# Patient Record
Sex: Female | Born: 1950 | Race: White | Hispanic: Refuse to answer | Smoking: Former smoker
Health system: Southern US, Community
[De-identification: ages and names within clinical notes are randomized; demographics above are authoritative.]

## PROBLEM LIST (undated history)

## (undated) DIAGNOSIS — I10 Essential (primary) hypertension: Secondary | ICD-10-CM

## (undated) DIAGNOSIS — R519 Headache, unspecified: Secondary | ICD-10-CM

## (undated) DIAGNOSIS — F329 Major depressive disorder, single episode, unspecified: Secondary | ICD-10-CM

## (undated) DIAGNOSIS — Z8719 Personal history of other diseases of the digestive system: Secondary | ICD-10-CM

## (undated) DIAGNOSIS — K219 Gastro-esophageal reflux disease without esophagitis: Secondary | ICD-10-CM

## (undated) DIAGNOSIS — G8929 Other chronic pain: Secondary | ICD-10-CM

## (undated) DIAGNOSIS — M8580 Other specified disorders of bone density and structure, unspecified site: Secondary | ICD-10-CM

## (undated) DIAGNOSIS — E669 Obesity, unspecified: Secondary | ICD-10-CM

## (undated) DIAGNOSIS — F32A Depression, unspecified: Secondary | ICD-10-CM

## (undated) DIAGNOSIS — G43909 Migraine, unspecified, not intractable, without status migrainosus: Secondary | ICD-10-CM

## (undated) DIAGNOSIS — E785 Hyperlipidemia, unspecified: Secondary | ICD-10-CM

## (undated) DIAGNOSIS — R51 Headache: Secondary | ICD-10-CM

## (undated) DIAGNOSIS — I480 Paroxysmal atrial fibrillation: Secondary | ICD-10-CM

## (undated) DIAGNOSIS — K5790 Diverticulosis of intestine, part unspecified, without perforation or abscess without bleeding: Secondary | ICD-10-CM

## (undated) DIAGNOSIS — Z9289 Personal history of other medical treatment: Secondary | ICD-10-CM

## (undated) DIAGNOSIS — I5189 Other ill-defined heart diseases: Secondary | ICD-10-CM

## (undated) HISTORY — PX: UPPER GASTROINTESTINAL ENDOSCOPY: SHX188

## (undated) HISTORY — DX: Essential (primary) hypertension: I10

## (undated) HISTORY — PX: COLONOSCOPY: SHX174

## (undated) HISTORY — DX: Migraine, unspecified, not intractable, without status migrainosus: G43.909

## (undated) HISTORY — PX: EYE SURGERY: SHX253

## (undated) HISTORY — DX: Other ill-defined heart diseases: I51.89

## (undated) HISTORY — DX: Depression, unspecified: F32.A

## (undated) HISTORY — DX: Hyperlipidemia, unspecified: E78.5

## (undated) HISTORY — PX: LAPAROSCOPIC NISSEN FUNDOPLICATION: SHX1932

## (undated) HISTORY — DX: Diverticulosis of intestine, part unspecified, without perforation or abscess without bleeding: K57.90

## (undated) HISTORY — PX: CATARACT EXTRACTION, BILATERAL: SHX1313

## (undated) HISTORY — DX: Personal history of other medical treatment: Z92.89

## (undated) HISTORY — PX: TUBAL LIGATION: SHX77

## (undated) HISTORY — DX: Other chronic pain: G89.29

## (undated) HISTORY — DX: Other specified disorders of bone density and structure, unspecified site: M85.80

## (undated) HISTORY — DX: Headache, unspecified: R51.9

## (undated) HISTORY — DX: Major depressive disorder, single episode, unspecified: F32.9

## (undated) HISTORY — DX: Headache: R51

## (undated) HISTORY — DX: Gastro-esophageal reflux disease without esophagitis: K21.9

## (undated) HISTORY — PX: ROTATOR CUFF REPAIR: SHX139

## (undated) HISTORY — DX: Paroxysmal atrial fibrillation: I48.0

## (undated) HISTORY — DX: Personal history of other diseases of the digestive system: Z87.19

## (undated) HISTORY — PX: TONSILLECTOMY: SUR1361

## (undated) HISTORY — PX: ELBOW SURGERY: SHX618

## (undated) HISTORY — DX: Obesity, unspecified: E66.9

---

## 1998-10-07 ENCOUNTER — Other Ambulatory Visit: Admission: RE | Admit: 1998-10-07 | Discharge: 1998-10-07 | Payer: Self-pay | Admitting: Obstetrics and Gynecology

## 1998-10-18 ENCOUNTER — Other Ambulatory Visit: Admission: RE | Admit: 1998-10-18 | Discharge: 1998-10-18 | Payer: Self-pay | Admitting: Obstetrics and Gynecology

## 2000-11-09 ENCOUNTER — Other Ambulatory Visit: Admission: RE | Admit: 2000-11-09 | Discharge: 2000-11-09 | Payer: Self-pay | Admitting: Obstetrics and Gynecology

## 2002-09-12 ENCOUNTER — Other Ambulatory Visit: Admission: RE | Admit: 2002-09-12 | Discharge: 2002-09-12 | Payer: Self-pay | Admitting: Radiology

## 2002-11-06 ENCOUNTER — Other Ambulatory Visit: Admission: RE | Admit: 2002-11-06 | Discharge: 2002-11-06 | Payer: Self-pay | Admitting: Obstetrics and Gynecology

## 2003-09-29 HISTORY — PX: KNEE ARTHROSCOPY: SHX127

## 2004-07-16 ENCOUNTER — Other Ambulatory Visit: Admission: RE | Admit: 2004-07-16 | Discharge: 2004-07-16 | Payer: Self-pay | Admitting: Obstetrics and Gynecology

## 2005-07-30 ENCOUNTER — Other Ambulatory Visit: Admission: RE | Admit: 2005-07-30 | Discharge: 2005-07-30 | Payer: Self-pay | Admitting: Obstetrics and Gynecology

## 2006-09-23 ENCOUNTER — Ambulatory Visit: Payer: Self-pay | Admitting: Gastroenterology

## 2006-09-28 HISTORY — PX: HERNIA REPAIR: SHX51

## 2006-10-14 ENCOUNTER — Encounter (INDEPENDENT_AMBULATORY_CARE_PROVIDER_SITE_OTHER): Payer: Self-pay | Admitting: Specialist

## 2006-10-14 ENCOUNTER — Ambulatory Visit: Payer: Self-pay | Admitting: Gastroenterology

## 2006-10-14 ENCOUNTER — Encounter (INDEPENDENT_AMBULATORY_CARE_PROVIDER_SITE_OTHER): Payer: Self-pay | Admitting: *Deleted

## 2007-01-21 ENCOUNTER — Encounter: Admission: RE | Admit: 2007-01-21 | Discharge: 2007-01-21 | Payer: Self-pay | Admitting: Internal Medicine

## 2007-09-13 ENCOUNTER — Ambulatory Visit: Payer: Self-pay | Admitting: Internal Medicine

## 2007-10-19 ENCOUNTER — Ambulatory Visit: Payer: Self-pay | Admitting: Internal Medicine

## 2007-10-21 ENCOUNTER — Ambulatory Visit (HOSPITAL_COMMUNITY): Admission: RE | Admit: 2007-10-21 | Discharge: 2007-10-21 | Payer: Self-pay | Admitting: Internal Medicine

## 2008-01-05 ENCOUNTER — Inpatient Hospital Stay (HOSPITAL_COMMUNITY): Admission: RE | Admit: 2008-01-05 | Discharge: 2008-01-07 | Payer: Self-pay | Admitting: Surgery

## 2008-07-20 ENCOUNTER — Ambulatory Visit: Payer: Self-pay | Admitting: Internal Medicine

## 2008-10-25 ENCOUNTER — Ambulatory Visit: Payer: Self-pay | Admitting: Internal Medicine

## 2008-12-11 ENCOUNTER — Ambulatory Visit: Payer: Self-pay | Admitting: Internal Medicine

## 2009-03-23 ENCOUNTER — Emergency Department (HOSPITAL_COMMUNITY): Admission: EM | Admit: 2009-03-23 | Discharge: 2009-03-23 | Payer: Self-pay | Admitting: Emergency Medicine

## 2009-06-06 ENCOUNTER — Encounter: Payer: Self-pay | Admitting: Obstetrics and Gynecology

## 2009-06-06 ENCOUNTER — Ambulatory Visit: Payer: Self-pay | Admitting: Obstetrics and Gynecology

## 2009-06-06 ENCOUNTER — Other Ambulatory Visit: Admission: RE | Admit: 2009-06-06 | Discharge: 2009-06-06 | Payer: Self-pay | Admitting: Obstetrics and Gynecology

## 2009-07-12 ENCOUNTER — Ambulatory Visit: Payer: Self-pay | Admitting: Internal Medicine

## 2009-08-26 ENCOUNTER — Encounter: Admission: RE | Admit: 2009-08-26 | Discharge: 2009-09-25 | Payer: Self-pay | Admitting: Cardiology

## 2009-09-03 ENCOUNTER — Telehealth: Payer: Self-pay | Admitting: Internal Medicine

## 2009-09-05 ENCOUNTER — Ambulatory Visit: Payer: Self-pay | Admitting: Internal Medicine

## 2009-09-05 LAB — CONVERTED CEMR LAB
ALT: 29 units/L (ref 0–35)
AST: 29 units/L (ref 0–37)
Alkaline Phosphatase: 28 units/L — ABNORMAL LOW (ref 39–117)
BUN: 19 mg/dL (ref 6–23)
Basophils Absolute: 0 10*3/uL (ref 0.0–0.1)
Basophils Relative: 0.7 % (ref 0.0–3.0)
Calcium: 9.5 mg/dL (ref 8.4–10.5)
Chloride: 105 meq/L (ref 96–112)
Creatinine, Ser: 0.7 mg/dL (ref 0.4–1.2)
Eosinophils Absolute: 0.3 10*3/uL (ref 0.0–0.7)
Hemoglobin: 12.9 g/dL (ref 12.0–15.0)
Lymphocytes Relative: 34.4 % (ref 12.0–46.0)
MCHC: 34.5 g/dL (ref 30.0–36.0)
MCV: 94.6 fL (ref 78.0–100.0)
Monocytes Absolute: 0.5 10*3/uL (ref 0.1–1.0)
Neutro Abs: 3.8 10*3/uL (ref 1.4–7.7)
RDW: 12.2 % (ref 11.5–14.6)
Total Bilirubin: 1.4 mg/dL — ABNORMAL HIGH (ref 0.3–1.2)

## 2009-09-09 ENCOUNTER — Ambulatory Visit (HOSPITAL_COMMUNITY): Admission: RE | Admit: 2009-09-09 | Discharge: 2009-09-09 | Payer: Self-pay | Admitting: Internal Medicine

## 2010-04-16 ENCOUNTER — Ambulatory Visit: Payer: Self-pay | Admitting: Internal Medicine

## 2010-06-03 ENCOUNTER — Ambulatory Visit: Payer: Self-pay | Admitting: Internal Medicine

## 2010-06-12 ENCOUNTER — Ambulatory Visit: Payer: Self-pay | Admitting: Cardiology

## 2010-06-27 ENCOUNTER — Ambulatory Visit: Payer: Self-pay | Admitting: Obstetrics and Gynecology

## 2010-06-27 ENCOUNTER — Other Ambulatory Visit: Admission: RE | Admit: 2010-06-27 | Discharge: 2010-06-27 | Payer: Self-pay | Admitting: Obstetrics and Gynecology

## 2010-08-26 ENCOUNTER — Ambulatory Visit: Payer: Self-pay | Admitting: Internal Medicine

## 2010-12-11 ENCOUNTER — Ambulatory Visit: Payer: Self-pay | Admitting: Nurse Practitioner

## 2011-01-05 ENCOUNTER — Telehealth: Payer: Self-pay | Admitting: Cardiology

## 2011-01-05 NOTE — Telephone Encounter (Signed)
PT NEEDS A STMT SHOWING HOW MUCH SHE HAS PAID. NEEDS IT FOR TAX PURPOSES. CALL PT AT CELL # LISTED AND LET HER KNOW WHEN READY. SHE WANTS TO PICK IT UP.

## 2011-01-05 NOTE — Telephone Encounter (Signed)
Spoke to patient and advised tax info is $50.00 for NMG 2011.  She will need to contact [facility] for 04/28/2010 to present.  Patient ok, will pick up on appt 01/13/2011.

## 2011-01-07 ENCOUNTER — Encounter: Payer: Self-pay | Admitting: Nurse Practitioner

## 2011-01-12 ENCOUNTER — Encounter: Payer: Self-pay | Admitting: *Deleted

## 2011-01-12 ENCOUNTER — Other Ambulatory Visit: Payer: Self-pay | Admitting: *Deleted

## 2011-01-12 DIAGNOSIS — E7800 Pure hypercholesterolemia, unspecified: Secondary | ICD-10-CM

## 2011-01-13 ENCOUNTER — Encounter: Payer: Self-pay | Admitting: Nurse Practitioner

## 2011-01-13 ENCOUNTER — Ambulatory Visit (INDEPENDENT_AMBULATORY_CARE_PROVIDER_SITE_OTHER): Payer: BC Managed Care – PPO | Admitting: Nurse Practitioner

## 2011-01-13 ENCOUNTER — Other Ambulatory Visit (INDEPENDENT_AMBULATORY_CARE_PROVIDER_SITE_OTHER): Payer: BC Managed Care – PPO | Admitting: *Deleted

## 2011-01-13 DIAGNOSIS — I480 Paroxysmal atrial fibrillation: Secondary | ICD-10-CM

## 2011-01-13 DIAGNOSIS — E785 Hyperlipidemia, unspecified: Secondary | ICD-10-CM

## 2011-01-13 DIAGNOSIS — I4891 Unspecified atrial fibrillation: Secondary | ICD-10-CM

## 2011-01-13 DIAGNOSIS — E7800 Pure hypercholesterolemia, unspecified: Secondary | ICD-10-CM

## 2011-01-13 DIAGNOSIS — E669 Obesity, unspecified: Secondary | ICD-10-CM

## 2011-01-13 DIAGNOSIS — I10 Essential (primary) hypertension: Secondary | ICD-10-CM

## 2011-01-13 LAB — LIPID PANEL
Cholesterol: 181 mg/dL (ref 0–200)
HDL: 60.3 mg/dL (ref >39.00)
LDL Cholesterol: 94 mg/dL (ref 0–99)
Total CHOL/HDL Ratio: 3
Triglycerides: 133 mg/dL (ref 0.0–149.0)
VLDL: 26.6 mg/dL (ref 0.0–40.0)

## 2011-01-13 LAB — BASIC METABOLIC PANEL
BUN: 16 mg/dL (ref 6–23)
CO2: 29 mEq/L (ref 19–32)
Calcium: 9.3 mg/dL (ref 8.4–10.5)
Chloride: 107 mEq/L (ref 96–112)
Creatinine, Ser: 0.6 mg/dL (ref 0.4–1.2)
GFR: 100.61 mL/min (ref >60.00)
Glucose, Bld: 91 mg/dL (ref 70–99)
Potassium: 4.7 mEq/L (ref 3.5–5.1)
Sodium: 143 mEq/L (ref 135–145)

## 2011-01-13 LAB — HEPATIC FUNCTION PANEL
ALT: 22 U/L (ref 0–35)
AST: 23 U/L (ref 0–37)
Albumin: 3.7 g/dL (ref 3.5–5.2)
Alkaline Phosphatase: 35 U/L — ABNORMAL LOW (ref 39–117)
Bilirubin, Direct: 0.1 mg/dL (ref 0.0–0.3)
Total Bilirubin: 1 mg/dL (ref 0.3–1.2)
Total Protein: 6.5 g/dL (ref 6.0–8.3)

## 2011-01-13 NOTE — Assessment & Plan Note (Signed)
Blood pressure looks good and is good at home. She will continue with her current medicines.

## 2011-01-13 NOTE — Patient Instructions (Signed)
Stay on your same medicines. I encourage water exercise. Monitor blood pressure at home. We will see you back in 6 months. I will have you see Dr. Jens Som.

## 2011-01-13 NOTE — Assessment & Plan Note (Signed)
She is gaining weight. I encouraged water exercise.

## 2011-01-13 NOTE — Assessment & Plan Note (Signed)
No recurrence. Will continue to manage medically.

## 2011-01-13 NOTE — Progress Notes (Signed)
[name] Date of Birth: [birth year]   History of Present Illness: Olivia Reynolds is seen today for her 6 month visit. She is doing well. Unfortunately, she developed plantar fasciitis and has not been able to exercise for the past 4 months. Her weight is going back up. Her blood pressure is good at home. She has not had recurrent atrial fib. She has weaned off her Welbutrin and is now on low dose Zoloft.  Current Outpatient Prescriptions on File Prior to Visit  Medication Sig Dispense Refill  . ALPRAZolam (XANAX) 0.25 MG tablet Take 0.25 mg by mouth at bedtime as needed.        Marland Kitchen aspirin 81 MG tablet Take 81 mg by mouth daily.        Marland Kitchen losartan (COZAAR) 50 MG tablet Take 50 mg by mouth daily.        . metoprolol (TOPROL-XL) 50 MG 24 hr tablet Take 50 mg by mouth 2 (two) times daily.        . rosuvastatin (CRESTOR) 10 MG tablet Take 10 mg by mouth daily.        . sertraline (ZOLOFT) 50 MG tablet Take 50 mg by mouth daily.        . Wheat Dextrin (BENEFIBER DRINK MIX PO) Take by mouth daily.        Marland Kitchen buPROPion (WELLBUTRIN XL) 150 MG 24 hr tablet Take 150 mg by mouth daily.        . Calcium Carbonate (CALTRATE 600 PO) Take by mouth daily.        Marland Kitchen DISCONTD: valsartan (DIOVAN) 160 MG tablet Take 160 mg by mouth daily.          Allergies  Allergen Reactions  . Codeine   . Erythromycin   . Statins     Past Medical History  Diagnosis Date  . PAF (paroxysmal atrial fibrillation)   . Hyperlipidemia   . HTN (hypertension)   . Aortic valve calcification     Mild per echo in February of 2010  . Obesity   . History of hiatal hernia     with repair  . Ventricular hypertrophy     (L) Mild  . Diverticulosis   . Chronic headaches   . Depression   . Atrial fibrillation   . GERD (gastroesophageal reflux disease)     Past Surgical History  Procedure Date  . Hernia repair   . Eye surgery     L-yey myopia  . Knee arthroscopy 2005    R-knee  . Elbow surgery     Tendon Surgery  . Tubal  ligation   . Tonsillectomy     History  Smoking status  . Never Smoker   Smokeless tobacco  . Never Used    History  Alcohol Use  . Yes    once a month    Family History  Problem Relation Age of Onset  . COPD Mother   . Asthma Mother   . Arthritis Father   . Hypertension Father   . Stroke Father   . Cancer Father   . Diabetes      sibling    Review of Systems: The review of systems is positive for foot pain. She has had 2 injections with poor relief. Weight is going up. No chest pain or shortness of breath is noted. She does have a tendency towards depression.  All other systems were reviewed and are negative.  Physical Exam: BP 120/82  Pulse 70  Ht  5\' 5"  (1.651 m)  Wt 175 lb (79.379 kg)  BMI 29.12 kg/m2 Patient is very pleasant and in no acute distress. Skin is warm and dry. Color is normal.  HEENT is unremarkable. Normocephalic/atraumatic. PERRL. Sclera are nonicteric. Neck is supple. No masses. No JVD. Lungs are clear. Cardiac exam shows a regular rate and rhythm. Abdomen is soft. Extremities are without edema. Gait and ROM are intact. No gross neurologic deficits noted.  LABORATORY DATA:   Assessment / Plan:

## 2011-01-15 ENCOUNTER — Telehealth: Payer: Self-pay | Admitting: *Deleted

## 2011-01-15 NOTE — Telephone Encounter (Signed)
Message copied by Barnetta Hammersmith on Thu Jan 15, 2011  8:24 AM ------      Message from: Norma Fredrickson      Created: Tue Jan 13, 2011  3:57 PM       Ok to report. Labs are satisfactory. Recheck in 6 months.

## 2011-01-15 NOTE — Telephone Encounter (Signed)
Pt notified of lab results and to continue same medications.  Pt will f/u with Dr. Crenshaw in six months with labs.   

## 2011-01-28 ENCOUNTER — Other Ambulatory Visit: Payer: Self-pay | Admitting: *Deleted

## 2011-01-28 DIAGNOSIS — E785 Hyperlipidemia, unspecified: Secondary | ICD-10-CM

## 2011-01-28 MED ORDER — ROSUVASTATIN CALCIUM 10 MG PO TABS: 10.0000 mg | ORAL_TABLET | Freq: Every day | ORAL | Status: DC

## 2011-02-10 NOTE — Op Note (Signed)
NAMEEUSEBIA, GRULKE               ACCOUNT NO.:  [id number]   MEDICAL RECORD NO.:  [id number]          PATIENT TYPE:  INP   LOCATION:  1525                         FACILITY:  [facility]   PHYSICIAN:  Thornton Park. Daphine Deutscher, MD  DATE OF BIRTH:  10/10/1950   DATE OF PROCEDURE:  01/05/2008  DATE OF DISCHARGE:                               OPERATIVE REPORT   PREOPERATIVE DIAGNOSIS:  Large type 3 mixed hiatal hernia containing  approximately half of the stomach.   POSTOPERATIVE DIAGNOSIS:  Gastroesophageal reflux disease with large  type 3 mixed hiatal hernia containing about half of the stomach.   PROCEDURE:  Laparoscopic takedown of gastric incarceration and hiatal  hernia, repair of diaphragm with five pledgeted sutures posteriorly and  onlay of Surgisis mesh sutured in place around the defect, Nissen  fundoplication over a #56 lighted bougie dilator.   SURGEON:  Thornton Park. Daphine Deutscher, M.D.   ASSISTANT:  Sharlet Salina T. Hoxworth, M.D.   ANESTHESIA:  General endotracheal anesthesia.   ESTIMATED BLOOD LOSS:  Minimal.   DESCRIPTION OF PROCEDURE:  Ms. Keirstyn Olivia Reynolds is a 60 year old white  female who has had reflux and pressure symptoms in her chest and was  found to have a large hiatal hernia.  She was taken to room 1 and given  general anesthesia.  The abdomen was prepped with TechniCare and draped  sterilely.  Access to the abdomen was gained through the left upper  quadrant with the OptiVu technique.  This entered into the abdomen and  was insufflated.  A total of six trocar sites were used.  I had to take  down some adhesions to the left anterior abdominal wall lateral to this  trocar placement to allow me to insert a 5 mm trocar.  The hernia was  visualized and contained, as demonstrated on the upper GI, about half  the stomach.  We were able to grasp this and bring it down into the  abdomen and in so doing, I went ahead and started along the greater  curvature and with the Harmonic scalpel,  divided the peritoneal  attachments to the hernia sac.  I also freed the stomach from the  spleen.  There was one large splenic vessel that I double clipped and  divided with the harmonic.  I worked my way up into the chest to the  esophagus.   Next, I began on the patient's right side and defined the right crural  margin and, again, took the hernia sac and then resected it with the  specimen.  I identified the posterior vagal nerve and I did not get down  that deep anteriorly to sacrifice the anterior vagal nerve.  I feel like  the vagal nerves were preserved.  A Penrose drain was then placed about  the esophagus which appeared to have a good length.  Dr. Johna Sheriff  introduced the endoscope from above and this enabled [place] to validate the  anatomy and her anatomic impressions which were correct in terms of the  EG junction   At that point, we began repair posteriorly with a total of five  pledgeted  sutures using a combination Endostitch and free stitches,  tying them some with extracorporeal ties and also some with tie knots.  This remarkably reduced the hiatal hernia defect to the point where it  was snug around the esophagus with a 56 in place.  Once we had this nice  closure there, I then went ahead and got a piece of the Surgisis mesh  that is cut for the diaphragm and is a hiatal patch.  It is cut  asymmetrically with the larger side on the patient's right side.  This  was then placed around the distal esophagus and I actually cut it a  little bit more so that it would overlap anteriorly.  I sutured it  together and then sutured it to the diaphragm superiorly and tacked it  on both the right and left sides and tacked it up anteriorly laterally  as well, so it was secured in five places to the diaphragm being secured  to itself.   I then reached around and grabbed the cardia and Dr. Rica Mast passed a 56  lighted bougie and around that we invaginated the distal esophagus with  the  wrapped stomach, suturing it to the esophagus, again with an  Endostitch and then two free sutures, again all three with tie knots.  The wrap looked very good.  I put Tisseel beneath it and removed the 56  lighted bougie without difficulty.  We had seen a little bleeding from  the left lateral segment which we held some pressure on, but that was  very minimal.  The area was surveyed and everything looked to be in  order.  We deflated the abdomen and removed the trocars and  removed the  Nathanson retractor from the liver and injected all the ports with  Marcaine and closed them with 4-0 Vicryls.  The skin was closed with  Dermabond.  The patient tolerated the procedure well and was taken to  the recovery room in satisfactory condition.      Thornton Park Daphine Deutscher, MD  Electronically Signed     MBM/MEDQ  D:  01/05/2008  T:  01/05/2008  Job:  [id number]   cc:   Iva Boop, MD,FACG  [facility]  [address] [place], [place] [address]   Luanna Cole. Lenord Fellers, M.D.  Fax: [phone]

## 2011-02-10 NOTE — Assessment & Plan Note (Signed)
[facility]                         GASTROENTEROLOGY OFFICE NOTE   NAME:Olivia Reynolds, Olivia Reynolds                      MRN:          [id number]  DATE:09/13/2007                            DOB:          [birth year]    REFERRING PHYSICIAN:  Colleen Can. Deborah Chalk, M.D.   PRIMARY CARE Myiesha Edgar:  Luanna Cole. Lenord Fellers, M.D.   REASON FOR CONSULTATION:  Hiatal hernia.   ASSESSMENT:  It sounds like she has a large hiatal hernia which may be  causing some chest pressure.  She saw Dr. Deborah Chalk for chest pressure and  had some cardiac testing in the monitor, and apparently she had some  transient atrial fibrillation.  He noted on x-ray or perhaps an  echocardiogram that she had a large hiatus hernia.  She has never had  dysphagia or significant indigestion.  She describes a postprandial-like  pressure sensation at times, but it is not always associated with  eating.   I think it is very possible that a large hiatal hernia could be causing  problems.  I do not have any direct imaging to look at with respect to  this.   PLAN:  Proceed to upper GI endoscopy, and then also an upper GI series  may be necessary.  If indeed she has a very large hiatus hernia, that  may need surgical correction.  Interestingly, her mother had this  problem and became more symptomatic as she grew older and then became  too old to have surgery.   I have explained the risks, benefits, and indications of the procedure  to the patient.  She understands and agrees to proceed.   HISTORY:  This is a 60 year old white woman with problems as outlined  above.  She had some chest tightness and pressure and had palpitations  and dizziness.  Workup as described above, though I do not have those  exact records.  The symptoms last for about a half hour or 45 minutes.  GI review of systems otherwise negative.   PAST MEDICAL HISTORY:  1. Hypertension.  2. She is on Protonix for gastroesophageal reflux disease, so  she does      not have heartburn now.  3. Colonoscopy, October 14, 2006.  A 5 mm hyperplastic polyp removed      by Dr. Corinda Gubler.  Also showing diverticulosis.  Previous      colonoscopy, April 2004, showing a 7 mm sessile polyp and a 2 mm      sessile polyp.  The 7 mm polyp was removed.  She also had the      diverticulosis.  I do not have the pathology from that report.  4. Overweight.  5. Chronic headaches.  6. Depression.  7. Atrial fibrillation, apparently paroxysmal.  8. Dyslipidemia.  9. Prior tubal ligation.   FAMILY HISTORY:  Prostate cancer in her father.  Mother had heart  disease.  Her father and siblings have had colon polyps.  She has a  daughter with Crohn's disease.   MEDICATIONS:  1. Zoloft 50 mg daily.  2. Diovan 160 mg daily.  3. Lasix 20 mg daily.  4. Protonix 40 mg daily.  5. Crestor 20 mg daily.  6. Toprol-XL 25 mg daily.  7. Aspirin 81 mg daily.   DRUG ALLERGIES:  1. ERYTHROMYCIN.  2. CODEINE.   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  She is married, with three children.  Rare  alcohol.  No tobacco or drugs.   REVIEW OF SYSTEMS:  See medical history form for full details.  She has  some cough, allergy problems.  She has had some dyspnea.  She has some  joint pains.  All other systems are negative.   PHYSICAL EXAMINATION:  GENERAL:  Reveals a well-developed, overweight  woman.  VITAL SIGNS:  Height 5 feet 5 inches, weight 178 pounds, blood pressure  110/70, pulse 70.  Her BMI is actually 29.6, which puts her in the  overweight category.  EYES:  Anicteric.  ENT:  Normal mouth, posterior pharynx.  NECK:  Supple.  No thyromegaly or mass.  CHEST:  Clear.  HEART:  S1, S2.  No murmurs, rubs, or gallops.  ABDOMEN:  Soft, nontender.  No organomegaly or mass.  EXTREMITIES:  Free of edema.  SKIN:  No rash.  PSYCHIATRIC:  She is alert and oriented x3.  NECK:  No neck or supraclavicular nodes.   NEUROLOGIC:  She is grossly nonfocal.   I appreciate the opportunity to care for this patient.     Iva Boop, MD,FACG  Electronically Signed    CEG/MedQ  DD: 09/13/2007  DT: 09/14/2007  Job #: [id number]   cc:   Colleen Can. Deborah Chalk, M.D.  Luanna Cole. Lenord Fellers, M.D.

## 2011-04-09 ENCOUNTER — Telehealth: Payer: Self-pay | Admitting: Internal Medicine

## 2011-04-09 MED ORDER — SERTRALINE HCL 50 MG PO TABS: 50.0000 mg | ORAL_TABLET | Freq: Every day | ORAL | Status: DC

## 2011-04-09 NOTE — Telephone Encounter (Signed)
Please refill Zoloft 50 mg one tab daily prn one year to [facility]. Pt has taken this for years.

## 2011-06-04 ENCOUNTER — Other Ambulatory Visit: Payer: Self-pay | Admitting: *Deleted

## 2011-06-04 MED ORDER — LOSARTAN POTASSIUM 50 MG PO TABS: 50.0000 mg | ORAL_TABLET | Freq: Every day | ORAL | Status: DC

## 2011-06-04 NOTE — Telephone Encounter (Signed)
Fax received from pharmacy. Refill completed. Jodette Aldon Hengst RN  

## 2011-06-20 ENCOUNTER — Other Ambulatory Visit: Payer: Self-pay | Admitting: Nurse Practitioner

## 2011-06-20 ENCOUNTER — Other Ambulatory Visit: Payer: Self-pay | Admitting: Internal Medicine

## 2011-06-22 ENCOUNTER — Other Ambulatory Visit: Payer: Self-pay | Admitting: Internal Medicine

## 2011-06-22 NOTE — Telephone Encounter (Signed)
escribe medication per fax request  

## 2011-06-23 LAB — BASIC METABOLIC PANEL
BUN: 16
Chloride: 108
GFR calc non Af Amer: >60
Glucose, Bld: 121 — ABNORMAL HIGH
Potassium: 4.4
Sodium: 145

## 2011-06-23 LAB — CBC
HCT: 32 — ABNORMAL LOW
Hemoglobin: 11.1 — ABNORMAL LOW
MCV: 89.1
RBC: 3.59 — ABNORMAL LOW
WBC: 12.2 — ABNORMAL HIGH

## 2011-06-23 LAB — HEMOGLOBIN AND HEMATOCRIT, BLOOD: HCT: 36

## 2011-07-13 ENCOUNTER — Ambulatory Visit (INDEPENDENT_AMBULATORY_CARE_PROVIDER_SITE_OTHER): Payer: BC Managed Care – PPO | Admitting: Cardiology

## 2011-07-13 ENCOUNTER — Encounter: Payer: Self-pay | Admitting: Cardiology

## 2011-07-13 VITALS — BP 131/79 | HR 73 | Ht 65.0 in | Wt 183.0 lb

## 2011-07-13 DIAGNOSIS — E785 Hyperlipidemia, unspecified: Secondary | ICD-10-CM

## 2011-07-13 DIAGNOSIS — I10 Essential (primary) hypertension: Secondary | ICD-10-CM

## 2011-07-13 DIAGNOSIS — I4891 Unspecified atrial fibrillation: Secondary | ICD-10-CM

## 2011-07-13 DIAGNOSIS — I480 Paroxysmal atrial fibrillation: Secondary | ICD-10-CM

## 2011-07-13 NOTE — Patient Instructions (Signed)
Your physician wants you to follow-up in: 6 MONTHS You will receive a reminder letter in the mail two months in advance. If you don't receive a letter, please call our office to schedule the follow-up appointment. 

## 2011-07-13 NOTE — Assessment & Plan Note (Signed)
Blood pressure is controlled on present medications. Will continue.

## 2011-07-13 NOTE — Progress Notes (Signed)
HPI:60 yo female previously followed by Dr. Deborah Chalk for fu of PAF. Echocardiogram in February of 2010 showed normal LV function, mild left ventricular hypertrophy with impaired relaxation and mild aortic valve calcification. Nuclear study in February of 2010 showed an ejection fraction of 75% and normal perfusion. Patient last seen in April 2012. Since then, she has occasional episodes of atrial fibrillation. These are sudden in onset and described as palpitations associated with some shortness of breath and chest pain. There is also some fatigue. They typically last 10-15 minutes and resolved spontaneously. She otherwise has mild dyspnea on exertion but no orthopnea, PND, pedal edema, exertional chest pain or syncope. No bleeding.  Current Outpatient Prescriptions  Medication Sig Dispense Refill  . ALPRAZolam (XANAX) 0.25 MG tablet Take 0.25 mg by mouth at bedtime as needed.        Marland Kitchen aspirin 81 MG tablet Take 81 mg by mouth daily.        Marland Kitchen LORazepam (ATIVAN) 2 MG tablet TAKE 1 TABLET BY MOUTH EVERY NIGHT AT BEDTIME  90 tablet  0  . losartan (COZAAR) 50 MG tablet Take 1 tablet (50 mg total) by mouth daily.  30 tablet  2  . metoprolol (TOPROL-XL) 50 MG 24 hr tablet TAKE 1 TABLET BY MOUTH TWICE DAILY  60 tablet  3  . rosuvastatin (CRESTOR) 10 MG tablet Take 1 tablet (10 mg total) by mouth daily.  30 tablet  6  . sertraline (ZOLOFT) 50 MG tablet Take 1 tablet (50 mg total) by mouth daily.  30 tablet  PRN     Past Medical History  Diagnosis Date  . PAF (paroxysmal atrial fibrillation)   . Hyperlipidemia   . HTN (hypertension)   . Aortic valve calcification     Mild per echo in February of 2010  . Obesity   . History of hiatal hernia     with repair  . Diverticulosis   . Chronic headaches   . Depression   . GERD (gastroesophageal reflux disease)     Past Surgical History  Procedure Date  . Hernia repair   . Eye surgery     L-yey myopia  . Knee arthroscopy 2005    R-knee  . Elbow  surgery     Tendon Surgery  . Tubal ligation   . Tonsillectomy     History   Social History  . Marital Status: Legally Separated    Spouse Name: N/A    Number of Children: N/A  . Years of Education: N/A   Occupational History  . Not on file.   Social History Main Topics  . Smoking status: Never Smoker   . Smokeless tobacco: Never Used  . Alcohol Use: Yes     once a month  . Drug Use: No  . Sexually Active:    Other Topics Concern  . Not on file   Social History Narrative  . No narrative on file    ROS: no fevers or chills, productive cough, hemoptysis, dysphasia, odynophagia, melena, hematochezia, dysuria, hematuria, rash, seizure activity, orthopnea, PND, pedal edema, claudication. Remaining systems are negative.  Physical Exam: Well-developed well-nourished in no acute distress.  Skin is warm and dry.  HEENT is normal.  Neck is supple. No thyromegaly.  Chest is clear to auscultation with normal expansion.  Cardiovascular exam is regular rate and rhythm.  Abdominal exam nontender or distended. No masses palpated. Extremities show no edema. neuro grossly intact  ECG NSR with no ST changes; RAD

## 2011-07-13 NOTE — Assessment & Plan Note (Signed)
The patient has a long history of paroxysmal atrial fibrillation. Her LV function is normal. Her bouts occur every 2-3 months. Continue beta blocker. Will consider antiarrhythmic in the future if her episodes become more frequent. Long discussion today concerning risk of CVA. Her Chads vas score is 2 for female sex and hypertension. I have recommended pradaxa or xeralto and discontinuation of ASA. She would like to consider these options before implementing. She will contact us with her choice if she is agreeable in the future. Note I spent 30 minutes in the office today discussing the above. I also spent 15 minutes reviewing her previous chart and record.

## 2011-07-13 NOTE — Assessment & Plan Note (Signed)
Continue statin. 

## 2011-08-22 ENCOUNTER — Other Ambulatory Visit: Payer: Self-pay | Admitting: Cardiology

## 2011-08-24 ENCOUNTER — Other Ambulatory Visit: Payer: Self-pay | Admitting: *Deleted

## 2011-08-24 MED ORDER — ROSUVASTATIN CALCIUM 10 MG PO TABS: 10.0000 mg | ORAL_TABLET | Freq: Every day | ORAL | Status: DC

## 2011-08-26 ENCOUNTER — Other Ambulatory Visit: Payer: Self-pay | Admitting: Cardiology

## 2011-08-27 ENCOUNTER — Other Ambulatory Visit: Payer: Self-pay

## 2011-09-07 ENCOUNTER — Other Ambulatory Visit: Payer: Self-pay | Admitting: *Deleted

## 2011-09-07 ENCOUNTER — Other Ambulatory Visit: Payer: Self-pay | Admitting: Cardiology

## 2011-09-07 MED ORDER — METOPROLOL SUCCINATE ER 50 MG PO TB24: 50.0000 mg | ORAL_TABLET | Freq: Two times a day (BID) | ORAL | Status: DC

## 2011-09-07 MED ORDER — LOSARTAN POTASSIUM 50 MG PO TABS: 50.0000 mg | ORAL_TABLET | Freq: Every day | ORAL | Status: DC

## 2011-09-07 NOTE — Telephone Encounter (Signed)
Pt would like a 90 day supply and she has one left of each.

## 2011-09-07 NOTE — Telephone Encounter (Signed)
Used the fax machine to refill med

## 2011-09-11 ENCOUNTER — Encounter: Payer: Self-pay | Admitting: Internal Medicine

## 2011-10-13 ENCOUNTER — Telehealth: Payer: Self-pay | Admitting: Internal Medicine

## 2011-10-13 MED ORDER — LORAZEPAM 2 MG PO TABS: 2.0000 mg | ORAL_TABLET | Freq: Every evening | ORAL | Status: AC | PRN

## 2011-10-13 NOTE — Telephone Encounter (Signed)
Call in Ativan 2mg  qhs (#30) generic with no refill please.

## 2011-10-15 ENCOUNTER — Encounter: Payer: Self-pay | Admitting: Internal Medicine

## 2011-10-16 ENCOUNTER — Ambulatory Visit: Payer: BC Managed Care – PPO | Admitting: Internal Medicine

## 2011-10-16 ENCOUNTER — Encounter: Payer: Self-pay | Admitting: Internal Medicine

## 2011-10-16 ENCOUNTER — Ambulatory Visit (INDEPENDENT_AMBULATORY_CARE_PROVIDER_SITE_OTHER): Payer: BC Managed Care – PPO | Admitting: Internal Medicine

## 2011-10-16 DIAGNOSIS — F329 Major depressive disorder, single episode, unspecified: Secondary | ICD-10-CM

## 2011-10-16 DIAGNOSIS — F3289 Other specified depressive episodes: Secondary | ICD-10-CM

## 2011-10-16 DIAGNOSIS — G43909 Migraine, unspecified, not intractable, without status migrainosus: Secondary | ICD-10-CM

## 2011-10-16 DIAGNOSIS — F32A Depression, unspecified: Secondary | ICD-10-CM

## 2011-10-16 DIAGNOSIS — G4700 Insomnia, unspecified: Secondary | ICD-10-CM

## 2011-10-25 ENCOUNTER — Encounter: Payer: Self-pay | Admitting: Internal Medicine

## 2011-10-25 DIAGNOSIS — G4700 Insomnia, unspecified: Secondary | ICD-10-CM | POA: Insufficient documentation

## 2011-10-25 DIAGNOSIS — F329 Major depressive disorder, single episode, unspecified: Secondary | ICD-10-CM | POA: Insufficient documentation

## 2011-10-25 DIAGNOSIS — F32A Depression, unspecified: Secondary | ICD-10-CM | POA: Insufficient documentation

## 2011-10-25 NOTE — Progress Notes (Signed)
  Subjective:    Patient ID: Olivia Reynolds, female    DOB: [birth year], 61 y.o.   MRN: [id number]  HPI She and her husband have divorced. He is keeping their home in [facility] [place] [facility]. She is moving to a new condominium on [address]. She is excited about her new home but has been stressed out because she's had to pack. Packing stimulated a lot of old memories. She's anxious about being on her own. Has not been sleeping well for a number of nights. We called in Ativan 2 mg at bedtime for her but she is yet to pick it up. Has had occipital headache. She does have a prior history of migraine headache. Cannot give any really from headaches by taking multiple over-the-counter preparations. History of hypertension and hyperlipidemia. History of depression.    Review of Systems     Objective:   Physical Exam HEENT exam: PERRLA; funduscopic is benign, disc are sharp and flat bilaterally; TMs are clear; pharynx is clear; chest clear; brief neurological exam shows no focal deficits. No carotid bruits.        Assessment & Plan:  Anxiety  Insomnia  History of depression  Occipital headache  Plan: Sterapred DS 10 mg 6 day dosepak, Vicodin 5/500 one by mouth every 4-6 hours when necessary headache; continue SSRI antidepressant; stopped Xanax, try Ativan 2 mg at bedtime.

## 2011-10-25 NOTE — Patient Instructions (Signed)
Take Sterapred DS 10 mg 6 day dosepak as directed. Take Vicodin 5/500 every 6 hours needed for headache. Take Ativan 2 mg at bedtime instead of Xanax for sleep.

## 2011-11-25 ENCOUNTER — Other Ambulatory Visit: Payer: Self-pay | Admitting: Internal Medicine

## 2011-11-26 ENCOUNTER — Other Ambulatory Visit: Payer: Self-pay

## 2011-11-26 MED ORDER — LORAZEPAM 2 MG PO TABS: 2.0000 mg | ORAL_TABLET | Freq: Every evening | ORAL | Status: AC | PRN

## 2012-01-14 ENCOUNTER — Ambulatory Visit (INDEPENDENT_AMBULATORY_CARE_PROVIDER_SITE_OTHER): Payer: BC Managed Care – PPO | Admitting: Internal Medicine

## 2012-01-14 ENCOUNTER — Encounter: Payer: Self-pay | Admitting: Internal Medicine

## 2012-01-14 ENCOUNTER — Telehealth: Payer: Self-pay | Admitting: Internal Medicine

## 2012-01-14 VITALS — BP 138/72 | HR 72 | Temp 98.2°F | Resp 16 | Ht 65.0 in | Wt 182.0 lb

## 2012-01-14 DIAGNOSIS — L259 Unspecified contact dermatitis, unspecified cause: Secondary | ICD-10-CM

## 2012-01-14 MED ORDER — METHYLPREDNISOLONE ACETATE 80 MG/ML IJ SUSP: 80.0000 mg | Freq: Once | INTRAMUSCULAR | Status: DC

## 2012-01-14 NOTE — Progress Notes (Signed)
  Subjective:    Patient ID: Olivia Reynolds, female    DOB: [birth year], 61 y.o.   MRN: [id number]  HPI 61 year-old white female who has rash on lower abdomen between her breasts, in the olecranon fossa bilaterally. She moved into a new house recently. She has new carpet and was climbing up in the attic being exposed to insulation. Not really doing much yard work. His been itching a lot. Tried witch hazel and Sarna lotion without relief. No recent illness. No recent illness such as sore throat or fever.    Review of Systems     Objective:   Physical Exam she has some generalized erythema with a few excoriations lower abdomen below the umbilicus. That skin area is quite dry. Macular papular rash between breasts. No intertrigo. Bilateral olecranon fossa have small papules that are erythematous. No other rashes noted.        Assessment & Plan:  Probable contact dermatitis  Plan: Triamcinolone cream 0.1% 60 g in 4 ounces of use or endocrine to apply 3 times a day to rash. Atarax 25 mg (#60) 1 or 2 by mouth 4 times a day when necessary itching with 1 refill. Depo-Medrol 80 mg IM given today.

## 2012-01-14 NOTE — Patient Instructions (Addendum)
Use triamcinolone cream and Eucerin cream on rash 3 times a day. Take Atarax one or 2 tablets 3-4 times a day as needed for itching. You have been given an injection of Depo-Medrol IM today. Call if not better next week.

## 2012-01-14 NOTE — Telephone Encounter (Signed)
See at 2 pm 

## 2012-01-25 MED ORDER — METHYLPREDNISOLONE ACETATE 80 MG/ML IJ SUSP
80.0000 mg | Freq: Once | INTRAMUSCULAR | Status: AC
  Administered 2012-01-25: 80 mg via INTRAMUSCULAR

## 2012-01-25 NOTE — Progress Notes (Signed)
Addended by: Judy Pimple on: 01/25/2012 02:46 PM   Modules accepted: Orders

## 2012-02-29 ENCOUNTER — Other Ambulatory Visit: Payer: Self-pay | Admitting: Internal Medicine

## 2012-02-29 ENCOUNTER — Other Ambulatory Visit: Payer: Self-pay

## 2012-03-18 ENCOUNTER — Ambulatory Visit (INDEPENDENT_AMBULATORY_CARE_PROVIDER_SITE_OTHER): Payer: BC Managed Care – PPO | Admitting: Cardiology

## 2012-03-18 ENCOUNTER — Encounter: Payer: Self-pay | Admitting: Cardiology

## 2012-03-18 VITALS — BP 134/76 | HR 71 | Ht 65.0 in | Wt 186.4 lb

## 2012-03-18 DIAGNOSIS — I10 Essential (primary) hypertension: Secondary | ICD-10-CM

## 2012-03-18 DIAGNOSIS — E785 Hyperlipidemia, unspecified: Secondary | ICD-10-CM

## 2012-03-18 DIAGNOSIS — I4891 Unspecified atrial fibrillation: Secondary | ICD-10-CM

## 2012-03-18 DIAGNOSIS — I480 Paroxysmal atrial fibrillation: Secondary | ICD-10-CM

## 2012-03-18 NOTE — Patient Instructions (Addendum)
Your physician wants you to follow-up in: one year with dr crenshaw You will receive a reminder letter in the mail two months in advance. If you don't receive a letter, please call our office to schedule the follow-up appointment.  

## 2012-03-18 NOTE — Assessment & Plan Note (Signed)
Patient remains in sinus rhythm. She has had brief episodes of palpitations lasting several minutes that are similar to her atrial fibrillation previously. These occur approximately twice monthly with no associated symptoms and are not particularly bothersome. We will therefore continue with rate control. We will consider antiarrhythmic in the future if needed. Continue aspirin. Long discussion concerning anticoagulation. She has embolic risk factors of hypertension and female sex. I recommended Pradaxa but she declined. She understands the higher risk of CVA.

## 2012-03-18 NOTE — Progress Notes (Signed)
HPI: Pleasant female for fu of PAF. Echocardiogram in February of 2010 showed normal LV function, mild left ventricular hypertrophy with impaired relaxation and mild aortic valve calcification. Nuclear study in February of 2010 showed an ejection fraction of 75% and normal perfusion. Patient last seen in Oct 2012. At that time we discussed discontinuing aspirin and beginning either pradaxa or xeralto but she continued with aspirin. Since last saw her, the patient has dyspnea with more extreme activities but not with routine activities. It is relieved with rest. It is not associated with chest pain. There is no orthopnea, PND or pedal edema. There is no syncope or palpitations. There is no exertional chest pain.    Current Outpatient Prescriptions  Medication Sig Dispense Refill  . aspirin 81 MG tablet Take 81 mg by mouth daily.        . CRESTOR 10 MG tablet TAKE ONE TABLET BY MOUTH DAILY  30 tablet  8  . LORazepam (ATIVAN) 2 MG tablet TAKE 1 TABLET BY MOUTH EVERY NIGHT AT BEDTIME  30 tablet  0  . losartan (COZAAR) 50 MG tablet Take 1 tablet (50 mg total) by mouth daily.  90 tablet  2  . meloxicam (MOBIC) 15 MG tablet Take 15 mg by mouth daily.      . metoprolol (TOPROL-XL) 50 MG 24 hr tablet Take 1 tablet (50 mg total) by mouth 2 (two) times daily.  180 tablet  3  . rosuvastatin (CRESTOR) 10 MG tablet Take 1 tablet (10 mg total) by mouth daily.  30 tablet  12  . sertraline (ZOLOFT) 50 MG tablet Take 1 tablet (50 mg total) by mouth daily.  30 tablet  PRN     Past Medical History  Diagnosis Date  . PAF (paroxysmal atrial fibrillation)   . Hyperlipidemia   . HTN (hypertension)   . Aortic valve calcification     Mild per echo in February of 2010  . Obesity   . History of hiatal hernia     with repair  . Diverticulosis   . Chronic headaches   . Depression   . GERD (gastroesophageal reflux disease)     Past Surgical History  Procedure Date  . Hernia repair   . Eye surgery     L-yey  myopia  . Knee arthroscopy 2005    R-knee  . Elbow surgery     Tendon Surgery  . Tubal ligation   . Tonsillectomy     History   Social History  . Marital Status: Legally Separated    Spouse Name: N/A    Number of Children: N/A  . Years of Education: N/A   Occupational History  . Not on file.   Social History Main Topics  . Smoking status: Former Smoker    Types: Cigarettes  . Smokeless tobacco: Never Used  . Alcohol Use: Yes     once a month  . Drug Use: No  . Sexually Active: Not on file   Other Topics Concern  . Not on file   Social History Narrative  . No narrative on file    ROS: knee arthralgias but no fevers or chills, productive cough, hemoptysis, dysphasia, odynophagia, melena, hematochezia, dysuria, hematuria, rash, seizure activity, orthopnea, PND, pedal edema, claudication. Remaining systems are negative.  Physical Exam: Well-developed well-nourished in no acute distress.  Skin is warm and dry.  HEENT is normal.  Neck is supple.  Chest is clear to auscultation with normal expansion.  Cardiovascular exam is regular  rate and rhythm.  Abdominal exam nontender or distended. No masses palpated. Extremities show no edema. neuro grossly intact  ECG sinus rhythm at a rate of 71. No ST changes.

## 2012-03-18 NOTE — Assessment & Plan Note (Signed)
Blood pressure controlled. Continue present medications. 

## 2012-03-18 NOTE — Assessment & Plan Note (Signed)
Management per primary care. 

## 2012-04-24 ENCOUNTER — Other Ambulatory Visit: Payer: Self-pay | Admitting: Internal Medicine

## 2012-04-25 ENCOUNTER — Other Ambulatory Visit: Payer: Self-pay

## 2012-04-25 NOTE — Telephone Encounter (Signed)
Refill for 3 months. 

## 2012-05-23 ENCOUNTER — Telehealth: Payer: Self-pay | Admitting: Internal Medicine

## 2012-05-23 NOTE — Telephone Encounter (Signed)
Dr. Sandria Manly or Dr. Lesia Sago.

## 2012-05-23 NOTE — Telephone Encounter (Signed)
Adv pt of info per [facility] of neuro names.  Pt is VERY appreciative.

## 2012-06-11 ENCOUNTER — Other Ambulatory Visit: Payer: Self-pay | Admitting: Cardiology

## 2012-07-04 ENCOUNTER — Encounter: Payer: Self-pay | Admitting: Internal Medicine

## 2012-07-15 ENCOUNTER — Other Ambulatory Visit: Payer: Self-pay | Admitting: Internal Medicine

## 2012-07-15 ENCOUNTER — Other Ambulatory Visit: Payer: BC Managed Care – PPO | Admitting: Internal Medicine

## 2012-07-15 DIAGNOSIS — Z0000 Encounter for general adult medical examination without abnormal findings: Secondary | ICD-10-CM

## 2012-07-15 DIAGNOSIS — E785 Hyperlipidemia, unspecified: Secondary | ICD-10-CM

## 2012-07-15 DIAGNOSIS — I10 Essential (primary) hypertension: Secondary | ICD-10-CM

## 2012-07-15 LAB — CBC WITH DIFFERENTIAL/PLATELET
Basophils Relative: 1 % (ref 0–1)
Eosinophils Absolute: 0.5 10*3/uL (ref 0.0–0.7)
HCT: 35.3 % — ABNORMAL LOW (ref 36.0–46.0)
Hemoglobin: 12 g/dL (ref 12.0–15.0)
MCH: 30.4 pg (ref 26.0–34.0)
MCHC: 34 g/dL (ref 30.0–36.0)
MCV: 89.4 fL (ref 78.0–100.0)
Monocytes Absolute: 0.6 10*3/uL (ref 0.1–1.0)
Monocytes Relative: 7 % (ref 3–12)

## 2012-07-15 LAB — COMPREHENSIVE METABOLIC PANEL
Albumin: 4.1 g/dL (ref 3.5–5.2)
Alkaline Phosphatase: 38 U/L — ABNORMAL LOW (ref 39–117)
BUN: 15 mg/dL (ref 6–23)
CO2: 26 mEq/L (ref 19–32)
Glucose, Bld: 107 mg/dL — ABNORMAL HIGH (ref 70–99)
Potassium: 4.1 mEq/L (ref 3.5–5.3)
Total Bilirubin: 0.9 mg/dL (ref 0.3–1.2)

## 2012-07-15 LAB — LIPID PANEL
Cholesterol: 218 mg/dL — ABNORMAL HIGH (ref 0–200)
HDL: 53 mg/dL (ref >39)
LDL Cholesterol: 127 mg/dL — ABNORMAL HIGH (ref 0–99)
Triglycerides: 188 mg/dL — ABNORMAL HIGH (ref <150)

## 2012-07-18 ENCOUNTER — Encounter: Payer: Self-pay | Admitting: Internal Medicine

## 2012-07-18 ENCOUNTER — Ambulatory Visit (INDEPENDENT_AMBULATORY_CARE_PROVIDER_SITE_OTHER): Payer: BC Managed Care – PPO | Admitting: Internal Medicine

## 2012-07-18 ENCOUNTER — Other Ambulatory Visit: Payer: Self-pay | Admitting: Internal Medicine

## 2012-07-18 VITALS — BP 116/64 | HR 72 | Temp 98.5°F | Ht 64.5 in | Wt 196.0 lb

## 2012-07-18 DIAGNOSIS — Z23 Encounter for immunization: Secondary | ICD-10-CM

## 2012-07-18 DIAGNOSIS — F411 Generalized anxiety disorder: Secondary | ICD-10-CM

## 2012-07-18 DIAGNOSIS — Z9889 Other specified postprocedural states: Secondary | ICD-10-CM

## 2012-07-18 DIAGNOSIS — E785 Hyperlipidemia, unspecified: Secondary | ICD-10-CM

## 2012-07-18 DIAGNOSIS — IMO0001 Reserved for inherently not codable concepts without codable children: Secondary | ICD-10-CM

## 2012-07-18 DIAGNOSIS — F419 Anxiety disorder, unspecified: Secondary | ICD-10-CM

## 2012-07-18 DIAGNOSIS — E669 Obesity, unspecified: Secondary | ICD-10-CM

## 2012-07-18 DIAGNOSIS — M7918 Myalgia, other site: Secondary | ICD-10-CM

## 2012-07-18 DIAGNOSIS — Z8669 Personal history of other diseases of the nervous system and sense organs: Secondary | ICD-10-CM

## 2012-07-18 DIAGNOSIS — I10 Essential (primary) hypertension: Secondary | ICD-10-CM

## 2012-07-18 DIAGNOSIS — Z09 Encounter for follow-up examination after completed treatment for conditions other than malignant neoplasm: Secondary | ICD-10-CM

## 2012-07-18 MED ORDER — TETANUS-DIPHTH-ACELL PERTUSSIS 5-2.5-18.5 LF-MCG/0.5 IM SUSP: 0.5000 mL | Freq: Once | INTRAMUSCULAR | Status: DC

## 2012-07-18 NOTE — Telephone Encounter (Signed)
Refill Ativan for 6 months

## 2012-07-19 ENCOUNTER — Other Ambulatory Visit: Payer: Self-pay

## 2012-08-15 ENCOUNTER — Encounter: Payer: Self-pay | Admitting: Internal Medicine

## 2012-09-04 DIAGNOSIS — M7918 Myalgia, other site: Secondary | ICD-10-CM | POA: Insufficient documentation

## 2012-09-04 DIAGNOSIS — F419 Anxiety disorder, unspecified: Secondary | ICD-10-CM | POA: Insufficient documentation

## 2012-09-04 DIAGNOSIS — Z8669 Personal history of other diseases of the nervous system and sense organs: Secondary | ICD-10-CM | POA: Insufficient documentation

## 2012-09-04 NOTE — Patient Instructions (Addendum)
Take Sterapred DS 10 mg 6 day dosepak for allergy symptoms. Take Vicodin sparingly for musculoskeletal pain and/or headache. Return in 6 months.

## 2012-09-04 NOTE — Progress Notes (Signed)
Subjective:    Patient ID: Olivia Reynolds, female    DOB: 07-03-1951, 61 y.o.   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 Patient now followed by Dr. Jens Som and formerly by  Dr. Deborah Chalk for history of paroxysmal atrial fibrillation. She has a history of hypertension. She had echocardiogram February 2010 showing normal LV function, mild LVH with impaired relaxation and mild aortic valve calcification. Nuclear study in February 2010 showed an ejection fraction of 75% and normal perfusion.  In addition to PAF and HTN, history of  hyperlipidemia and obesity. History of hiatal hernia with repair by Dr. Daphine Deutscher. Mild aortic valve calcification per echo 2010.  History of migraine headaches. Had colonoscopy 10/14/2006 by Dr. Victorino Dike. Transverse colon polyp removed was hyperplastic. Had endoscopy 10/19/2007 showing 9 cm vital hernia which was thought to be causing chest pain at the time. Subsequently had repair by Dr. 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 She had laparoscopic takedown of gastric incarceration 5 hernia. Repair of diaphragm with 5 sutures posteriorly with mesh sutured around defect and Nissen fundoplication.   History of knee surgery 2004, surgery for epicondylitis of the elbow in 2002, tubal ligation 1986. 3 pregnancies no miscarriages.  Does not smoke. Social alcohol consumption consisting of wine. 3 adult children a daughter and 2 sons.  Family history: Mother died of complications of COPD. Father with history of dementia. Father with history of hypertension and stroke. Sister with history of diabetes and hypertension. Another sister in good health. One brother with history of kidney stones, gout and allergies.    Review of Systems  Constitutional: Positive for fatigue.  HENT: Positive for  congestion and rhinorrhea.   Eyes: Negative.   Respiratory: Negative.   Cardiovascular: Positive for palpitations.  Gastrointestinal: Negative.   Genitourinary: Negative.   Musculoskeletal: Positive for arthralgias.  Neurological: Positive for headaches.  Hematological: Negative.   Psychiatric/Behavioral:       Anxiety       Objective:   Physical Exam  Vitals reviewed. Constitutional: She is oriented to person, place, and time. She appears well-developed and well-nourished. No distress.  HENT:  Head: Normocephalic and atraumatic.  Right Ear: External ear normal.  Left Ear: External ear normal.  Mouth/Throat: Oropharynx is clear and moist.  Eyes: Conjunctivae normal and EOM are normal. Pupils are equal, round, and reactive to light. Right eye exhibits no discharge. Left eye exhibits no discharge. No scleral icterus.  Neck: Neck supple. No JVD present. No thyromegaly present.  Cardiovascular: Normal rate, regular rhythm, normal heart sounds and intact distal pulses.   No murmur heard. Pulmonary/Chest: Effort normal and breath sounds normal. She has no wheezes. She has no rales.       Breasts normal female  Abdominal: Soft. Bowel sounds are normal. She exhibits no distension and no mass. There is no tenderness. There is no rebound and no guarding.  Musculoskeletal: Normal range of motion. She exhibits no edema.  Lymphadenopathy:    She has no cervical adenopathy.  Neurological: She is alert and oriented to person, place, and time. She has normal reflexes. No cranial nerve deficit. Coordination normal.  Skin: Skin is warm and dry. No rash noted. She is not diaphoretic.  Psychiatric: She has a normal mood and affect. Her behavior is normal. Judgment and thought content normal.  Assessment & Plan:  Hypertension  History of paroxysmal atrial fibrillation  Hyperlipidemia currently on Crestor  Musculoskeletal pain consisting of knee pain, minor arthralgias and  occasional back pain. Celebrex bothered her stomach. Is trying Mobic.  Obesity  Migraine headaches  Anxiety  History of large vital hernia status post Nissen fundoplication done laparoscopically 2009  New complaint of runny nose nasal pressure and sneezing consistent with allergic rhinitis  Fatigue  Plan: B12 will be added to lab work. Sterapred DS 10 mg 6 day dosepak which will help headaches and allergic rhinitis symptoms. Prescription for Vicodin 5/500 #60 one by mouth twice daily as needed for musculoskeletal pain and/or headaches. Continue Zoloft. Continue Crestor and losartan. Patient may take Ativan 2 mg at bedtime for sleep. Continue metoprolol. Encouraged diet and exercise. Return in 6 months.

## 2012-09-06 ENCOUNTER — Ambulatory Visit (AMBULATORY_SURGERY_CENTER): Payer: BC Managed Care – PPO

## 2012-09-06 ENCOUNTER — Encounter: Payer: Self-pay | Admitting: Internal Medicine

## 2012-09-06 VITALS — Ht 65.0 in | Wt 196.0 lb

## 2012-09-06 DIAGNOSIS — Z86010 Personal history of colonic polyps: Secondary | ICD-10-CM

## 2012-09-06 DIAGNOSIS — Z1211 Encounter for screening for malignant neoplasm of colon: Secondary | ICD-10-CM

## 2012-09-06 MED ORDER — NA SULFATE-K SULFATE-MG SULF 17.5-3.13-1.6 GM/177ML PO SOLN: 1.0000 | Freq: Once | ORAL | Status: DC

## 2012-09-16 ENCOUNTER — Encounter: Payer: Self-pay | Admitting: Internal Medicine

## 2012-09-16 ENCOUNTER — Other Ambulatory Visit: Payer: Self-pay | Admitting: Cardiology

## 2012-09-16 ENCOUNTER — Ambulatory Visit (AMBULATORY_SURGERY_CENTER): Payer: BC Managed Care – PPO | Admitting: Internal Medicine

## 2012-09-16 VITALS — BP 137/84 | HR 62 | Temp 98.8°F | Resp 18 | Ht 65.0 in | Wt 196.0 lb

## 2012-09-16 DIAGNOSIS — D126 Benign neoplasm of colon, unspecified: Secondary | ICD-10-CM

## 2012-09-16 DIAGNOSIS — Z860100 Personal history of colon polyps, unspecified: Secondary | ICD-10-CM

## 2012-09-16 DIAGNOSIS — Z1211 Encounter for screening for malignant neoplasm of colon: Secondary | ICD-10-CM

## 2012-09-16 DIAGNOSIS — K5730 Diverticulosis of large intestine without perforation or abscess without bleeding: Secondary | ICD-10-CM

## 2012-09-16 DIAGNOSIS — K635 Polyp of colon: Secondary | ICD-10-CM

## 2012-09-16 MED ORDER — SODIUM CHLORIDE 0.9 % IV SOLN: 500.0000 mL | INTRAVENOUS | Status: DC

## 2012-09-16 NOTE — Op Note (Signed)
[facility] [address] [place], [address]   COLONOSCOPY PROCEDURE REPORT  PATIENT: Olivia, Reynolds  MR#: [id number] BIRTHDATE: 06-26-1951 , 61  yrs. old GENDER: Female ENDOSCOPIST: Iva Boop, MD, [facility] PROCEDURE DATE:  09/16/2012 PROCEDURE:   Colonoscopy with snare polypectomy ASA CLASS:   Class II INDICATIONS:Screening and surveillance,personal history of colonic polyps and Patient's personal history of colon polyps. MEDICATIONS: propofol (Diprivan) 300mg  IV, MAC sedation, administered by CRNA, and These medications were titrated to patient response per physician's verbal order  DESCRIPTION OF PROCEDURE:   After the risks benefits and alternatives of the procedure were thoroughly explained, informed consent was obtained.  A digital rectal exam revealed no abnormalities of the rectum.   The LB [id number]-[id number]  endoscope was introduced through the anus and advanced to the cecum, which was identified by both the appendix and ileocecal valve. No adverse events experienced.   The quality of the prep was Suprep excellent The instrument was then slowly withdrawn as the colon was fully examined.      COLON FINDINGS: Two diminutive smooth sessile polyps were found in the ascending colon and distal sigmoid colon.  A polypectomy was performed with a cold snare.  The resection was complete and the polyp tissue was completely retrieved.   There was severe diverticulosis noted in the sigmoid colon with associated luminal narrowing and muscular hypertrophy.   The colon mucosa was otherwise normal.  Retroflexed views revealed no abnormalities. The time to cecum=4 minutes 59 seconds.  Withdrawal time=12 minutes 44 seconds.  The scope was withdrawn and the procedure completed. COMPLICATIONS: There were no complications.  ENDOSCOPIC IMPRESSION: 1.   Two diminutive sessile polyps were found in the ascending colon and distal sigmoid colon; polypectomy was  performed with a cold snare 2.   There was severe diverticulosis noted in the sigmoid colon 3.   The colon mucosa was otherwise normal - excellent prep, hx polyps in past  RECOMMENDATIONS: Timing of repeat colonoscopy will be determined by pathology findings.   eSigned:  Iva Boop, MD, [facility] 09/16/2012 4:13 PM cc: The Patient and Sharlet Salina, MD

## 2012-09-16 NOTE — Progress Notes (Signed)
1607 a/ox3 pleased report to [employer]

## 2012-09-16 NOTE — Patient Instructions (Addendum)
Two small and benign-appearing polyps were removed. You have diverticulosis also - not usually a problem. I will send a letter about the results and plans.   Thank you for choosing me and [facility].  Iva Boop, MD, FACG YOU HAD AN ENDOSCOPIC PROCEDURE TODAY AT THE [facility]: Refer to the procedure report that was given to you for any specific questions about what was found during the examination.  If the procedure report does not answer your questions, please call your gastroenterologist to clarify.  If you requested that your care partner not be given the details of your procedure findings, then the procedure report has been included in a sealed envelope for you to review at your convenience later.  YOU SHOULD EXPECT: Some feelings of bloating in the abdomen. Passage of more gas than usual.  Walking can help get rid of the air that was put into your GI tract during the procedure and reduce the bloating. If you had a lower endoscopy (such as a colonoscopy or flexible sigmoidoscopy) you may notice spotting of blood in your stool or on the toilet paper. If you underwent a bowel prep for your procedure, then you may not have a normal bowel movement for a few days.  DIET: Your first meal following the procedure should be a light meal and then it is ok to progress to your normal diet.  A half-sandwich or bowl of soup is an example of a good first meal.  Heavy or fried foods are harder to digest and may make you feel nauseous or bloated.  Likewise meals heavy in dairy and vegetables can cause extra gas to form and this can also increase the bloating.  Drink plenty of fluids but you should avoid alcoholic beverages for 24 hours.  ACTIVITY: Your care partner should take you home directly after the procedure.  You should plan to take it easy, moving slowly for the rest of the day.  You can resume normal activity the day after the procedure however you should NOT DRIVE or use  heavy machinery for 24 hours (because of the sedation medicines used during the test).    SYMPTOMS TO REPORT IMMEDIATELY: A gastroenterologist can be reached at any hour.  During normal business hours, 8:30 AM to 5:00 PM Monday through Friday, call [phone].  After hours and on weekends, please call the GI answering service at [phone] who will take a message and have the physician on call contact you.   Following lower endoscopy (colonoscopy or flexible sigmoidoscopy):  Excessive amounts of blood in the stool  Significant tenderness or worsening of abdominal pains  Swelling of the abdomen that is new, acute  Fever of 100F or higher  Following upper endoscopy (EGD)  Vomiting of blood or coffee ground material  New chest pain or pain under the shoulder blades  Painful or persistently difficult swallowing  New shortness of breath  Fever of 100F or higher  Black, tarry-looking stools  FOLLOW UP: If any biopsies were taken you will be contacted by phone or by letter within the next 1-3 weeks.  Call your gastroenterologist if you have not heard about the biopsies in 3 weeks.  Our staff will call the home number listed on your records the next business day following your procedure to check on you and address any questions or concerns that you may have at that time regarding the information given to you following your procedure. This is a courtesy call and so  if there is no answer at the home number and we have not heard from you through the emergency physician on call, we will assume that you have returned to your regular daily activities without incident.  SIGNATURES/CONFIDENTIALITY: You and/or your care partner have signed paperwork which will be entered into your electronic medical record.  These signatures attest to the fact that that the information above on your After Visit Summary has been reviewed and is understood.  Full responsibility of the confidentiality of this  discharge information lies with you and/or your care-partner.

## 2012-09-16 NOTE — Progress Notes (Signed)
Patient did not experience any of the following events: a burn prior to discharge; a fall within the facility; wrong site/side/patient/procedure/implant event; or a hospital transfer or hospital admission upon discharge from the facility. (G8907) Patient did not have preoperative order for IV antibiotic SSI prophylaxis. (G8918)  

## 2012-09-19 ENCOUNTER — Telehealth: Payer: Self-pay | Admitting: *Deleted

## 2012-09-19 NOTE — Telephone Encounter (Signed)
  Follow up Call-  Call back number 09/16/2012  Post procedure Call Back phone  # [phone]  Permission to leave phone message Yes     Patient questions:  Do you have a fever, pain , or abdominal swelling? no Pain Score  0 *  Have you tolerated food without any problems? yes  Have you been able to return to your normal activities? yes  Do you have any questions about your discharge instructions: Diet   no Medications  no Follow up visit  no  Do you have questions or concerns about your Care? no  Actions: * If pain score is 4 or above: No action needed, pain <4.

## 2012-09-26 ENCOUNTER — Encounter: Payer: Self-pay | Admitting: Internal Medicine

## 2012-09-26 DIAGNOSIS — Z860100 Personal history of colon polyps, unspecified: Secondary | ICD-10-CM | POA: Insufficient documentation

## 2012-09-26 NOTE — Progress Notes (Signed)
Quick Note:  2 hyperplastic - diminutive ascending and sigmoid Consider repeat colonoscopy 5-10 years - chart review in 08/2017 planned ______

## 2012-12-16 ENCOUNTER — Other Ambulatory Visit: Payer: Self-pay | Admitting: Internal Medicine

## 2012-12-21 ENCOUNTER — Encounter: Payer: Self-pay | Admitting: Cardiology

## 2013-01-05 ENCOUNTER — Other Ambulatory Visit: Payer: Self-pay | Admitting: Cardiology

## 2013-02-10 ENCOUNTER — Other Ambulatory Visit: Payer: Self-pay | Admitting: Cardiology

## 2013-02-22 ENCOUNTER — Other Ambulatory Visit: Payer: Self-pay | Admitting: *Deleted

## 2013-02-22 MED ORDER — ROSUVASTATIN CALCIUM 10 MG PO TABS: ORAL_TABLET | ORAL | Status: DC

## 2013-03-17 ENCOUNTER — Telehealth: Payer: Self-pay

## 2013-03-17 MED ORDER — BUTALBITAL-APAP-CAFFEINE 50-325-40 MG PO TABS: 1.0000 | ORAL_TABLET | Freq: Four times a day (QID) | ORAL | Status: AC | PRN

## 2013-03-17 NOTE — Telephone Encounter (Signed)
Patient calls with a migraine headache; some nausea but no vomiting. Requests refill of Fioricet. Does not want anything for the nausea. OK per verbal order of Dr. Lenord Fellers to refill Fioricet. Patient informed.

## 2013-03-27 ENCOUNTER — Encounter: Payer: BC Managed Care – PPO | Admitting: Cardiology

## 2013-03-27 NOTE — Progress Notes (Signed)
HPI: Pleasant female for fu of PAF. Echocardiogram in February of 2010 showed normal LV function, mild left ventricular hypertrophy with impaired relaxation and mild aortic valve calcification. Nuclear study in February of 2010 showed an ejection fraction of 75% and normal perfusion. Patient last seen in June of 2013. At that time we discussed discontinuing aspirin and beginning pradaxa but she continued with aspirin. Since I last saw her,    Current Outpatient Prescriptions  Medication Sig Dispense Refill  . aspirin 81 MG tablet Take 81 mg by mouth daily.        . butalbital-acetaminophen-caffeine (FIORICET) 50-325-40 MG per tablet Take 1-2 tablets by mouth every 6 (six) hours as needed for headache.  30 tablet  1  . celecoxib (CELEBREX) 200 MG capsule Take 200 mg by mouth as needed.      . CRESTOR 10 MG tablet TAKE 1 TABLET BY MOUTH DAILY  30 tablet  0  . LORazepam (ATIVAN) 2 MG tablet TAKE 1 TABLET BY MOUTH EVERY NIGHT AT BEDTIME AS NEEDED  30 tablet  0  . losartan (COZAAR) 50 MG tablet TAKE 1 TABLET BY MOUTH DAILY  90 tablet  3  . meloxicam (MOBIC) 15 MG tablet Take 15 mg by mouth daily.      . metoprolol succinate (TOPROL-XL) 50 MG 24 hr tablet TAKE 1 TABLET BY MOUTH TWICE DAILY  180 tablet  0  . predniSONE (STERAPRED UNI-PAK) 10 MG tablet       . rosuvastatin (CRESTOR) 10 MG tablet TAKE ONE TABLET BY MOUTH DAILY  30 tablet  8  . sertraline (ZOLOFT) 100 MG tablet TAKE 1 TABLET BY MOUTH DAILY  90 tablet  PRN  . sertraline (ZOLOFT) 50 MG tablet Take 1 tablet (50 mg total) by mouth daily.  30 tablet  PRN   No current facility-administered medications for this visit.     Past Medical History  Diagnosis Date  . PAF (paroxysmal atrial fibrillation)   . Hyperlipidemia   . HTN (hypertension)   . Aortic valve calcification     Mild per echo in February of 2010  . Obesity   . History of hiatal hernia     with repair  . Diverticulosis   . Chronic headaches   . Depression   . GERD  (gastroesophageal reflux disease)     Past Surgical History  Procedure Laterality Date  . Hernia repair  2008    nissan fundiplication  . Eye surgery      L-yey myopia  . Knee arthroscopy  2005    R-knee  . Elbow surgery  left    Tendon Surgery  . Tubal ligation    . Tonsillectomy    . Colonoscopy    . Upper gastrointestinal endoscopy      History   Social History  . Marital Status: Legally Separated    Spouse Name: N/A    Number of Children: N/A  . Years of Education: N/A   Occupational History  . Not on file.   Social History Main Topics  . Smoking status: Former Smoker    Types: Cigarettes    Quit date: 09/06/1977  . Smokeless tobacco: Never Used  . Alcohol Use: No     Comment: once a month  . Drug Use: No  . Sexually Active: Not on file   Other Topics Concern  . Not on file   Social History Narrative  . No narrative on file    ROS: no fevers or chills,  productive cough, hemoptysis, dysphasia, odynophagia, melena, hematochezia, dysuria, hematuria, rash, seizure activity, orthopnea, PND, pedal edema, claudication. Remaining systems are negative.  Physical Exam: Well-developed well-nourished in no acute distress.  Skin is warm and dry.  HEENT is normal.  Neck is supple.  Chest is clear to auscultation with normal expansion.  Cardiovascular exam is regular rate and rhythm.  Abdominal exam nontender or distended. No masses palpated. Extremities show no edema. neuro grossly intact  ECG     This encounter was created in error - please disregard.

## 2013-04-17 ENCOUNTER — Other Ambulatory Visit: Payer: Self-pay | Admitting: Cardiology

## 2013-05-31 ENCOUNTER — Encounter: Payer: Self-pay | Admitting: Cardiology

## 2013-05-31 ENCOUNTER — Ambulatory Visit (INDEPENDENT_AMBULATORY_CARE_PROVIDER_SITE_OTHER): Payer: BC Managed Care – PPO | Admitting: Cardiology

## 2013-05-31 VITALS — BP 135/78 | HR 76 | Ht 65.0 in | Wt 189.0 lb

## 2013-05-31 DIAGNOSIS — E785 Hyperlipidemia, unspecified: Secondary | ICD-10-CM

## 2013-05-31 DIAGNOSIS — I10 Essential (primary) hypertension: Secondary | ICD-10-CM

## 2013-05-31 DIAGNOSIS — I4891 Unspecified atrial fibrillation: Secondary | ICD-10-CM

## 2013-05-31 DIAGNOSIS — R0609 Other forms of dyspnea: Secondary | ICD-10-CM

## 2013-05-31 DIAGNOSIS — R0600 Dyspnea, unspecified: Secondary | ICD-10-CM

## 2013-05-31 DIAGNOSIS — I480 Paroxysmal atrial fibrillation: Secondary | ICD-10-CM

## 2013-05-31 NOTE — Assessment & Plan Note (Signed)
Some increase in dyspnea on exertion. Repeat echocardiogram.

## 2013-05-31 NOTE — Patient Instructions (Addendum)

## 2013-05-31 NOTE — Assessment & Plan Note (Signed)
She is complaining of myalgias. She wonders whether this may be related to her Crestor. I have recommended discontinuing for 6 weeks to see if symptoms improve. If not she will resume.

## 2013-05-31 NOTE — Assessment & Plan Note (Signed)
She remains in sinus rhythm. Continue Toprol. Embolic risk factor of hypertension. We previously discussed anticoagulation issues including aspirin versus Coumadin versus new oral anticoagulant agent. She would prefer aspirin. She understands higher risk of CVA.

## 2013-05-31 NOTE — Progress Notes (Signed)
HPI: Pleasant female for fu of PAF. Echocardiogram in February of 2010 showed normal LV function, mild left ventricular hypertrophy with impaired relaxation and mild aortic valve calcification. Nuclear study in February of 2010 showed an ejection fraction of 75% and normal perfusion. Previously declined NOAC. Patient last seen in June of 2013. Since then, she has noticed some dyspnea on exertion which is new. No orthopnea, PND, pedal edema chest pain or syncope. Occasional brief palpitations but no atrial fibrillation by her report.   Current Outpatient Prescriptions  Medication Sig Dispense Refill  . aspirin 81 MG tablet Take 81 mg by mouth daily.        . butalbital-acetaminophen-caffeine (FIORICET) 50-325-40 MG per tablet Take 1-2 tablets by mouth every 6 (six) hours as needed for headache.  30 tablet  1  . LORazepam (ATIVAN) 2 MG tablet TAKE 1 TABLET BY MOUTH EVERY NIGHT AT BEDTIME AS NEEDED  30 tablet  0  . losartan (COZAAR) 50 MG tablet TAKE 1 TABLET BY MOUTH DAILY  90 tablet  3  . meloxicam (MOBIC) 15 MG tablet Take 15 mg by mouth daily.      . metoprolol succinate (TOPROL-XL) 50 MG 24 hr tablet TAKE 1 TABLET BY MOUTH TWICE DAILY  180 tablet  0  . predniSONE (STERAPRED UNI-PAK) 10 MG tablet       . rosuvastatin (CRESTOR) 10 MG tablet TAKE ONE TABLET BY MOUTH DAILY  30 tablet  8  . sertraline (ZOLOFT) 100 MG tablet TAKE 1 TABLET BY MOUTH DAILY  90 tablet  PRN  . sertraline (ZOLOFT) 50 MG tablet Take 1 tablet (50 mg total) by mouth daily.  30 tablet  PRN   No current facility-administered medications for this visit.     Past Medical History  Diagnosis Date  . PAF (paroxysmal atrial fibrillation)   . Hyperlipidemia   . HTN (hypertension)   . Aortic valve calcification     Mild per echo in February of 2010  . Obesity   . History of hiatal hernia     with repair  . Diverticulosis   . Chronic headaches   . Depression   . GERD (gastroesophageal reflux disease)     Past  Surgical History  Procedure Laterality Date  . Hernia repair  2008    nissan fundiplication  . Eye surgery      L-yey myopia  . Knee arthroscopy  2005    R-knee  . Elbow surgery  left    Tendon Surgery  . Tubal ligation    . Tonsillectomy    . Colonoscopy    . Upper gastrointestinal endoscopy      History   Social History  . Marital Status: Legally Separated    Spouse Name: N/A    Number of Children: N/A  . Years of Education: N/A   Occupational History  . Not on file.   Social History Main Topics  . Smoking status: Former Smoker    Types: Cigarettes    Quit date: 09/06/1977  . Smokeless tobacco: Never Used  . Alcohol Use: No     Comment: once a month  . Drug Use: No  . Sexual Activity: Not on file   Other Topics Concern  . Not on file   Social History Narrative  . No narrative on file    ROS: no fevers or chills, productive cough, hemoptysis, dysphasia, odynophagia, melena, hematochezia, dysuria, hematuria, rash, seizure activity, orthopnea, PND, pedal edema, claudication. Remaining systems are negative.  Physical Exam: Well-developed well-nourished in no acute distress.  Skin is warm and dry.  HEENT is normal.  Neck is supple.  Chest is clear to auscultation with normal expansion.  Cardiovascular exam is regular rate and rhythm.  Abdominal exam nontender or distended. No masses palpated. Extremities show no edema. neuro grossly intact  ECG Sinus rhythm with no ST changes

## 2013-05-31 NOTE — Assessment & Plan Note (Signed)
Blood pressure controlled.continue present medications. 

## 2013-06-20 ENCOUNTER — Other Ambulatory Visit: Payer: Self-pay | Admitting: Cardiology

## 2013-06-26 ENCOUNTER — Other Ambulatory Visit (HOSPITAL_COMMUNITY): Payer: BC Managed Care – PPO

## 2013-07-26 ENCOUNTER — Other Ambulatory Visit: Payer: Self-pay | Admitting: Cardiology

## 2013-08-15 ENCOUNTER — Encounter: Payer: Self-pay | Admitting: Gynecology

## 2013-08-15 ENCOUNTER — Encounter: Payer: Self-pay | Admitting: Anesthesiology

## 2013-08-15 ENCOUNTER — Ambulatory Visit (INDEPENDENT_AMBULATORY_CARE_PROVIDER_SITE_OTHER): Payer: BC Managed Care – PPO | Admitting: Gynecology

## 2013-08-15 ENCOUNTER — Other Ambulatory Visit (HOSPITAL_COMMUNITY)
Admission: RE | Admit: 2013-08-15 | Discharge: 2013-08-15 | Disposition: A | Discharge status: Home / Self Care | Payer: BC Managed Care – PPO | Source: Ambulatory Visit | Attending: Gynecology | Admitting: Gynecology

## 2013-08-15 VITALS — BP 142/92 | Ht 65.25 in | Wt 193.0 lb

## 2013-08-15 DIAGNOSIS — Z1151 Encounter for screening for human papillomavirus (HPV): Secondary | ICD-10-CM | POA: Insufficient documentation

## 2013-08-15 DIAGNOSIS — Z01419 Encounter for gynecological examination (general) (routine) without abnormal findings: Secondary | ICD-10-CM

## 2013-08-15 DIAGNOSIS — Z23 Encounter for immunization: Secondary | ICD-10-CM

## 2013-08-15 DIAGNOSIS — M8580 Other specified disorders of bone density and structure, unspecified site: Secondary | ICD-10-CM

## 2013-08-15 DIAGNOSIS — S39012A Strain of muscle, fascia and tendon of lower back, initial encounter: Secondary | ICD-10-CM

## 2013-08-15 DIAGNOSIS — M899 Disorder of bone, unspecified: Secondary | ICD-10-CM

## 2013-08-15 MED ORDER — CYCLOBENZAPRINE HCL 5 MG PO TABS: ORAL_TABLET | ORAL | Status: DC

## 2013-08-15 MED ORDER — PREDNISONE (PAK) 10 MG PO TABS: ORAL_TABLET | Freq: Every day | ORAL | Status: DC

## 2013-08-15 MED ORDER — IBUPROFEN 800 MG PO TABS: ORAL_TABLET | ORAL | Status: DC

## 2013-08-15 NOTE — Patient Instructions (Addendum)
Lumbosacral Strain Lumbosacral strain is one of the most common causes of back pain. There are many causes of back pain. Most are not serious conditions. CAUSES  Your backbone (spinal column) is made up of 24 main vertebral bodies, the sacrum, and the coccyx. These are held together by muscles and tough, fibrous tissue (ligaments). Nerve roots pass through the openings between the vertebrae. A sudden move or injury to the back may cause injury to, or pressure on, these nerves. This may result in localized back pain or pain movement (radiation) into the buttocks, down the leg, and into the foot. Sharp, shooting pain from the buttock down the back of the leg (sciatica) is frequently associated with a ruptured (herniated) disk. Pain may be caused by muscle spasm alone. Your caregiver can often find the cause of your pain by the details of your symptoms and an exam. In some cases, you may need tests (such as X-rays). Your caregiver will work with you to decide if any tests are needed based on your specific exam. HOME CARE INSTRUCTIONS   Avoid an underactive lifestyle. Active exercise, as directed by your caregiver, is your greatest weapon against back pain.  Avoid hard physical activities (tennis, racquetball, waterskiing) if you are not in proper physical condition for it. This may aggravate or create problems.  If you have a back problem, avoid sports requiring sudden body movements. Swimming and walking are generally safer activities.  Maintain good posture.  Avoid becoming overweight (obese).  Use bed rest for only the most extreme, sudden (acute) episode. Your caregiver will help you determine how much bed rest is necessary.  For acute conditions, you may put ice on the injured area.  Put ice in a plastic bag.  Place a towel between your skin and the bag.  Leave the ice on for 15-20 minutes at a time, every 2 hours, or as needed.  After you are improved and more active, it may help to  apply heat for 30 minutes before activities. See your caregiver if you are having pain that lasts longer than expected. Your caregiver can advise appropriate exercises or therapy if needed. With conditioning, most back problems can be avoided. SEEK IMMEDIATE MEDICAL CARE IF:   You have numbness, tingling, weakness, or problems with the use of your arms or legs.  You experience severe back pain not relieved with medicines.  There is a change in bowel or bladder control.  You have increasing pain in any area of the body, including your belly (abdomen).  You notice shortness of breath, dizziness, or feel faint.  You feel sick to your stomach (nauseous), are throwing up (vomiting), or become sweaty.  You notice discoloration of your toes or legs, or your feet get very cold.  Your back pain is getting worse.  You have a fever. MAKE SURE YOU: Influenza Vaccine (Flu Vaccine, Inactivated) 2013 2014 What You Need to Know WHY GET VACCINATED?  Influenza ("flu") is a contagious disease that spreads around the [place] every winter, usually between October and May.  Flu is caused by the influenza virus, and can be spread by coughing, sneezing, and close contact.  Anyone can get flu, but the risk of getting flu is highest among children. Symptoms come on suddenly and may last several days. They can include:  Fever or chills.  Sore throat.  Muscle aches.  Fatigue.  Cough.  Headache.  Runny or stuffy nose. Flu can make some people much sicker than others. These people  include young children, people 42 and older, pregnant women, and people with certain health conditions such as heart, lung or kidney disease, or a weakened immune system. Flu vaccine is especially important for these people, and anyone in close contact with them. Flu can also lead to pneumonia, and make existing medical conditions worse. It can cause diarrhea and seizures in children. Each year thousands of people in  the [place] die from flu, and many more are hospitalized. Flu vaccine is the best protection we have from flu and its complications. Flu vaccine also helps prevent spreading flu from person to person. INACTIVATED FLU VACCINE There are 2 types of influenza vaccine:  You are getting an inactivated flu vaccine, which does not contain any live influenza virus. It is given by injection with a needle, and often called the "flu shot."  A different live, attenuated (weakened) influenza vaccine is sprayed into the nostrils. This vaccine is described in a separate Vaccine Information Statement. Flu vaccine is recommended every year. Children 6 months through 56 years of age should get 2 doses the first year they get vaccinated. Flu viruses are always changing. Each year's flu vaccine is made to protect from viruses that are most likely to cause disease that year. While flu vaccine cannot prevent all cases of flu, it is our best defense against the disease. Inactivated flu vaccine protects against 3 or 4 different influenza viruses. It takes about 2 weeks for protection to develop after the vaccination, and protection lasts several months to a year. Some illnesses that are not caused by influenza virus are often mistaken for flu. Flu vaccine will not prevent these illnesses. It can only prevent influenza. A "high-dose" flu vaccine is available for people 22 years of age and older. The person giving you the vaccine can tell you more about it. Some inactivated flu vaccine contains a very small amount of a mercury-based preservative called thimerosal. Studies have shown that thimerosal in vaccines is not harmful, but flu vaccines that do not contain a preservative are available. SOME PEOPLE SHOULD NOT GET THIS VACCINE Tell the person who gives you the vaccine:  If you have any severe (life-threatening) allergies. If you ever had a life-threatening allergic reaction after a dose of flu vaccine, or have a  severe allergy to any part of this vaccine, you may be advised not to get a dose. Most, but not all, types of flu vaccine contain a small amount of egg.  If you ever had Guillain Barr Syndrome (a severe paralyzing illness, also called GBS). Some people with a history of GBS should not get this vaccine. This should be discussed with your doctor.  If you are not feeling well. They might suggest waiting until you feel better. But you should come back. RISKS OF A VACCINE REACTION With a vaccine, like any medicine, there is a chance of side effects. These are usually mild and go away on their own. Serious side effects are also possible, but are very rare. Inactivated flu vaccine does not contain live flu virus, sogetting flu from this vaccine is not possible. Brief fainting spells and related symptoms (such as jerking movements) can happen after any medical procedure, including vaccination. Sitting or lying down for about 15 minutes after a vaccination can help prevent fainting and injuries caused by falls. Tell your doctor if you feel dizzy or lightheaded, or have vision changes or ringing in the ears. Mild problems following inactivated flu vaccine:  Soreness, redness, or swelling where  the shot was given.  Hoarseness; sore, red or itchy eyes; or cough.  Fever.  Aches.  Headache.  Itching.  Fatigue. If these problems occur, they usually begin soon after the shot and last 1 or 2 days. Moderate problems following inactivated flu vaccine:  Young children who get inactivated flu vaccine and pneumococcal vaccine (PCV13) at the same time may be at increased risk for seizures caused by fever. Ask your doctor for more information. Tell your doctor if a child who is getting flu vaccine has ever had a seizure. Severe problems following inactivated flu vaccine:  A severe allergic reaction could occur after any vaccine (estimated less than 1 in a million doses).  There is a small possibility that  inactivated flu vaccine could be associated with Guillan Barr Syndrome (GBS), no more than 1 or 2 cases per million people vaccinated. This is much lower than the risk of severe complications from flu, which can be prevented by flu vaccine. The safety of vaccines is always being monitored. For more information, visit: http://floyd.org/ WHAT IF THERE IS A SERIOUS REACTION? What should I look for?  Look for anything that concerns you, such as signs of a severe allergic reaction, very high fever, or behavior changes. Signs of a severe allergic reaction can include hives, swelling of the face and throat, difficulty breathing, a fast heartbeat, dizziness, and weakness. These would start a few minutes to a few hours after the vaccination. What should I do?  If you think it is a severe allergic reaction or other emergency that cannot wait, call [phone] 1 1  or get the person to the nearest hospital. Otherwise, call your doctor.  Afterward, the reaction should be reported to the Vaccine Adverse Event Reporting System (VAERS). Your doctor might file this report, or you can do it yourself through the VAERS website at www.vaers.LAgents.no, or by calling 1-(843) 617-1187. VAERS is only for reporting reactions. They do not give medical advice. THE NATIONAL VACCINE INJURY COMPENSATION PROGRAM The National Vaccine Injury Compensation Program (VICP) is a federal program that was created to compensate people who may have been injured by certain vaccines. Persons who believe they may have been injured by a vaccine can learn about the program and about filing a claim by calling 1-(409)539-0873 or visiting the VICP website at SpiritualWord.at HOW CAN I LEARN MORE?  Ask your doctor.  Call your local or state health department.  Contact the Centers for Disease Control and Prevention (CDC):  Call 825-302-6797 (1-800-CDC-INFO) or  Visit CDC's website at BiotechRoom.com.cy CDC Inactivated Influenza  Vaccine Interim VIS (04/22/12) Document Released: 07/09/2006 Document Revised: 06/08/2012 Document Reviewed: 05/17/2012 [facility] Patient Information 2014 [place], [place].   Understand these instructions.  Will watch your condition.  Will get help right away if you are not doing well or get worse. Document Released: 06/24/2005 Document Revised: 12/07/2011 Document Reviewed: 12/14/2008 [facility] Patient Information 2014 [place], [place].

## 2013-08-15 NOTE — Progress Notes (Signed)
Olivia Reynolds 07-11-1951 [id number]   History:    62 y.o.  for annual gyn exam who is also complaining for several days of low back pain. She had been lifting her grandchild. Patient states that at times it felt some numbness shooting down her back. She denies any GU or GI complaints. She denies any fever. Review of her records indicated that she has not been seen in the office since 2011.  Bone density 2010 lowest T score was -1.7 at the [facility] her left femoral neck had a T score of -1.5 but with normal Frax Analysis Mammogram 2010 patient several years ago had left breast biopsy for a benign epidermal inclusion cyst Colonoscopy 2013 2 benign polyps removed  Patient denies any prior history of abnormal Pap smears. Patient has not received the shingles vaccine yet. Patient requesting flu vaccine today.Patient denies any vasomotor symptoms. Patient on no hormone replacement therapy.  Past medical history,surgical history, family history and social history were all reviewed and documented in the EPIC chart.  Gynecologic History No LMP recorded. Patient is postmenopausal. Contraception: post menopausal status Last Pap: 2011. Results were: normal Last mammogram: 2010. Results were: normal  Obstetric History OB History  Gravida Para Term Preterm AB SAB TAB Ectopic Multiple Living  3 3        3     # Outcome Date GA Lbr Len/2nd Weight Sex Delivery Anes PTL Lv  3 PAR           2 PAR           1 PAR                ROS: A ROS was performed and pertinent positives and negatives are included in the history.  GENERAL: No fevers or chills. HEENT: No change in vision, no earache, sore throat or sinus congestion. NECK: No pain or stiffness. CARDIOVASCULAR: No chest pain or pressure. No palpitations. PULMONARY: No shortness of breath, cough or wheeze. GASTROINTESTINAL: No abdominal pain, nausea, vomiting or diarrhea, melena or bright red blood per rectum. GENITOURINARY: No urinary frequency,  urgency, hesitancy or dysuria. MUSCULOSKELETAL:back pain DERMATOLOGIC: No rash, no itching, no lesions. ENDOCRINE: No polyuria, polydipsia, no heat or cold intolerance. No recent change in weight. HEMATOLOGICAL: No anemia or easy bruising or bleeding. NEUROLOGIC: No headache, seizures, numbness, tingling or weakness. PSYCHIATRIC: No depression, no loss of interest in normal activity or change in sleep pattern.     Exam: chaperone present  BP 142/92  Ht 5' 5.25" (1.657 m)  Wt 193 lb (87.544 kg)  BMI 31.88 kg/m2  Body mass index is 31.88 kg/(m^2).  General appearance : Well developed well nourished female. No acute distress HEENT: Neck supple, trachea midline, no carotid bruits, no thyroidmegaly Lungs: Clear to auscultation, no rhonchi or wheezes, or rib retractions  Heart: Regular rate and rhythm, no murmurs or gallops Breast:Examined in sitting and supine position were symmetrical in appearance, no palpable masses or tenderness,  no skin retraction, no nipple inversion, no nipple discharge, no skin discoloration, no axillary or supraclavicular lymphadenopathy Abdomen: no palpable masses or tenderness, no rebound or guarding Extremities: no edema or skin discoloration or tenderness Tender lumbosacral region left paraspinous muscle on palpation as well as on flexion and extension of lower extremity. Pelvic:  Bartholin, Urethra, Skene Glands: Within normal limits             Vagina: No gross lesions or discharge  Cervix: No gross lesions or discharge  Uterus  anteverted, normal size, shape and consistency, non-tender and mobile  Adnexa  Without masses or tenderness  Anus and perineum  normal   Rectovaginal  normal sphincter tone without palpated masses or tenderness             Hemoccult PCP provides     Assessment/Plan:  62 y.o. female for annual exam back muscle spasm/strain will be prescribed prednisone (Sterapred UniPak ) 10 mg tablet to take over 6 days #21. She'll also be  prescribed Flexeril as a muscle relaxant to take 5 mg at bedtime. She will also be prescribed Motrin 800 mg 3 times a day with food for 7 days. Pap smear was done today. Requisition to schedule her mammogram as well as her bone density was provided. She was also given a prescription to have her shingles vaccine. Patient did receive her flu vaccine today. Patient was reminded to do her monthly breast exam. We discussed importance of calcium and vitamin D and regular exercise for osteoporosis prevention. Her PCP has drawn her blood work.  Note: This dictation was prepared with  Dragon/digital dictation along withSmart phrase technology. Any transcriptional errors that result from this process are unintentional.   Ok Edwards MD, 12:13 PM 08/15/2013

## 2013-09-05 ENCOUNTER — Other Ambulatory Visit: Payer: BC Managed Care – PPO | Admitting: Internal Medicine

## 2013-09-05 ENCOUNTER — Other Ambulatory Visit: Payer: Self-pay | Admitting: Internal Medicine

## 2013-09-05 DIAGNOSIS — M8580 Other specified disorders of bone density and structure, unspecified site: Secondary | ICD-10-CM

## 2013-09-05 DIAGNOSIS — E785 Hyperlipidemia, unspecified: Secondary | ICD-10-CM

## 2013-09-05 DIAGNOSIS — E669 Obesity, unspecified: Secondary | ICD-10-CM

## 2013-09-05 DIAGNOSIS — I10 Essential (primary) hypertension: Secondary | ICD-10-CM

## 2013-09-05 DIAGNOSIS — Z1329 Encounter for screening for other suspected endocrine disorder: Secondary | ICD-10-CM

## 2013-09-05 LAB — CBC WITH DIFFERENTIAL/PLATELET
Eosinophils Absolute: 0.5 10*3/uL (ref 0.0–0.7)
Eosinophils Relative: 7 % — ABNORMAL HIGH (ref 0–5)
HCT: 36.3 % (ref 36.0–46.0)
Hemoglobin: 12.7 g/dL (ref 12.0–15.0)
Lymphs Abs: 3.1 10*3/uL (ref 0.7–4.0)
MCH: 30 pg (ref 26.0–34.0)
MCV: 85.6 fL (ref 78.0–100.0)
Monocytes Absolute: 0.5 10*3/uL (ref 0.1–1.0)
Monocytes Relative: 6 % (ref 3–12)
RBC: 4.24 MIL/uL (ref 3.87–5.11)

## 2013-09-05 LAB — COMPREHENSIVE METABOLIC PANEL
AST: 21 U/L (ref 0–37)
Albumin: 4.1 g/dL (ref 3.5–5.2)
BUN: 18 mg/dL (ref 6–23)
CO2: 25 mEq/L (ref 19–32)
Calcium: 9.4 mg/dL (ref 8.4–10.5)
Chloride: 107 mEq/L (ref 96–112)
Glucose, Bld: 116 mg/dL — ABNORMAL HIGH (ref 70–99)
Potassium: 3.9 mEq/L (ref 3.5–5.3)
Total Protein: 6.9 g/dL (ref 6.0–8.3)

## 2013-09-05 LAB — LIPID PANEL
Cholesterol: 218 mg/dL — ABNORMAL HIGH (ref 0–200)
VLDL: 39 mg/dL (ref 0–40)

## 2013-09-07 ENCOUNTER — Encounter: Payer: Self-pay | Admitting: Internal Medicine

## 2013-09-07 ENCOUNTER — Ambulatory Visit (INDEPENDENT_AMBULATORY_CARE_PROVIDER_SITE_OTHER): Payer: BC Managed Care – PPO | Admitting: Internal Medicine

## 2013-09-07 VITALS — BP 148/82 | HR 79 | Temp 98.4°F | Ht 64.5 in | Wt 193.0 lb

## 2013-09-07 DIAGNOSIS — E785 Hyperlipidemia, unspecified: Secondary | ICD-10-CM

## 2013-09-07 DIAGNOSIS — I10 Essential (primary) hypertension: Secondary | ICD-10-CM

## 2013-09-07 DIAGNOSIS — F32A Depression, unspecified: Secondary | ICD-10-CM

## 2013-09-07 DIAGNOSIS — Z8669 Personal history of other diseases of the nervous system and sense organs: Secondary | ICD-10-CM

## 2013-09-07 DIAGNOSIS — Z0000 Encounter for general adult medical examination without abnormal findings: Secondary | ICD-10-CM

## 2013-09-07 DIAGNOSIS — F329 Major depressive disorder, single episode, unspecified: Secondary | ICD-10-CM

## 2013-09-07 DIAGNOSIS — F341 Dysthymic disorder: Secondary | ICD-10-CM

## 2013-09-07 DIAGNOSIS — I480 Paroxysmal atrial fibrillation: Secondary | ICD-10-CM

## 2013-09-07 DIAGNOSIS — I4891 Unspecified atrial fibrillation: Secondary | ICD-10-CM

## 2013-09-07 LAB — HEMOGLOBIN A1C
Hgb A1c MFr Bld: 5.4 % (ref <5.7)
Mean Plasma Glucose: 108 mg/dL (ref <117)

## 2013-09-07 LAB — POCT URINALYSIS DIPSTICK
Bilirubin, UA: NEGATIVE
Blood, UA: NEGATIVE
Glucose, UA: NEGATIVE
Leukocytes, UA: NEGATIVE
Nitrite, UA: NEGATIVE
Protein, UA: NEGATIVE
Urobilinogen, UA: 0.2

## 2014-01-11 ENCOUNTER — Ambulatory Visit (INDEPENDENT_AMBULATORY_CARE_PROVIDER_SITE_OTHER): Payer: BC Managed Care – PPO | Admitting: Cardiology

## 2014-01-11 ENCOUNTER — Encounter: Payer: Self-pay | Admitting: Cardiology

## 2014-01-11 ENCOUNTER — Encounter: Payer: Self-pay | Admitting: *Deleted

## 2014-01-11 VITALS — BP 123/74 | HR 74 | Ht 64.5 in | Wt 188.2 lb

## 2014-01-11 DIAGNOSIS — I4891 Unspecified atrial fibrillation: Secondary | ICD-10-CM

## 2014-01-11 DIAGNOSIS — I480 Paroxysmal atrial fibrillation: Secondary | ICD-10-CM

## 2014-01-11 DIAGNOSIS — I10 Essential (primary) hypertension: Secondary | ICD-10-CM

## 2014-01-11 DIAGNOSIS — E785 Hyperlipidemia, unspecified: Secondary | ICD-10-CM

## 2014-01-11 MED ORDER — APIXABAN 5 MG PO TABS: 5.0000 mg | ORAL_TABLET | Freq: Two times a day (BID) | ORAL | Status: DC

## 2014-01-11 MED ORDER — METOPROLOL SUCCINATE ER 50 MG PO TB24: ORAL_TABLET | ORAL | Status: DC

## 2014-01-11 NOTE — Progress Notes (Signed)
HPI: FU PAF. Echocardiogram in February of 2010 showed normal LV function, mild left ventricular hypertrophy with impaired relaxation and mild aortic valve calcification. Nuclear study in February of 2010 showed an ejection fraction of 75% and normal perfusion. Patient last seen in Oct 2014. Since then, Over the past month she has had increasing frequency of atrial fibrillation. Her episodes last 10 minutes to hours. The longer episodes are associated with dizziness, fatigue, dyspnea and chest tightness. When she is not having atrial fibrillation she has some dyspnea on exertion but no orthopnea, PND, pedal edema, exertional chest pain or syncope.   Current Outpatient Prescriptions  Medication Sig Dispense Refill  . butalbital-acetaminophen-caffeine (FIORICET) 50-325-40 MG per tablet Take 1-2 tablets by mouth every 6 (six) hours as needed for headache.  30 tablet  1  . Cholecalciferol (VITAMIN D PO) Take 5,000 Units by mouth daily.      Marland Kitchen LORazepam (ATIVAN) 2 MG tablet 0.5 mg.       . metoprolol succinate (TOPROL-XL) 50 MG 24 hr tablet TAKE ONE AND ONE HALF TABLETS ONCE DAILY Take with or immediately following a meal.  135 tablet  3  . QUEtiapine (SEROQUEL) 25 MG tablet Take 25 mg by mouth at bedtime.      . rosuvastatin (CRESTOR) 10 MG tablet TAKE ONE TABLET BY MOUTH DAILY  30 tablet  8  . sertraline (ZOLOFT) 100 MG tablet TAKE 1 TABLET BY MOUTH DAILY  90 tablet  PRN  . apixaban (ELIQUIS) 5 MG TABS tablet Take 1 tablet (5 mg total) by mouth 2 (two) times daily.  60 tablet  6   No current facility-administered medications for this visit.     Past Medical History  Diagnosis Date  . PAF (paroxysmal atrial fibrillation)   . Hyperlipidemia   . HTN (hypertension)   . Aortic valve calcification     Mild per echo in February of 2010  . Obesity   . History of hiatal hernia     with repair  . Diverticulosis   . Chronic headaches   . Depression   . GERD (gastroesophageal reflux  disease)     Past Surgical History  Procedure Laterality Date  . Hernia repair  2008    nissan fundiplication  . Eye surgery      L-yey myopia  . Knee arthroscopy  2005    R-knee  . Elbow surgery  left    Tendon Surgery  . Tubal ligation    . Tonsillectomy    . Colonoscopy    . Upper gastrointestinal endoscopy    . Laparoscopic nissen fundoplication      History   Social History  . Marital Status: Legally Separated    Spouse Name: N/A    Number of Children: N/A  . Years of Education: N/A   Occupational History  . Not on file.   Social History Main Topics  . Smoking status: Former Smoker    Types: Cigarettes    Quit date: 09/06/1977  . Smokeless tobacco: Never Used  . Alcohol Use: No     Comment: once a month  . Drug Use: No  . Sexual Activity: Not on file   Other Topics Concern  . Not on file   Social History Narrative  . No narrative on file    ROS: no fevers or chills, productive cough, hemoptysis, dysphasia, odynophagia, melena, hematochezia, dysuria, hematuria, rash, seizure activity, orthopnea, PND, pedal edema, claudication. Remaining systems are negative.  Physical  Exam: Well-developed well-nourished in no acute distress.  Skin is warm and dry.  HEENT is normal.  Neck is supple.  Chest is clear to auscultation with normal expansion.  Cardiovascular exam is regular rate and rhythm.  Abdominal exam nontender or distended. No masses palpated. Extremities show no edema. neuro grossly intact  ECG Sinus rhythm with no ST changes.

## 2014-01-11 NOTE — Patient Instructions (Signed)
Your physician wants you to follow-up in: [address] [place] will receive a reminder letter in the mail two months in advance. If you don't receive a letter, please call our office to schedule the follow-up appointment.   STOP ASPIRIN  START ELIQUIS 5 MG TWICE DAILY  Your physician recommends that you return for lab work in: [place] has requested that you have an echocardiogram. Echocardiography is a painless test that uses sound waves to create images of your heart. It provides your doctor with information about the size and shape of your heart and how well your heart's chambers and valves are working. This procedure takes approximately one hour. There are no restrictions for this procedure.   Your physician has recommended that you wear an event monitor. Event monitors are medical devices that record the heart's electrical activity. Doctors most often [place] these monitors to diagnose arrhythmias. Arrhythmias are problems with the speed or rhythm of the heartbeat. The monitor is a small, portable device. You can wear one while you do your normal daily activities. This is usually used to diagnose what is causing palpitations/syncope (passing out).   STOP LOSARTAN  INCREASE METOPROLOL TO 75 MG ONCE DAILY= ONE AND ONE HALF OF 50 MG TABLET

## 2014-01-11 NOTE — Assessment & Plan Note (Signed)
She is having increasing episodes of atrial fibrillation. These are extremely symptomatic. Plan repeat echocardiogram. Embolic risk factors of hypertension and female sex. She is now agreeable to anticoagulation. Discontinue aspirin. Begin apixaban 5 mg by mouth twice a day. We will check hemoglobin, renal function and TSH in 4 weeks. Increase Toprol to 75 mg by mouth twice a day. Plan monitor to document rhythm disturbance. If she has increasing frequency of episodes despite increase beta blocker we will consider antiarrhythmic such as flecainide. If she fails antiarrhythmic we could refer for ablation.

## 2014-01-11 NOTE — Assessment & Plan Note (Signed)
Continue statin. Monitored by primary care.

## 2014-01-11 NOTE — Assessment & Plan Note (Signed)
Given that we are increasing beta blocker I will discontinue Cozaar. Monitor blood pressure and adjust regimen as needed.

## 2014-01-22 ENCOUNTER — Other Ambulatory Visit: Payer: Self-pay | Admitting: Internal Medicine

## 2014-01-24 ENCOUNTER — Encounter (INDEPENDENT_AMBULATORY_CARE_PROVIDER_SITE_OTHER): Payer: BC Managed Care – PPO

## 2014-01-24 ENCOUNTER — Encounter: Payer: Self-pay | Admitting: *Deleted

## 2014-01-24 ENCOUNTER — Ambulatory Visit (HOSPITAL_COMMUNITY): Payer: BC Managed Care – PPO | Attending: Cardiovascular Disease | Admitting: Cardiology

## 2014-01-24 DIAGNOSIS — I480 Paroxysmal atrial fibrillation: Secondary | ICD-10-CM

## 2014-01-24 DIAGNOSIS — I4891 Unspecified atrial fibrillation: Secondary | ICD-10-CM

## 2014-01-24 NOTE — Progress Notes (Signed)
Echo performed. 

## 2014-01-24 NOTE — Progress Notes (Signed)
Patient ID: Olivia Reynolds, female   DOB: 1951-03-05, 63 y.o.   MRN: [id number] E-Cardio verite 30 day cardiac event monitor applied to patient.

## 2014-02-05 ENCOUNTER — Other Ambulatory Visit: Payer: BC Managed Care – PPO

## 2014-02-07 ENCOUNTER — Other Ambulatory Visit: Payer: BC Managed Care – PPO

## 2014-02-08 ENCOUNTER — Telehealth: Payer: Self-pay | Admitting: Radiology

## 2014-02-08 DIAGNOSIS — I480 Paroxysmal atrial fibrillation: Secondary | ICD-10-CM

## 2014-02-08 MED ORDER — METOPROLOL SUCCINATE ER 50 MG PO TB24: ORAL_TABLET | ORAL | Status: DC

## 2014-02-08 NOTE — Telephone Encounter (Signed)
Pt left voicemail stating she is having blisters from her E Cardio monitor. Pt has been wearing monitor for 15 days and has tried sensitive electrodes. Can she take off? Also wants to confirm the dosage of her toprol. She has been taking 75mg  in morning and at night.

## 2014-02-08 NOTE — Telephone Encounter (Signed)
Spoke with pt, according to dr Stanford Breed last office note, pt to cont on metoprolol succ 75 mg twice daily

## 2014-02-20 ENCOUNTER — Other Ambulatory Visit (INDEPENDENT_AMBULATORY_CARE_PROVIDER_SITE_OTHER): Payer: BC Managed Care – PPO

## 2014-02-20 DIAGNOSIS — I480 Paroxysmal atrial fibrillation: Secondary | ICD-10-CM

## 2014-02-20 DIAGNOSIS — I4891 Unspecified atrial fibrillation: Secondary | ICD-10-CM

## 2014-02-20 LAB — BASIC METABOLIC PANEL
BUN: 18 mg/dL (ref 6–23)
CALCIUM: 9.5 mg/dL (ref 8.4–10.5)
CO2: 28 mEq/L (ref 19–32)
Chloride: 107 mEq/L (ref 96–112)
Creatinine, Ser: 0.7 mg/dL (ref 0.4–1.2)
GFR: 97.82 mL/min (ref >60.00)
GLUCOSE: 103 mg/dL — AB (ref 70–99)
POTASSIUM: 4.1 meq/L (ref 3.5–5.1)
SODIUM: 142 meq/L (ref 135–145)

## 2014-02-20 LAB — CBC WITH DIFFERENTIAL/PLATELET
Basophils Absolute: 0.1 10*3/uL (ref 0.0–0.1)
Basophils Relative: 0.8 % (ref 0.0–3.0)
EOS PCT: 6.4 % — AB (ref 0.0–5.0)
Eosinophils Absolute: 0.4 10*3/uL (ref 0.0–0.7)
HEMATOCRIT: 37.2 % (ref 36.0–46.0)
HEMOGLOBIN: 12.4 g/dL (ref 12.0–15.0)
Lymphocytes Relative: 33.7 % (ref 12.0–46.0)
Lymphs Abs: 2.1 10*3/uL (ref 0.7–4.0)
MCHC: 33.4 g/dL (ref 30.0–36.0)
MCV: 90.9 fl (ref 78.0–100.0)
MONO ABS: 0.4 10*3/uL (ref 0.1–1.0)
Monocytes Relative: 5.9 % (ref 3.0–12.0)
NEUTROS PCT: 53.2 % (ref 43.0–77.0)
Neutro Abs: 3.3 10*3/uL (ref 1.4–7.7)
PLATELETS: 290 10*3/uL (ref 150.0–400.0)
RBC: 4.09 Mil/uL (ref 3.87–5.11)
RDW: 13.4 % (ref 11.5–15.5)
WBC: 6.3 10*3/uL (ref 4.0–10.5)

## 2014-02-20 LAB — TSH: TSH: 1.38 u[IU]/mL (ref 0.35–4.50)

## 2014-02-25 ENCOUNTER — Encounter: Payer: Self-pay | Admitting: Internal Medicine

## 2014-02-25 NOTE — Patient Instructions (Signed)
Watch  blood pressure at home. Return in one year or as needed. Continue same medications. Diet and exercise.

## 2014-02-25 NOTE — Progress Notes (Signed)
Subjective:    Patient ID: Olivia Reynolds, female    DOB: 1951/03/15, 63 y.o.   MRN: [id number]  HPI 63 year old White female in today for health maintenance and evaluation of medical issues. Patient is followed by cardiologist for history of paroxysmal atrial fibrillation. She has a history of hypertension. She had it 2-D echocardiogram February 2010 showing normal left ventricular function, mild LVH with impaired relaxation and mild aortic valve calcification. Nuclear study in February 2010 showed an ejection fraction of 75% and normal perfusion. She also has a history of hyperlipidemia and obesity.   History of migraine headaches. Had colonoscopy 2008 by Dr. Delia Chimes colon polyp removed was hyperplastic. Had endoscopy January 2009 showing a 9 cm hiatal hernia which was thought to be causing chest pain at that time and subsequently Dr. Hassell Done repaired this in April 2009. She had a large type III mixed title hernia containing about half of the stomach. She had laparoscopic fundoplication. History of knee surgery in 2004. Surgery for epicondylitis of the elbow in 2002. Bilateral tubal ligation 1986. 3 pregnancies and no miscarriages.  Social history: She does not smoke. Social alcohol consumption consisting of wine. 3 adult children-a daughter and 2 sons. She is divorced and resides alone. She is Development worker, [employer] of [employer].  Family history: Mother died of complications of COPD. Father deceased with history of dementia, hypertension and stroke. Sister with history of diabetes and hypertension subsequently had bariatric surgery. Another sister in good health. Once brother with history of kidney stones, gout and allergies.    Review of Systems  Constitutional: Positive for fatigue.  HENT: Negative.   Eyes: Negative.   Respiratory: Negative.   Cardiovascular:       History of paroxysmal atrial fibrillation  Endocrine:       Hyperlipidemia    Neurological:       History of migraine headaches  Psychiatric/Behavioral:       Anxiety depression       Objective:   Physical Exam  Vitals reviewed. Constitutional: She is oriented to person, place, and time. She appears well-developed and well-nourished. No distress.  HENT:  Head: Normocephalic and atraumatic.  Right Ear: External ear normal.  Left Ear: External ear normal.  Mouth/Throat: Oropharynx is clear and moist. No oropharyngeal exudate.  Eyes: Conjunctivae and EOM are normal. Pupils are equal, round, and reactive to light. Right eye exhibits no discharge. Left eye exhibits no discharge. No scleral icterus.  Neck: No JVD present. No thyromegaly present.  Cardiovascular: Normal rate, regular rhythm, normal heart sounds and intact distal pulses.   No murmur heard. Pulmonary/Chest: Effort normal and breath sounds normal. No respiratory distress. She has no wheezes. She has no rales. She exhibits no tenderness.  Breasts normal female  Abdominal: Soft. Bowel sounds are normal. She exhibits no distension and no mass. There is no tenderness. There is no rebound and no guarding.  Genitourinary:  Deferred to GYN  Neurological: She is alert and oriented to person, place, and time. She has normal reflexes. She displays normal reflexes. No cranial nerve deficit. Coordination normal.  Skin: Skin is warm and dry. No rash noted. She is not diaphoretic.  Psychiatric: She has a normal mood and affect. Her behavior is normal. Judgment and thought content normal.          Assessment & Plan:  Hypertension-stable on current regimen  History of paroxysmal atrial fibrillation  History of migraine headaches  Hyperlipidemia  Anxiety depression  Status post Nissen fundoplication  Plan: Continue same medications and return in one year. Encouraged diet exercise and weight loss. Patient is currently taking one aspirin daily. Prescription given for Zostavax. She may have this done at  pharmacy. Add hemoglobin A1c. Monitor blood pressure. It is elevated today at 148/82 and let me know if it is not staying better control.

## 2014-02-26 ENCOUNTER — Telehealth: Payer: Self-pay | Admitting: *Deleted

## 2014-02-26 NOTE — Telephone Encounter (Signed)
Monitor reviewed by dr Stanford Breed shows sinus. Left message of results for pt

## 2014-02-27 ENCOUNTER — Encounter: Payer: Self-pay | Admitting: Cardiology

## 2014-02-27 NOTE — Telephone Encounter (Signed)
This encounter was created in error - please disregard.

## 2014-02-27 NOTE — Telephone Encounter (Signed)
New message     Want monitor results 

## 2014-03-17 ENCOUNTER — Other Ambulatory Visit: Payer: Self-pay | Admitting: Cardiology

## 2014-03-26 ENCOUNTER — Ambulatory Visit: Payer: BC Managed Care – PPO | Admitting: Internal Medicine

## 2014-03-28 ENCOUNTER — Encounter: Payer: Self-pay | Admitting: Gynecology

## 2014-03-29 ENCOUNTER — Ambulatory Visit (INDEPENDENT_AMBULATORY_CARE_PROVIDER_SITE_OTHER): Payer: BC Managed Care – PPO | Admitting: Internal Medicine

## 2014-03-29 ENCOUNTER — Other Ambulatory Visit: Payer: Self-pay | Admitting: Internal Medicine

## 2014-03-29 ENCOUNTER — Encounter: Payer: Self-pay | Admitting: Internal Medicine

## 2014-03-29 VITALS — BP 146/90 | HR 92 | Temp 97.6°F | Wt 206.0 lb

## 2014-03-29 DIAGNOSIS — M7918 Myalgia, other site: Secondary | ICD-10-CM

## 2014-03-29 DIAGNOSIS — R5383 Other fatigue: Secondary | ICD-10-CM

## 2014-03-29 DIAGNOSIS — R609 Edema, unspecified: Secondary | ICD-10-CM

## 2014-03-29 DIAGNOSIS — R5381 Other malaise: Secondary | ICD-10-CM

## 2014-03-29 DIAGNOSIS — IMO0001 Reserved for inherently not codable concepts without codable children: Secondary | ICD-10-CM

## 2014-03-29 LAB — CBC WITH DIFFERENTIAL/PLATELET
BASOS ABS: 0.1 10*3/uL (ref 0.0–0.1)
Basophils Relative: 1 % (ref 0–1)
EOS PCT: 5 % (ref 0–5)
Eosinophils Absolute: 0.4 10*3/uL (ref 0.0–0.7)
HCT: 38.5 % (ref 36.0–46.0)
Hemoglobin: 13.4 g/dL (ref 12.0–15.0)
LYMPHS ABS: 2.6 10*3/uL (ref 0.7–4.0)
Lymphocytes Relative: 36 % (ref 12–46)
MCH: 30 pg (ref 26.0–34.0)
MCHC: 34.8 g/dL (ref 30.0–36.0)
MCV: 86.3 fL (ref 78.0–100.0)
Monocytes Absolute: 0.5 10*3/uL (ref 0.1–1.0)
Monocytes Relative: 7 % (ref 3–12)
Neutro Abs: 3.7 10*3/uL (ref 1.7–7.7)
Neutrophils Relative %: 51 % (ref 43–77)
PLATELETS: 282 10*3/uL (ref 150–400)
RBC: 4.46 MIL/uL (ref 3.87–5.11)
RDW: 13.5 % (ref 11.5–15.5)
WBC: 7.2 10*3/uL (ref 4.0–10.5)

## 2014-03-29 MED ORDER — FUROSEMIDE 20 MG PO TABS: 20.0000 mg | ORAL_TABLET | Freq: Every day | ORAL | Status: DC

## 2014-03-29 MED ORDER — FLUOXETINE HCL 20 MG PO TABS: 20.0000 mg | ORAL_TABLET | Freq: Every day | ORAL | Status: DC

## 2014-03-29 NOTE — Patient Instructions (Signed)
Discontinue Seroquel. Take Prozac 20 mg daily. Start Lasix 20 mg daily for edema. Return in 2 weeks. Lab work is pending for rheumatology issues

## 2014-03-30 LAB — COMPREHENSIVE METABOLIC PANEL
ALT: 40 U/L — ABNORMAL HIGH (ref 0–35)
AST: 35 U/L (ref 0–37)
Albumin: 4.3 g/dL (ref 3.5–5.2)
Alkaline Phosphatase: 45 U/L (ref 39–117)
BILIRUBIN TOTAL: 0.8 mg/dL (ref 0.2–1.2)
BUN: 12 mg/dL (ref 6–23)
CO2: 26 meq/L (ref 19–32)
Calcium: 9.5 mg/dL (ref 8.4–10.5)
Chloride: 108 mEq/L (ref 96–112)
Creat: 0.61 mg/dL (ref 0.50–1.10)
Glucose, Bld: 90 mg/dL (ref 70–99)
Potassium: 4.1 mEq/L (ref 3.5–5.3)
Sodium: 141 mEq/L (ref 135–145)
Total Protein: 6.6 g/dL (ref 6.0–8.3)

## 2014-03-30 LAB — CK TOTAL AND CKMB (NOT AT ARMC)
CK TOTAL: 61 U/L (ref 7–177)
CK, MB: 1.4 ng/mL (ref 0.0–5.0)

## 2014-03-30 LAB — TSH: TSH: 1.354 u[IU]/mL (ref 0.350–4.500)

## 2014-03-30 LAB — URIC ACID: Uric Acid, Serum: 3.4 mg/dL (ref 2.4–7.0)

## 2014-03-30 LAB — SEDIMENTATION RATE: Sed Rate: 5 mm/hr (ref 0–22)

## 2014-03-30 LAB — RHEUMATOID FACTOR: Rhuematoid fact SerPl-aCnc: <10 IU/mL (ref <=14)

## 2014-04-01 ENCOUNTER — Encounter (HOSPITAL_COMMUNITY): Payer: Self-pay | Admitting: Emergency Medicine

## 2014-04-01 ENCOUNTER — Emergency Department (HOSPITAL_COMMUNITY)
Admission: EM | Admit: 2014-04-01 | Discharge: 2014-04-01 | Disposition: A | Discharge status: Home / Self Care | Payer: BC Managed Care – PPO | Attending: Emergency Medicine | Admitting: Emergency Medicine

## 2014-04-01 ENCOUNTER — Telehealth: Payer: Self-pay | Admitting: Internal Medicine

## 2014-04-01 DIAGNOSIS — Z79899 Other long term (current) drug therapy: Secondary | ICD-10-CM | POA: Insufficient documentation

## 2014-04-01 DIAGNOSIS — E669 Obesity, unspecified: Secondary | ICD-10-CM | POA: Insufficient documentation

## 2014-04-01 DIAGNOSIS — Z9889 Other specified postprocedural states: Secondary | ICD-10-CM | POA: Insufficient documentation

## 2014-04-01 DIAGNOSIS — E860 Dehydration: Secondary | ICD-10-CM | POA: Insufficient documentation

## 2014-04-01 DIAGNOSIS — F3289 Other specified depressive episodes: Secondary | ICD-10-CM | POA: Insufficient documentation

## 2014-04-01 DIAGNOSIS — G8929 Other chronic pain: Secondary | ICD-10-CM | POA: Insufficient documentation

## 2014-04-01 DIAGNOSIS — I10 Essential (primary) hypertension: Secondary | ICD-10-CM | POA: Insufficient documentation

## 2014-04-01 DIAGNOSIS — F329 Major depressive disorder, single episode, unspecified: Secondary | ICD-10-CM | POA: Insufficient documentation

## 2014-04-01 DIAGNOSIS — F19939 Other psychoactive substance use, unspecified with withdrawal, unspecified: Secondary | ICD-10-CM | POA: Insufficient documentation

## 2014-04-01 DIAGNOSIS — Z8719 Personal history of other diseases of the digestive system: Secondary | ICD-10-CM | POA: Insufficient documentation

## 2014-04-01 DIAGNOSIS — F1920 Other psychoactive substance dependence, uncomplicated: Secondary | ICD-10-CM | POA: Insufficient documentation

## 2014-04-01 DIAGNOSIS — E785 Hyperlipidemia, unspecified: Secondary | ICD-10-CM | POA: Insufficient documentation

## 2014-04-01 DIAGNOSIS — Z87891 Personal history of nicotine dependence: Secondary | ICD-10-CM | POA: Insufficient documentation

## 2014-04-01 LAB — URINALYSIS, ROUTINE W REFLEX MICROSCOPIC
Bilirubin Urine: NEGATIVE
GLUCOSE, UA: NEGATIVE mg/dL
Hgb urine dipstick: NEGATIVE
Ketones, ur: NEGATIVE mg/dL
Nitrite: NEGATIVE
Protein, ur: NEGATIVE mg/dL
SPECIFIC GRAVITY, URINE: 1.015 (ref 1.005–1.030)
Urobilinogen, UA: 0.2 mg/dL (ref 0.0–1.0)
pH: 5.5 (ref 5.0–8.0)

## 2014-04-01 LAB — CBC WITH DIFFERENTIAL/PLATELET
BASOS ABS: 0.1 10*3/uL (ref 0.0–0.1)
Basophils Relative: 1 % (ref 0–1)
Eosinophils Absolute: 0.4 10*3/uL (ref 0.0–0.7)
Eosinophils Relative: 4 % (ref 0–5)
HEMATOCRIT: 40.2 % (ref 36.0–46.0)
Hemoglobin: 13.5 g/dL (ref 12.0–15.0)
LYMPHS PCT: 22 % (ref 12–46)
Lymphs Abs: 2.4 10*3/uL (ref 0.7–4.0)
MCH: 29.9 pg (ref 26.0–34.0)
MCHC: 33.6 g/dL (ref 30.0–36.0)
MCV: 89.1 fL (ref 78.0–100.0)
MONO ABS: 0.6 10*3/uL (ref 0.1–1.0)
Monocytes Relative: 6 % (ref 3–12)
NEUTROS ABS: 7.2 10*3/uL (ref 1.7–7.7)
Neutrophils Relative %: 67 % (ref 43–77)
Platelets: 292 10*3/uL (ref 150–400)
RBC: 4.51 MIL/uL (ref 3.87–5.11)
RDW: 12.7 % (ref 11.5–15.5)
WBC: 10.6 10*3/uL — AB (ref 4.0–10.5)

## 2014-04-01 LAB — COMPREHENSIVE METABOLIC PANEL
ALBUMIN: 4.2 g/dL (ref 3.5–5.2)
ALT: 48 U/L — AB (ref 0–35)
AST: 36 U/L (ref 0–37)
Alkaline Phosphatase: 52 U/L (ref 39–117)
Anion gap: 16 — ABNORMAL HIGH (ref 5–15)
BUN: 13 mg/dL (ref 6–23)
CALCIUM: 9.7 mg/dL (ref 8.4–10.5)
CO2: 24 meq/L (ref 19–32)
CREATININE: 0.68 mg/dL (ref 0.50–1.10)
Chloride: 103 mEq/L (ref 96–112)
GFR calc Af Amer: >90 mL/min (ref >90)
Glucose, Bld: 121 mg/dL — ABNORMAL HIGH (ref 70–99)
Potassium: 3.8 mEq/L (ref 3.7–5.3)
SODIUM: 143 meq/L (ref 137–147)
TOTAL PROTEIN: 7.9 g/dL (ref 6.0–8.3)
Total Bilirubin: 1 mg/dL (ref 0.3–1.2)

## 2014-04-01 LAB — LIPASE, BLOOD: LIPASE: 43 U/L (ref 11–59)

## 2014-04-01 LAB — URINE MICROSCOPIC-ADD ON

## 2014-04-01 MED ORDER — ONDANSETRON HCL 4 MG PO TABS: 4.0000 mg | ORAL_TABLET | Freq: Four times a day (QID) | ORAL | Status: DC

## 2014-04-01 MED ORDER — SODIUM CHLORIDE 0.9 % IV BOLUS (SEPSIS)
1000.0000 mL | Freq: Once | INTRAVENOUS | Status: AC
  Administered 2014-04-01: 1000 mL via INTRAVENOUS

## 2014-04-01 MED ORDER — ONDANSETRON HCL 4 MG/2ML IJ SOLN
4.0000 mg | Freq: Once | INTRAMUSCULAR | Status: AC
  Administered 2014-04-01: 4 mg via INTRAVENOUS
  Filled 2014-04-01: qty 2

## 2014-04-01 NOTE — ED Notes (Signed)
Pt c/o intermittent n/v/d that has been on going for about a month, sts at some points she also feels like she is off balance, as if she is on a boat. Pt reports she has had a lot of medication changes the past 2 months and ever since then she just hasn't felt right. Pt has been able to keep a few sips of water down but sts today she hasn't been able to eat without vomiting. Denies blood in emesis/stool. Denies abd pain. Nad, skin warm and dry, resp e/u.

## 2014-04-01 NOTE — ED Notes (Signed)
Pt. Stated, I  started feeling sick Friday night and yesterday it was worse.  I've been sick on my stomach with N/V and feeling disoriented.  I've also had chills and then hot.

## 2014-04-01 NOTE — Telephone Encounter (Signed)
Pt called saying vomiting and headache returned. Asked pt to meet me at office. BP 160/100, pulse regular 80, T 98.6. Saying her head felt like it was on fire. Given 25 mg Phenergan IM in office and Rx for Phenergan suppositories which she can take 50 mg pr q 6 hour prn nausea. Stay with clear liquids.  Stay out of work tomorrow. Take 200 mg Seroquel tonight and one Prozac 20 mg after using Phenergan suppository and waiting about an hour. May need to taper Seroquel.

## 2014-04-01 NOTE — Telephone Encounter (Signed)
Called to see how patient was doing at 3 PM. She's been discharged from Emergency Department. Received Zofran and IV fluids. Feeling better. Does not want to go back on Seroquel or Prozac. Does not want to see Dr. Caprice Beaver tomorrow for medication management. She is to call if she has recurrent symptoms. Has prescription for oral Zofran from ED

## 2014-04-01 NOTE — ED Provider Notes (Signed)
CSN: [id number]     Arrival date & time 04/01/14  1005 History   First MD Initiated Contact with Patient 04/01/14 1021     Chief Complaint  Patient presents with  . Emesis  . Nausea     (Consider location/radiation/quality/duration/timing/severity/associated sxs/prior Treatment) HPI Comments: About 1-2 months ago pt was taken off zoloft cold [place] and placed on seroquel and has had intermittent sx over the last month.  Pt saw PCP Friday for general malaise, body aches and joint pain.  Worked up for RA, lupus and rhabdo all which was normal and taken off seroquel which she had been on for 2 months and started on prozac.  For the last 48 hours pt has had chills, vomiting, diarrhea lightheadedness with walking.  Denies eating or drinking anything today.  No focal weakness and c/o that her body hurts.  Patient is a 63 y.o. female presenting with vomiting. The history is provided by the patient.  Emesis Severity:  Moderate Duration: intermittent for the last 1 1/2 months. but worse yesterday. Timing:  Constant Number of daily episodes:  Numerous Quality:  Stomach contents Progression:  Partially resolved Chronicity:  Recurrent Recent urination:  Normal Relieved by:  None tried Worsened by:  Liquids and food smell Associated symptoms: arthralgias, chills, diarrhea and myalgias   Associated symptoms: no abdominal pain, no cough, no fever, no sore throat and no URI   Risk factors: no alcohol use, no prior abdominal surgery, no sick contacts, no suspect food intake and no travel to endemic areas     Past Medical History  Diagnosis Date  . PAF (paroxysmal atrial fibrillation)   . Hyperlipidemia   . HTN (hypertension)   . Aortic valve calcification     Mild per echo in February of 2010  . Obesity   . History of hiatal hernia     with repair  . Diverticulosis   . Chronic headaches   . Depression   . GERD (gastroesophageal reflux disease)    Past Surgical History  Procedure  Laterality Date  . Hernia repair  2008    nissan fundiplication  . Eye surgery      L-yey myopia  . Knee arthroscopy  2005    R-knee  . Elbow surgery  left    Tendon Surgery  . Tubal ligation    . Tonsillectomy    . Colonoscopy    . Upper gastrointestinal endoscopy    . Laparoscopic nissen fundoplication     Family History  Problem Relation Age of Onset  . COPD Mother   . Asthma Mother   . Arthritis Father   . Hypertension Father   . Stroke Father   . Cancer Father   . Prostate cancer Father   . Diabetes      sibling  . Hypertension Sister   . Diabetes Sister   . Hypertension Brother   . Cancer Sister     UTERINE  . Cancer Son     TESTICULAR  . Crohn's disease Daughter    History  Substance Use Topics  . Smoking status: Former Smoker    Types: Cigarettes    Quit date: 09/06/1977  . Smokeless tobacco: Never Used  . Alcohol Use: No     Comment: once a month   OB History   Grav Para Term Preterm Abortions TAB SAB Ect Mult Living   3 3        3      Review of Systems  Constitutional: Positive  for chills.  HENT: Negative for sore throat.   Gastrointestinal: Positive for nausea, vomiting and diarrhea. Negative for abdominal pain.  Musculoskeletal: Positive for arthralgias and myalgias. Negative for gait problem.  Skin: Negative for rash.  Neurological: Positive for light-headedness.  All other systems reviewed and are negative.     Allergies  Codeine; Erythromycin; Statins; and Hctz  Home Medications   Prior to Admission medications   Medication Sig Start Date End Date Taking? Authorizing Provider  apixaban (ELIQUIS) 5 MG TABS tablet Take 1 tablet (5 mg total) by mouth 2 (two) times daily. 01/11/14  Yes Lelon Perla, MD  Cholecalciferol (VITAMIN D PO) Take 5,000 Units by mouth daily.   Yes Historical Provider, MD  FLUoxetine (PROZAC) 20 MG tablet Take 1 tablet (20 mg total) by mouth daily. 03/29/14  Yes Elby Showers, MD  metoprolol succinate  (TOPROL-XL) 50 MG 24 hr tablet Take 75 mg by mouth daily.  02/08/14  Yes Lelon Perla, MD  Polyethyl Glycol-Propyl Glycol (SYSTANE OP) Apply 1 drop to eye daily as needed (for sry eyes).   Yes Historical Provider, MD  rosuvastatin (CRESTOR) 10 MG tablet TAKE 1 TABLET BY MOUTH EVERY DAY   Yes Lelon Perla, MD  furosemide (LASIX) 20 MG tablet Take 1 tablet (20 mg total) by mouth daily. 03/29/14   Elby Showers, MD   BP 131/66  Pulse 83  Temp(Src) 98.7 F (37.1 C) (Oral)  Resp 16  SpO2 97% Physical Exam  Nursing note and vitals reviewed. Constitutional: She is oriented to person, place, and time. She appears well-developed and well-nourished. No distress.  HENT:  Head: Normocephalic and atraumatic.  Mouth/Throat: Oropharynx is clear and moist.  Eyes: Conjunctivae and EOM are normal. Pupils are equal, round, and reactive to light.  Neck: Normal range of motion. Neck supple.  Cardiovascular: Normal rate, regular rhythm and intact distal pulses.   No murmur heard. Pulmonary/Chest: Effort normal and breath sounds normal. No respiratory distress. She has no wheezes. She has no rales.  Abdominal: Soft. She exhibits no distension. There is no tenderness. There is no rebound and no guarding.  Musculoskeletal: Normal range of motion. She exhibits no edema and no tenderness.  No notable joint pain or swelling  Neurological: She is alert and oriented to person, place, and time.  Skin: Skin is warm and dry. No rash noted. No erythema.  Psychiatric: She has a normal mood and affect. Her behavior is normal.    ED Course  Procedures (including critical care time) Labs Review Labs Reviewed  CBC WITH DIFFERENTIAL - Abnormal; Notable for the following:    WBC 10.6 (*)    All other components within normal limits  COMPREHENSIVE METABOLIC PANEL - Abnormal; Notable for the following:    Glucose, Bld 121 (*)    ALT 48 (*)    Anion gap 16 (*)    All other components within normal limits   URINALYSIS, ROUTINE W REFLEX MICROSCOPIC - Abnormal; Notable for the following:    Leukocytes, UA TRACE (*)    All other components within normal limits  URINE MICROSCOPIC-ADD ON - Abnormal; Notable for the following:    Casts HYALINE CASTS (*)    All other components within normal limits  LIPASE, BLOOD    Imaging Review No results found.   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 Patient had been on Zoloft for 18 years and then about 1-1/2 months ago she was stopped cold [place] and placed on Seroquel. Since that time she has had the above symptoms. Then 2 days prior to arrival patient saw her PCP and had blood testing done ruling out lupus, rheumatoid arthritis or inflammatory conditions with a normal sedimentation rate. At that time patient was placed on Prozac and Seroquel was stopped. She had been on the Seroquel for 2 months. Since that time patient has had worsening chills, sweats, vomiting and diarrhea. She denies any abdominal pain, chest pain or fever. She is describing diffuse body aches.  Denies rash, tick bites or other infectious symptoms. She denies any urinary symptoms.  Given normal blood work done 3 days ago and normal CBC, CMP and lipase here feel that this is most likely related to medication withdrawal. Patient is dehydrated here and was given IV fluids and Zofran. Low suspicion for stroke.  12:44 PM Pt feeling better after fluids and zofran.  Pt's sx are all consistent with withdrawal from seroquel and zoloft.  Findings discussed with pt.  Will d/c home with zofran.  Pt is stopping seroquel and prozac.  She has only taken 2 doses of prozac and has now been off seroquel for 3 days.  Blanchie Dessert,  MD 04/01/14 1256

## 2014-04-01 NOTE — Discharge Instructions (Signed)

## 2014-04-01 NOTE — Progress Notes (Addendum)
   Subjective:    Patient ID: Olivia Reynolds, female    DOB: April 14, 1951, 63 y.o.   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 Complains of myalgias and fatigue. Patient was placed on Seroquel a while back by nurse practitioner with Dr. Sheralyn Boatman because she was having issues sleeping. She is now up to 400 mg daily of Seroquel. Myalgias have gotten much worse recently. No documented fever. At one point she came off of Zoloft on to Seroquel and may have had some sort of serotonin discontinuation syndrome. It took several weeks for her to adjust. She had some nausea and vomiting and headache. She does have a history of migraine headaches. History of hypertension, hyperlipidemia paroxysmal atrial fibrillation.  Social history: She does not smoke. Social alcohol consumption. She is divorced. He has 3 adult children. Ex-husband has remarried.  Family history: See dictation December 2014    Review of Systems-as above     Objective:   Physical Exam  Vitals reviewed. Constitutional: She is oriented to person, place, and time. She appears well-developed and well-nourished. No distress.  HENT:  Head: Normocephalic and atraumatic.  Right Ear: External ear normal.  Left Ear: External ear normal.  Mouth/Throat: Oropharynx is clear and moist. No oropharyngeal exudate.  Eyes: Conjunctivae and EOM are normal. Pupils are equal, round, and reactive to light. Right eye exhibits no discharge. Left eye exhibits no discharge. No scleral icterus.  Neck: Neck supple. No JVD present. No thyromegaly present.  Cardiovascular: Normal rate, regular rhythm and normal heart sounds.   No murmur heard. Pulmonary/Chest: Effort normal and breath sounds normal. No respiratory distress. She has no wheezes.  Abdominal: Soft. Bowel sounds are normal. She exhibits no mass. There is no rebound and no guarding.  Musculoskeletal:    Trace pitting edema lower extremities  Lymphadenopathy:    She has no cervical adenopathy.  Neurological: She is alert and oriented to person, place, and time. She has normal reflexes. She displays normal reflexes. No cranial nerve deficit. Coordination normal.  Skin: Skin is warm and dry. No rash noted. She is not diaphoretic.  Psychiatric: She has a normal mood and affect. Her behavior is normal. Judgment and thought content normal.          Assessment & Plan:  Arthralgias and myalgias-? Rheumatology condition such as fibromyalgia, rheumatoid arthritis.  Fatigue-check thyroid functions  History of migraine headaches  Hypertension  Hyperlipidemia  Insomnia  Depression  Dependent edema  Plan: Rheumatology studies drawn as well as a CBC and TSH. Discontinue Seroquel and start Prozac 20 mg daily. Myalgias reported with Seroquel. Start Lasix 20 mg daily and return in 2 weeks

## 2014-04-02 ENCOUNTER — Telehealth: Payer: Self-pay | Admitting: Internal Medicine

## 2014-04-02 ENCOUNTER — Encounter: Payer: Self-pay | Admitting: Gynecology

## 2014-04-02 LAB — ANA: Anti Nuclear Antibody(ANA): NEGATIVE

## 2014-04-02 LAB — CYCLIC CITRUL PEPTIDE ANTIBODY, IGG: Cyclic Citrullin Peptide Ab: <2 U/mL (ref 0.0–5.0)

## 2014-04-02 NOTE — Telephone Encounter (Signed)
Are normal.  No evidence of rheumatoid arthritis, lupus, gout or thyroid disease.  Sed rate also normal.  Advised patient that labs will be mailed to her.  Patient instructed to please call the office back should she not continue to improve.  Patient states to please express her gratitude to Dr. Renold Genta for her service and care.

## 2014-04-03 LAB — ROCKY MTN SPOTTED FVR AB, IGM-BLOOD: ROCKY MTN SPOTTED FEVER, IGM: 0.26 IV

## 2014-04-03 LAB — LYME AB/WESTERN BLOT REFLEX: B burgdorferi Ab IgG+IgM: 0.32 {ISR}

## 2014-04-16 ENCOUNTER — Ambulatory Visit (INDEPENDENT_AMBULATORY_CARE_PROVIDER_SITE_OTHER): Payer: BC Managed Care – PPO | Admitting: Internal Medicine

## 2014-04-16 ENCOUNTER — Encounter: Payer: Self-pay | Admitting: Internal Medicine

## 2014-04-16 VITALS — BP 122/78 | HR 80 | Temp 98.9°F | Wt 200.0 lb

## 2014-04-16 DIAGNOSIS — F329 Major depressive disorder, single episode, unspecified: Secondary | ICD-10-CM

## 2014-04-16 DIAGNOSIS — R110 Nausea: Secondary | ICD-10-CM

## 2014-04-16 DIAGNOSIS — F3289 Other specified depressive episodes: Secondary | ICD-10-CM

## 2014-04-16 DIAGNOSIS — J069 Acute upper respiratory infection, unspecified: Secondary | ICD-10-CM

## 2014-04-16 DIAGNOSIS — F32A Depression, unspecified: Secondary | ICD-10-CM

## 2014-04-16 MED ORDER — HYDROCODONE-HOMATROPINE 5-1.5 MG/5ML PO SYRP: 5.0000 mL | ORAL_SOLUTION | Freq: Three times a day (TID) | ORAL | Status: DC | PRN

## 2014-04-16 MED ORDER — SERTRALINE HCL 50 MG PO TABS: 50.0000 mg | ORAL_TABLET | Freq: Every day | ORAL | Status: DC

## 2014-04-16 MED ORDER — PROMETHAZINE HCL 25 MG PO TABS: 25.0000 mg | ORAL_TABLET | Freq: Three times a day (TID) | ORAL | Status: DC | PRN

## 2014-04-16 MED ORDER — DIAZEPAM 10 MG PO TABS: 10.0000 mg | ORAL_TABLET | Freq: Every evening | ORAL | Status: DC | PRN

## 2014-04-16 MED ORDER — AZITHROMYCIN 250 MG PO TABS: ORAL_TABLET | ORAL | Status: DC

## 2014-05-11 ENCOUNTER — Telehealth: Payer: Self-pay | Admitting: Physician Assistant

## 2014-05-11 NOTE — Telephone Encounter (Signed)
Ms. Kniskern called answering service with recurrence of PAF. Dr. Stanford Breed increased her Toprol several months ago to 75mg  BID and she has been doing well on this. However, she has been under a lot of stress lately as her son has meningitis, she recently had enterovirus, and had difficulty sleeping last night. She thinks she developed recurrence of AF around 4am. HR was in the 120s. She took 50mg  of metoprolol at 5:15am and another 50mg  at 5:45 am. HR now in the 90s and feeling mildly SOB like she usually does in atrial fib. She also has a headache but no focal neuro symptoms. Since she only recently took the increased metoprolol, I have asked her to try to get some rest and let the metoprolol set in a little longer. If symptoms still persist within an hour or so, she should call the office back so the nurse can discuss next step with Dr. Stanford Breed as his last note discussed possible addition of flecainide. Alternatively might try rx of dilitazem since BP is preserved at 137/102. She denies any chest pain, nausea, presyncope or syncope. ER precautions reviewed. Pt verbalized understanding of plan.  Dayna Dunn PA-C

## 2014-05-27 ENCOUNTER — Other Ambulatory Visit: Payer: Self-pay | Admitting: Cardiology

## 2014-06-17 NOTE — Progress Notes (Signed)
   Subjective:    Patient ID: Olivia Reynolds, female    DOB: 12/24/1950, 63 y.o.   MRN: [id number]  HPI  Apparently has come down with an upper respiratory infection. Has continued to have issues with nausea and diarrhea which apparently apparently came about from abruptly stopping Seroquel.    Review of Systems     Objective:   Physical Exam  Skin warm and dry. Nodes none. Patient is alert and oriented. Judgment is appropriate. TMs are slightly full. Pharynx slightly injected. Neck supple. Chest clear.      Assessment & Plan:  Acute URI Depression  Plan: Zithromax Z-Pak take as corrected. Hycodan syrup 1 teaspoon by mouth every 8 hours when necessary cough. Phenergan 25 mg tablets as needed for nausea. Start Zoloft 50 mg daily which she has taken before. Call with progress report in 2 weeks.  25 minutes spent with patient

## 2014-06-17 NOTE — Patient Instructions (Signed)
Take Phenergan as needed for nausea. Takes Zithromax and Hycodan for respiratory infection. Start Zoloft 50 mg daily.

## 2014-06-18 ENCOUNTER — Ambulatory Visit (INDEPENDENT_AMBULATORY_CARE_PROVIDER_SITE_OTHER): Payer: BC Managed Care – PPO | Admitting: Cardiology

## 2014-06-18 ENCOUNTER — Encounter: Payer: Self-pay | Admitting: Cardiology

## 2014-06-18 VITALS — BP 140/80 | HR 75 | Ht 65.0 in | Wt 199.0 lb

## 2014-06-18 DIAGNOSIS — I4891 Unspecified atrial fibrillation: Secondary | ICD-10-CM

## 2014-06-18 DIAGNOSIS — E785 Hyperlipidemia, unspecified: Secondary | ICD-10-CM

## 2014-06-18 DIAGNOSIS — I480 Paroxysmal atrial fibrillation: Secondary | ICD-10-CM

## 2014-06-18 MED ORDER — DILTIAZEM HCL ER COATED BEADS 180 MG PO CP24: 180.0000 mg | ORAL_CAPSULE | Freq: Every day | ORAL | Status: DC

## 2014-06-18 MED ORDER — METOPROLOL SUCCINATE ER 50 MG PO TB24: ORAL_TABLET | ORAL | Status: DC

## 2014-06-18 NOTE — Assessment & Plan Note (Signed)
Change in blood pressure medications as outlined in her atrial fibrillation.

## 2014-06-18 NOTE — Patient Instructions (Signed)
Your physician wants you to follow-up in: [place] will receive a reminder letter in the mail two months in advance. If you don't receive a letter, please call our office to schedule the follow-up appointment.   DECREASE METOPROLOL TO 75 MG AT BEDTIME  START DILTIAZEM CD 180 MG ONCE DAILY IN THE MORNING

## 2014-06-18 NOTE — Progress Notes (Signed)
HPI: FU PAF. Nuclear study in February of 2010 showed an ejection fraction of 75% and normal perfusion. Last echo in April 2015 showed normal LV function, grade 1 diastolic dysfunction, moderate LAE, mild MR and trace TR. Monitor in June 2015 showed sinus rhythm. At last OV beta blocker increased. Since I last saw her, She has some dyspnea on exertion but no chest pain. She has had one episode of atrial fibrillation. She notes increased fatigue with higher dose beta blocker.   Current Outpatient Prescriptions  Medication Sig Dispense Refill  . apixaban (ELIQUIS) 5 MG TABS tablet Take 1 tablet (5 mg total) by mouth 2 (two) times daily.  60 tablet  6  . Cholecalciferol (VITAMIN D PO) Take 5,000 Units by mouth daily.      . CRESTOR 10 MG tablet TAKE 1 TABLET BY MOUTH EVERY DAY  30 tablet  0  . diazepam (VALIUM) 10 MG tablet Take 1 tablet (10 mg total) by mouth at bedtime as needed for anxiety.  30 tablet  1  . furosemide (LASIX) 20 MG tablet Take 1 tablet (20 mg total) by mouth daily.  30 tablet  0  . metoprolol succinate (TOPROL-XL) 50 MG 24 hr tablet Take 75 mg by mouth daily.       Vladimir Faster Glycol-Propyl Glycol (SYSTANE OP) Apply 1 drop to eye daily as needed (for sry eyes).      . promethazine (PHENERGAN) 25 MG tablet Take 1 tablet (25 mg total) by mouth every 8 (eight) hours as needed for nausea or vomiting.  30 tablet  0  . sertraline (ZOLOFT) 50 MG tablet Take 1 tablet (50 mg total) by mouth daily.  30 tablet  3   No current facility-administered medications for this visit.     Past Medical History  Diagnosis Date  . PAF (paroxysmal atrial fibrillation)   . Hyperlipidemia   . HTN (hypertension)   . Aortic valve calcification     Mild per echo in February of 2010  . Obesity   . History of hiatal hernia     with repair  . Diverticulosis   . Chronic headaches   . Depression   . GERD (gastroesophageal reflux disease)     Past Surgical History  Procedure Laterality  Date  . Hernia repair  2008    nissan fundiplication  . Eye surgery      L-yey myopia  . Knee arthroscopy  2005    R-knee  . Elbow surgery  left    Tendon Surgery  . Tubal ligation    . Tonsillectomy    . Colonoscopy    . Upper gastrointestinal endoscopy    . Laparoscopic nissen fundoplication      History   Social History  . Marital Status: Legally Separated    Spouse Name: N/A    Number of Children: N/A  . Years of Education: N/A   Occupational History  . Not on file.   Social History Main Topics  . Smoking status: Former Smoker    Types: Cigarettes    Quit date: 09/06/1977  . Smokeless tobacco: Never Used  . Alcohol Use: No     Comment: once a month  . Drug Use: No  . Sexual Activity: Not on file   Other Topics Concern  . Not on file   Social History Narrative  . No narrative on file    ROS: no fevers or chills, productive cough, hemoptysis, dysphasia, odynophagia, melena, hematochezia,  dysuria, hematuria, rash, seizure activity, orthopnea, PND, pedal edema, claudication. Remaining systems are negative.  Physical Exam: Well-developed obese in no acute distress.  Skin is warm and dry.  HEENT is normal.  Neck is supple.  Chest is clear to auscultation with normal expansion.  Cardiovascular exam is regular rate and rhythm.  Abdominal exam nontender or distended. No masses palpated. Extremities show no edema. neuro grossly intact  ECG Sinus rhythm, right axis deviation.

## 2014-06-18 NOTE — Assessment & Plan Note (Signed)
Patient had improvement in symptoms with higher dose beta blocker. However it has caused increased fatigue. Decrease metoprolol to 75 mg each bedtime. Add Cardizem CD 180 mg by mouth every morning. If she has more frequent episodes in the future I will add antiarrhythmic. Most likely flecainide. Continue apixaban.

## 2014-06-18 NOTE — Assessment & Plan Note (Signed)
Continue statin. 

## 2014-07-11 ENCOUNTER — Other Ambulatory Visit: Payer: Self-pay | Admitting: *Deleted

## 2014-07-11 MED ORDER — ROSUVASTATIN CALCIUM 10 MG PO TABS: ORAL_TABLET | ORAL | Status: DC

## 2014-07-30 ENCOUNTER — Encounter: Payer: Self-pay | Admitting: Cardiology

## 2014-08-18 ENCOUNTER — Other Ambulatory Visit: Payer: Self-pay | Admitting: Internal Medicine

## 2014-08-24 ENCOUNTER — Other Ambulatory Visit: Payer: Self-pay | Admitting: Internal Medicine

## 2014-09-04 ENCOUNTER — Other Ambulatory Visit: Payer: Self-pay | Admitting: Cardiology

## 2014-09-05 ENCOUNTER — Other Ambulatory Visit: Payer: Self-pay | Admitting: *Deleted

## 2014-09-05 DIAGNOSIS — I480 Paroxysmal atrial fibrillation: Secondary | ICD-10-CM

## 2014-09-05 MED ORDER — APIXABAN 5 MG PO TABS: 5.0000 mg | ORAL_TABLET | Freq: Two times a day (BID) | ORAL | Status: DC

## 2014-09-12 ENCOUNTER — Telehealth: Payer: Self-pay

## 2014-09-12 MED ORDER — LEVOFLOXACIN 500 MG PO TABS: 500.0000 mg | ORAL_TABLET | Freq: Every day | ORAL | Status: DC

## 2014-09-12 NOTE — Telephone Encounter (Signed)
Cal in Levaquin 500 mg daily x 7 days

## 2014-09-12 NOTE — Telephone Encounter (Signed)
Patient called requesting something for Ear Infection pt unable to make appt or go to Urgent care due to work schedule. Please Advise

## 2014-10-25 ENCOUNTER — Encounter: Payer: Self-pay | Admitting: Cardiology

## 2015-01-01 ENCOUNTER — Other Ambulatory Visit: Payer: Self-pay | Admitting: Internal Medicine

## 2015-01-01 NOTE — Telephone Encounter (Signed)
Valium refill called into pharmacy. 

## 2015-01-01 NOTE — Telephone Encounter (Signed)
Refill once 

## 2015-01-09 ENCOUNTER — Telehealth: Payer: Self-pay | Admitting: Internal Medicine

## 2015-01-09 NOTE — Telephone Encounter (Signed)
Patient calls with sinus drainage and cough.  Wanted something called in for cough.  And, felt she had sinus infection.  Advised Dr. Renold Genta is out of the office until Monday, 4/18.  Advised to go to Urgent Care.  Patient advised that she has cough syrup left from last year and feels this most likely is allergies.  States she has A-Fib and wanted to know what is best for the cough.  Advised patient to ask her pharmacist when she goes to the drug store what would be safest for her to use with her hypertension and A-Fib issues.  States she will go back to bed and try to wait it out.

## 2015-01-11 ENCOUNTER — Telehealth: Payer: Self-pay | Admitting: Cardiology

## 2015-01-11 NOTE — Telephone Encounter (Signed)
New message      Pt says her PCP is out of town today.  Can Dr Stanford Breed call her in some cough syrup.  She is coughing up phlegm one minute and dry the next week, stopped up ears and no fever.  She do not know what to take with AFIB.  She is not "comfortable" going to an urgent care----"they do not know her".  Pt has been out of work 3 days with this cough.  She thought it was allergies.

## 2015-01-11 NOTE — Telephone Encounter (Signed)
Erasmo Downer, dr Stanford Breed has given the okay for cough syrup. She is allergic to codeine, can you help me please

## 2015-01-11 NOTE — Telephone Encounter (Signed)
Spoke with pt, aware to get delsym OTC. There is no color to the sputum.

## 2015-01-11 NOTE — Telephone Encounter (Signed)
Would recommend starting with Delsym q12 hours.  Also have her drink a lot of water and avoid dairy for a few days.  If Delsym won't stop it you can try Tussionex (prescription with hydrocodone - check with her if she's okay with hydrocodone), 1 tsp q12h. This will make her very drowsy.

## 2015-01-14 ENCOUNTER — Telehealth: Payer: Self-pay | Admitting: Internal Medicine

## 2015-01-14 MED ORDER — BENZONATATE 200 MG PO CAPS: 200.0000 mg | ORAL_CAPSULE | Freq: Three times a day (TID) | ORAL | Status: DC | PRN

## 2015-01-14 MED ORDER — LEVOFLOXACIN 500 MG PO TABS: 500.0000 mg | ORAL_TABLET | Freq: Every day | ORAL | Status: DC

## 2015-01-14 NOTE — Telephone Encounter (Signed)
Started last Sunday w/symptoms of URI.  Since you were out of the office, she contacted her Cardiologist.  He told her to take Delsym.  She took an entire bottle.  She has had no fever at all.  States she cannot come in today.  Says she is feeling too awful to get out of bed.  Head is hurting (not migraine), abdomen is sore from coughing, it has all settled in her chest.  Still no fever.  She is begging for you to call her something in.  States she'll come in for a well check afterwards.  Says she went in to work Saturday long enough to get her taxes done and hasn't been out since.  I explained that you do not normally call in medication without seeing the patient's.  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    Pharmacy:  [facility] @ [place]

## 2015-01-14 NOTE — Telephone Encounter (Signed)
Patient notified

## 2015-01-14 NOTE — Telephone Encounter (Signed)
Call in Levaquin 500 mg daily x 10 days and Tessalon perles 200 mg tid #30. Remind pt that narcotic med cannot be called in for cough. Can print script up if she has someone to pick up for her.

## 2015-02-18 NOTE — Progress Notes (Signed)
HPI: FU PAF. Nuclear study in February of 2010 showed an ejection fraction of 75% and normal perfusion. Last echo in April 2015 showed normal LV function, grade 1 diastolic dysfunction, moderate LAE, mild MR and trace TR. Monitor in June 2015 showed sinus rhythm. Since I last saw her,   Current Outpatient Prescriptions  Medication Sig Dispense Refill  . apixaban (ELIQUIS) 5 MG TABS tablet Take 1 tablet (5 mg total) by mouth 2 (two) times daily. 60 tablet 5  . benzonatate (TESSALON) 200 MG capsule Take 1 capsule (200 mg total) by mouth 3 (three) times daily as needed for cough. 30 capsule 0  . Cholecalciferol (VITAMIN D PO) Take 5,000 Units by mouth daily.    . diazepam (VALIUM) 10 MG tablet TAKE ONE TABLET AT BEDTIME FOR SLEEP` 30 tablet 0  . diltiazem (CARDIZEM CD) 180 MG 24 hr capsule Take 1 capsule (180 mg total) by mouth daily after breakfast. 90 capsule 3  . ELIQUIS 5 MG TABS tablet TAKE 1 TABLET BY MOUTH TWICE DAILY 60 tablet 5  . furosemide (LASIX) 20 MG tablet Take 1 tablet (20 mg total) by mouth daily. 30 tablet 0  . levofloxacin (LEVAQUIN) 500 MG tablet Take 1 tablet (500 mg total) by mouth daily. 7 tablet 0  . levofloxacin (LEVAQUIN) 500 MG tablet Take 1 tablet (500 mg total) by mouth daily. 10 tablet 0  . metoprolol succinate (TOPROL-XL) 50 MG 24 hr tablet TAKE ONE AND ONE HALF TABLETS AT BEDTIME    . Polyethyl Glycol-Propyl Glycol (SYSTANE OP) Apply 1 drop to eye daily as needed (for sry eyes).    . promethazine (PHENERGAN) 25 MG tablet Take 1 tablet (25 mg total) by mouth every 8 (eight) hours as needed for nausea or vomiting. 30 tablet 0  . rosuvastatin (CRESTOR) 10 MG tablet TAKE 1 TABLET BY MOUTH EVERY DAY 30 tablet 11  . sertraline (ZOLOFT) 50 MG tablet TAKE 1 TABLET BY MOUTH DAILY 90 tablet 3   No current facility-administered medications for this visit.     Past Medical History  Diagnosis Date  . PAF (paroxysmal atrial fibrillation)   . Hyperlipidemia   . HTN  (hypertension)   . Aortic valve calcification     Mild per echo in February of 2010  . Obesity   . History of hiatal hernia     with repair  . Diverticulosis   . Chronic headaches   . Depression   . GERD (gastroesophageal reflux disease)     Past Surgical History  Procedure Laterality Date  . Hernia repair  2008    nissan fundiplication  . Eye surgery      L-yey myopia  . Knee arthroscopy  2005    R-knee  . Elbow surgery  left    Tendon Surgery  . Tubal ligation    . Tonsillectomy    . Colonoscopy    . Upper gastrointestinal endoscopy    . Laparoscopic nissen fundoplication      History   Social History  . Marital Status: Legally Separated    Spouse Name: N/A  . Number of Children: N/A  . Years of Education: N/A   Occupational History  . Not on file.   Social History Main Topics  . Smoking status: Former Smoker    Types: Cigarettes    Quit date: 09/06/1977  . Smokeless tobacco: Never Used  . Alcohol Use: No     Comment: once a month  . Drug  Use: No  . Sexual Activity: Not on file   Other Topics Concern  . Not on file   Social History Narrative    ROS: no fevers or chills, productive cough, hemoptysis, dysphasia, odynophagia, melena, hematochezia, dysuria, hematuria, rash, seizure activity, orthopnea, PND, pedal edema, claudication. Remaining systems are negative.  Physical Exam: Well-developed well-nourished in no acute distress.  Skin is warm and dry.  HEENT is normal.  Neck is supple.  Chest is clear to auscultation with normal expansion.  Cardiovascular exam is regular rate and rhythm.  Abdominal exam nontender or distended. No masses palpated. Extremities show no edema. neuro grossly intact  ECG     This encounter was created in error - please disregard.

## 2015-02-21 ENCOUNTER — Telehealth: Payer: Self-pay | Admitting: Cardiology

## 2015-02-21 NOTE — Telephone Encounter (Signed)
I have spoke to this pt. At lenght

## 2015-02-21 NOTE — Telephone Encounter (Signed)
New Message   Pt wanted to know if she will need to fast in preparation for the labs she is having tomorrow. Please call back and discuss.

## 2015-02-21 NOTE — Telephone Encounter (Signed)
Dr. Wilber Oliphant has been informed it is ok to to hold effient and asa for procedure

## 2015-02-22 ENCOUNTER — Encounter: Payer: Self-pay | Admitting: Cardiology

## 2015-02-25 ENCOUNTER — Other Ambulatory Visit: Payer: Self-pay | Admitting: Internal Medicine

## 2015-02-25 NOTE — Telephone Encounter (Signed)
Refill once 

## 2015-02-26 ENCOUNTER — Other Ambulatory Visit: Payer: Self-pay | Admitting: *Deleted

## 2015-02-26 MED ORDER — DIAZEPAM 10 MG PO TABS: ORAL_TABLET | ORAL | Status: DC

## 2015-03-04 ENCOUNTER — Other Ambulatory Visit: Payer: Self-pay | Admitting: Cardiology

## 2015-03-04 NOTE — Telephone Encounter (Signed)
Rx has been sent to the pharmacy electronically. ° °

## 2015-03-18 ENCOUNTER — Ambulatory Visit (INDEPENDENT_AMBULATORY_CARE_PROVIDER_SITE_OTHER): Payer: BLUE CROSS/BLUE SHIELD | Admitting: Internal Medicine

## 2015-03-18 ENCOUNTER — Encounter: Payer: Self-pay | Admitting: Internal Medicine

## 2015-03-18 VITALS — BP 112/86 | HR 70 | Temp 98.9°F | Wt 196.0 lb

## 2015-03-18 DIAGNOSIS — R8290 Unspecified abnormal findings in urine: Secondary | ICD-10-CM | POA: Diagnosis not present

## 2015-03-18 DIAGNOSIS — N3091 Cystitis, unspecified with hematuria: Secondary | ICD-10-CM | POA: Diagnosis not present

## 2015-03-18 DIAGNOSIS — N390 Urinary tract infection, site not specified: Secondary | ICD-10-CM | POA: Diagnosis not present

## 2015-03-18 DIAGNOSIS — R319 Hematuria, unspecified: Secondary | ICD-10-CM

## 2015-03-18 LAB — POCT URINALYSIS DIPSTICK
Glucose, UA: NEGATIVE
LEUKOCYTES UA: NEGATIVE
Nitrite, UA: NEGATIVE
Urobilinogen, UA: NEGATIVE
pH, UA: 6

## 2015-03-18 LAB — CBC WITH DIFFERENTIAL/PLATELET
BASOS ABS: 0 10*3/uL (ref 0.0–0.1)
BASOS PCT: 0 % (ref 0–1)
Eosinophils Absolute: 0.4 10*3/uL (ref 0.0–0.7)
Eosinophils Relative: 5 % (ref 0–5)
HEMATOCRIT: 39.4 % (ref 36.0–46.0)
Hemoglobin: 12.9 g/dL (ref 12.0–15.0)
Lymphocytes Relative: 25 % (ref 12–46)
Lymphs Abs: 1.9 10*3/uL (ref 0.7–4.0)
MCH: 30.1 pg (ref 26.0–34.0)
MCHC: 32.7 g/dL (ref 30.0–36.0)
MCV: 91.8 fL (ref 78.0–100.0)
MONO ABS: 0.7 10*3/uL (ref 0.1–1.0)
MPV: 9.9 fL (ref 8.6–12.4)
Monocytes Relative: 9 % (ref 3–12)
NEUTROS ABS: 4.5 10*3/uL (ref 1.7–7.7)
Neutrophils Relative %: 61 % (ref 43–77)
PLATELETS: 291 10*3/uL (ref 150–400)
RBC: 4.29 MIL/uL (ref 3.87–5.11)
RDW: 12.6 % (ref 11.5–15.5)
WBC: 7.4 10*3/uL (ref 4.0–10.5)

## 2015-03-18 MED ORDER — CEFTRIAXONE SODIUM 1 G IJ SOLR
1.0000 g | Freq: Once | INTRAMUSCULAR | Status: AC
  Administered 2015-03-18: 1 g via INTRAMUSCULAR

## 2015-03-18 MED ORDER — CIPROFLOXACIN HCL 500 MG PO TABS: 500.0000 mg | ORAL_TABLET | Freq: Two times a day (BID) | ORAL | Status: DC

## 2015-03-18 NOTE — Patient Instructions (Signed)
Take Levaquin 500 mg daily for 10 days. Culture is pending.

## 2015-03-18 NOTE — Progress Notes (Signed)
   Subjective:    Patient ID: Olivia Reynolds, female    DOB: 02/22/1951, 64 y.o.   MRN: [id number]  HPI Onset Friday of back pain, malaise and fatigue. Subsequently developed hematuria yesterday which frightened her.. She is on Eloquis. Has had chills. Not sure if she had fever because thermometer was not working. Had some nausea. Did not feel like eating. Has had some headache. Back pain developed into lower abdominal pain bilaterally. Has urge to urinate but doesn't urinate very much. No dysuria. Has never had UTI. For several weeks has had some issues with diarrhea.    Review of Systems     Objective:   Physical Exam  CVA tenderness is present. Lower abdominal tenderness without rebound. Urinalysis abnormal. Culture sent.      Assessment & Plan:  Probable hemorrhagic cystitis  Plan: CBC shows normal white blood cell count. Given Rocephin 1 g IM. Cipro 500 mg twice daily for 10 days. Go home, rest, drink plenty of fluids. Patient to call with progress report tomorrow

## 2015-03-19 ENCOUNTER — Telehealth: Payer: Self-pay | Admitting: Internal Medicine

## 2015-03-19 DIAGNOSIS — R109 Unspecified abdominal pain: Secondary | ICD-10-CM

## 2015-03-19 NOTE — Telephone Encounter (Signed)
Patient having difficulty passing urine. Will get ultrasound of the kidneys. Has not picked up Cipro prescription yet.

## 2015-03-19 NOTE — Telephone Encounter (Signed)
Pt calls back. Has urinated a great deal and wants to cancel ultrasound

## 2015-04-14 ENCOUNTER — Other Ambulatory Visit: Payer: Self-pay | Admitting: Internal Medicine

## 2015-04-14 ENCOUNTER — Other Ambulatory Visit: Payer: Self-pay | Admitting: Cardiology

## 2015-05-08 ENCOUNTER — Telehealth: Payer: Self-pay | Admitting: Internal Medicine

## 2015-05-08 MED ORDER — BUTALBITAL-APAP-CAFFEINE 50-325-40 MG PO TABS: 1.0000 | ORAL_TABLET | Freq: Four times a day (QID) | ORAL | Status: DC | PRN

## 2015-05-08 NOTE — Telephone Encounter (Signed)
Fioricet script sent to pharmacy

## 2015-05-08 NOTE — Telephone Encounter (Signed)
Patient states she has had a migraine x 2 days.  Changed pharmacy about 3 years ago and therefore [facility] will not refill her Fioricet.  She stopped taking all meds for migraines 3 years ago.  She started with the headache 2 days ago.  This a.m. States that she got up and thought it was better; however, when she started moving around, it started back.  She is in a dark room, has applied ice packs and is having no relief.   Spoke with Dr. Renold Genta, she is fine with refilling patient's Rx for Fioricet #30; take 1-2 tabs every 4-6 hours prn headache; not to exceed 6 tabs in 24 hours.  No refills.  Called in Rx to [facility] @ [address].  Gave to [place] to put in Rx info to chart info on patient.    Advised patient that Rx has been called in and she can have it picked up.

## 2015-05-17 NOTE — Progress Notes (Signed)
HPI: FU PAF. Nuclear study in February of 2010 showed an ejection fraction of 75% and normal perfusion. Last echo in April 2015 showed normal LV function, grade 1 diastolic dysfunction, moderate LAE, mild MR and trace TR. Monitor in June 2015 showed sinus rhythm. Since I last saw her, there is some dyspnea on exertion. No orthopnea or PND. Mild pedal edema. No chest pain. Occasional brief flutters lasting 10 seconds or less. No syncope. She has had a recent nosebleed and hematuria felt secondary to UTI.  Current Outpatient Prescriptions  Medication Sig Dispense Refill  . butalbital-acetaminophen-caffeine (FIORICET) 50-325-40 MG per tablet Take 1-2 tablets by mouth every 6 (six) hours as needed for headache. Not to exceed 6 tablets in 24 hours 30 tablet 0  . Cholecalciferol (VITAMIN D PO) Take 5,000 Units by mouth daily.    . ciprofloxacin (CIPRO) 500 MG tablet Take 1 tablet (500 mg total) by mouth 2 (two) times daily. 20 tablet 0  . diazepam (VALIUM) 10 MG tablet TAKE 1 TABLET BY MOUTH EVERY NIGHT AT BEDTIME FOR SLEEP 30 tablet 0  . diltiazem (CARDIZEM CD) 180 MG 24 hr capsule Take 1 capsule (180 mg total) by mouth daily after breakfast. 90 capsule 3  . ELIQUIS 5 MG TABS tablet TAKE 1 TABLET BY MOUTH TWICE DAILY 60 tablet 5  . metoprolol succinate (TOPROL-XL) 50 MG 24 hr tablet TAKE ONE AND ONE HALF TABLETS AT BEDTIME    . Polyethyl Glycol-Propyl Glycol (SYSTANE OP) Apply 1 drop to eye daily as needed (for sry eyes).    . rosuvastatin (CRESTOR) 10 MG tablet TAKE 1 TABLET BY MOUTH EVERY DAY 30 tablet 6  . sertraline (ZOLOFT) 50 MG tablet TAKE 1 TABLET BY MOUTH DAILY 90 tablet 3   No current facility-administered medications for this visit.     Past Medical History  Diagnosis Date  . PAF (paroxysmal atrial fibrillation)   . Hyperlipidemia   . HTN (hypertension)   . Aortic valve calcification     Mild per echo in February of 2010  . Obesity   . History of hiatal hernia     with  repair  . Diverticulosis   . Chronic headaches   . Depression   . GERD (gastroesophageal reflux disease)     Past Surgical History  Procedure Laterality Date  . Hernia repair  2008    nissan fundiplication  . Eye surgery      L-yey myopia  . Knee arthroscopy  2005    R-knee  . Elbow surgery  left    Tendon Surgery  . Tubal ligation    . Tonsillectomy    . Colonoscopy    . Upper gastrointestinal endoscopy    . Laparoscopic nissen fundoplication      Social History   Social History  . Marital Status: Legally Separated    Spouse Name: N/A  . Number of Children: N/A  . Years of Education: N/A   Occupational History  . Not on file.   Social History Main Topics  . Smoking status: Former Smoker    Types: Cigarettes    Quit date: 09/06/1977  . Smokeless tobacco: Never Used  . Alcohol Use: No     Comment: once a month  . Drug Use: No  . Sexual Activity: Not on file   Other Topics Concern  . Not on file   Social History Narrative    ROS: no fevers or chills, productive cough, hemoptysis, dysphasia, odynophagia, melena,  hematochezia, dysuria, hematuria, rash, seizure activity, orthopnea, PND, pedal edema, claudication. Remaining systems are negative.  Physical Exam: Well-developed obese in no acute distress.  Skin is warm and dry.  HEENT is normal.  Neck is supple.  Chest is clear to auscultation with normal expansion.  Cardiovascular exam is regular rate and rhythm.  Abdominal exam nontender or distended. No masses palpated. Extremities show no edema. neuro grossly intact  ECG sinus rhythm at a rate of 79. No ST changes.

## 2015-05-22 ENCOUNTER — Encounter: Payer: Self-pay | Admitting: Cardiology

## 2015-05-22 ENCOUNTER — Ambulatory Visit (INDEPENDENT_AMBULATORY_CARE_PROVIDER_SITE_OTHER): Payer: BLUE CROSS/BLUE SHIELD | Admitting: Cardiology

## 2015-05-22 VITALS — BP 122/78 | HR 79 | Ht 64.5 in | Wt 198.6 lb

## 2015-05-22 DIAGNOSIS — I10 Essential (primary) hypertension: Secondary | ICD-10-CM | POA: Diagnosis not present

## 2015-05-22 DIAGNOSIS — I480 Paroxysmal atrial fibrillation: Secondary | ICD-10-CM

## 2015-05-22 DIAGNOSIS — E785 Hyperlipidemia, unspecified: Secondary | ICD-10-CM | POA: Diagnosis not present

## 2015-05-22 MED ORDER — DILTIAZEM HCL ER COATED BEADS 360 MG PO CP24: 360.0000 mg | ORAL_CAPSULE | Freq: Every day | ORAL | Status: DC

## 2015-05-22 NOTE — Patient Instructions (Signed)
Your physician wants you to follow-up in: [place] will receive a reminder letter in the mail two months in advance. If you don't receive a letter, please call our office to schedule the follow-up appointment.   TAKE METOPROLOL 50 MG ONCE DAILY X 2 DAYS THEN DECREASE TO 25 MG ONCE DAILY X 2 DAYS THEN STOP  THE DAY AFTER STOPPING METOPROLOL INCREASE DILTIAZEM TO 360 MG ONCE DAILY  Your physician recommends that you HAVE LAB WORK TODAY

## 2015-05-22 NOTE — Assessment & Plan Note (Signed)
Continue statin. 

## 2015-05-22 NOTE — Assessment & Plan Note (Signed)
Continue present blood pressure medications. 

## 2015-05-22 NOTE — Assessment & Plan Note (Signed)
She is in sinus rhythm today. Beta-blockade has caused some fatigue. I will wean Toprol to off. Decrease to 50 mg for 2 days and then 25 mg for 2 days and then discontinue. Increase Cardizem from 240 mg daily to 360 mg daily. Continue apixaban. She has had a recent nosebleed. I would like for her to continue anticoagulation for now to decrease the risk of CVA. We will reconsider this in the future if she has more bleeding problems.

## 2015-05-23 LAB — BASIC METABOLIC PANEL
BUN: 15 mg/dL (ref 7–25)
CALCIUM: 9.2 mg/dL (ref 8.6–10.4)
CO2: 28 mmol/L (ref 20–31)
Chloride: 104 mmol/L (ref 98–110)
Creat: 0.73 mg/dL (ref 0.50–0.99)
GLUCOSE: 96 mg/dL (ref 65–99)
Potassium: 4.3 mmol/L (ref 3.5–5.3)
Sodium: 145 mmol/L (ref 135–146)

## 2015-05-23 LAB — CBC
HCT: 39.3 % (ref 36.0–46.0)
HEMOGLOBIN: 13.2 g/dL (ref 12.0–15.0)
MCH: 29.9 pg (ref 26.0–34.0)
MCHC: 33.6 g/dL (ref 30.0–36.0)
MCV: 89.1 fL (ref 78.0–100.0)
MPV: 9.2 fL (ref 8.6–12.4)
Platelets: 346 10*3/uL (ref 150–400)
RBC: 4.41 MIL/uL (ref 3.87–5.11)
RDW: 13.4 % (ref 11.5–15.5)
WBC: 8.6 10*3/uL (ref 4.0–10.5)

## 2015-05-29 ENCOUNTER — Telehealth: Payer: Self-pay | Admitting: Cardiology

## 2015-05-29 DIAGNOSIS — I480 Paroxysmal atrial fibrillation: Secondary | ICD-10-CM

## 2015-05-29 MED ORDER — DILTIAZEM HCL ER COATED BEADS 180 MG PO CP24: 180.0000 mg | ORAL_CAPSULE | Freq: Every day | ORAL | Status: DC

## 2015-05-29 MED ORDER — METOPROLOL SUCCINATE ER 50 MG PO TB24: 50.0000 mg | ORAL_TABLET | Freq: Every day | ORAL | Status: DC

## 2015-05-29 NOTE — Telephone Encounter (Signed)
Change cardizem to 180 daily and resume toprol 50 daily. Kirk Ruths

## 2015-05-29 NOTE — Telephone Encounter (Signed)
Patient states her blood pressure medication has been changed and she is not feeling right.  Please call.

## 2015-05-29 NOTE — Telephone Encounter (Signed)
Spoke with pt, Aware of dr Jacalyn Lefevre recommendations.  New script called into the pharmacy.

## 2015-05-29 NOTE — Telephone Encounter (Signed)
Spoke with pt, she has been on the diltiazem 360 mg for 3 days now. She has had 3 to 4 episodes of break through atrial fib lasting 10 min or less. She is more concerned about her breathing heavy and having to take extra deep breath when walking from the car to the elevator at work. She reports feeling "foggy" or not as mentally alert and no energy She denies any edema. Not sure what her bp is running, cuff is broken. Will forward for dr Stanford Breed review

## 2015-06-20 ENCOUNTER — Encounter: Payer: Self-pay | Admitting: Internal Medicine

## 2015-07-02 ENCOUNTER — Telehealth: Payer: Self-pay | Admitting: Internal Medicine

## 2015-07-02 NOTE — Telephone Encounter (Signed)
In bed 2 days with back and muscle spasms.  Had been watching her 64 year old granddaughter and she jumped up in her arms to say good-bye and she felt her back "catch".  She has been in bed since.  She cannot roll over without it causing her pain.  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  She wants to know if you can "work a miracle" and provide something for her that will relieve this pain and spasms.    Pharmacy:  [facility] @ [address]  Contact:  [phone]

## 2015-07-03 ENCOUNTER — Other Ambulatory Visit: Payer: Self-pay | Admitting: Internal Medicine

## 2015-07-03 NOTE — Telephone Encounter (Signed)
Spoke with patient on Tuesday evening about 5:45 p.m. and offered to bring her into the office on Wednesday morning for appointment.  Patient advised she had a meeting with a physician this a.m. related to business that she couldn't cancel.  Stated she would touch base with me following that meeting to see if Dr. Renold Genta is still in the office to see if we can work her in following her meeting.  Otherwise, patient has been given an appointment for Friday, 10/7 @ 12:15 to see Dr. Renold Genta.

## 2015-07-03 NOTE — Telephone Encounter (Signed)
Offered appt Wednesday morning after our staff meeting.  Patient indicates she has a previously scheduled meeting Wed am.  I will be here until 1 pm and can work her in.

## 2015-07-05 ENCOUNTER — Ambulatory Visit: Payer: BLUE CROSS/BLUE SHIELD | Admitting: Internal Medicine

## 2015-07-17 ENCOUNTER — Other Ambulatory Visit: Payer: Self-pay | Admitting: Internal Medicine

## 2015-07-18 NOTE — Telephone Encounter (Signed)
LM on refill line @ [facility] [phone] per order from Dr. Renold Genta to refill Diazepam 10mg .  #30, no refill.

## 2015-07-18 NOTE — Telephone Encounter (Signed)
Refill once #30

## 2015-07-21 ENCOUNTER — Other Ambulatory Visit: Payer: Self-pay | Admitting: Internal Medicine

## 2015-09-04 ENCOUNTER — Telehealth: Payer: Self-pay

## 2015-09-04 NOTE — Telephone Encounter (Signed)
Per dr. baxley-cannot call in prescription strength cough syrup without being seen in office.

## 2015-09-04 NOTE — Telephone Encounter (Signed)
Patient states that she is coughing and is very congested x 3 days. She states that she has been taking corcidin and is asking for a cough syrup. I advised her you were out of office today and offered her an appt for tomorrow but she declined. Please advise.

## 2015-09-05 ENCOUNTER — Ambulatory Visit: Payer: Self-pay | Admitting: Internal Medicine

## 2015-09-06 ENCOUNTER — Encounter: Payer: Self-pay | Admitting: Internal Medicine

## 2015-09-06 ENCOUNTER — Ambulatory Visit (INDEPENDENT_AMBULATORY_CARE_PROVIDER_SITE_OTHER): Payer: BLUE CROSS/BLUE SHIELD | Admitting: Internal Medicine

## 2015-09-06 ENCOUNTER — Other Ambulatory Visit: Payer: Self-pay | Admitting: Internal Medicine

## 2015-09-06 VITALS — BP 110/78 | HR 100 | Temp 97.9°F | Wt 195.0 lb

## 2015-09-06 DIAGNOSIS — R1310 Dysphagia, unspecified: Secondary | ICD-10-CM | POA: Diagnosis not present

## 2015-09-06 DIAGNOSIS — J209 Acute bronchitis, unspecified: Secondary | ICD-10-CM | POA: Diagnosis not present

## 2015-09-06 DIAGNOSIS — J9801 Acute bronchospasm: Secondary | ICD-10-CM | POA: Diagnosis not present

## 2015-09-06 MED ORDER — HYDROCODONE-HOMATROPINE 5-1.5 MG/5ML PO SYRP: 5.0000 mL | ORAL_SOLUTION | Freq: Three times a day (TID) | ORAL | Status: DC | PRN

## 2015-09-06 MED ORDER — PREDNISONE 10 MG PO TABS: ORAL_TABLET | ORAL | Status: DC

## 2015-09-06 MED ORDER — LEVOFLOXACIN 500 MG PO TABS: 500.0000 mg | ORAL_TABLET | Freq: Every day | ORAL | Status: DC

## 2015-09-06 MED ORDER — ALBUTEROL SULFATE HFA 108 (90 BASE) MCG/ACT IN AERS: 2.0000 | INHALATION_SPRAY | Freq: Four times a day (QID) | RESPIRATORY_TRACT | Status: DC | PRN

## 2015-09-06 NOTE — Telephone Encounter (Signed)
Phoned to pharmacy 

## 2015-09-06 NOTE — Telephone Encounter (Signed)
Refill once 

## 2015-09-06 NOTE — Patient Instructions (Signed)
Uses inhaler 2 sprays 4 times daily for 7-10 days. Take Levaquin 500 milligrams daily for 10 days. Sterapred DS 10 mg 6 day dosepak. Hycodan as needed for cough. Rest and drink plenty of fluids.

## 2015-09-06 NOTE — Progress Notes (Signed)
   Subjective:    Patient ID: Olivia Reynolds, female    DOB: January 23, 1951, 64 y.o.   MRN: [id number]  HPI  Onset Saturday URI symptoms with cough and wheezing. No fever or chills. Discolored sputum. Has deep congested cough. Has malaise and fatigue. Having some issue with food sticking when she swallows. Has appointment to see GI.    Review of Systems     Objective:   Physical Exam  Skin warm and dry. Nodes none. Looks fatigued. Pharynx very slightly injected. TMs slightly full. Neck supple without adenopathy. Chest clear to auscultation but coughing continuously in the office.      Assessment & Plan:  Acute bronchitis  Dysphagia  Plan: Sterapred DS 10 mg 6 day dosepak. Levaquin 500 milligrams daily for 10 days. Hycodan 1 teaspoon by mouth every 8 hours when necessary cough. Albuterol inhaler 2 sprays by mouth 4 times daily. Rest and drink plenty of fluids.

## 2015-09-06 NOTE — Telephone Encounter (Signed)
Called to pharmacy 

## 2015-09-16 ENCOUNTER — Telehealth: Payer: Self-pay | Admitting: Internal Medicine

## 2015-09-16 MED ORDER — PROMETHAZINE HCL 25 MG PO TABS: 25.0000 mg | ORAL_TABLET | Freq: Three times a day (TID) | ORAL | Status: DC | PRN

## 2015-09-16 NOTE — Telephone Encounter (Signed)
States she's still quite congested.  Has some fever at night.  Has maybe one night left of cough syrup.  Doesn't really want any more cough syrup..feels it may be causing her to wake up with headache.  She has finished her antibiotic.  She's having productive cough and is still quite congested, but the sputum is not colored.  Her nose is running non-stop.  She has NO energy.  She is not working today.  She is having some dizziness at times.  Just feels zapped.  Says she's extremely nauseated.  She has some Phenergan from about 2 years ago, but was afraid to take it since it was so old.  She is curious if the drainage is causing the nausea?    Please advise.

## 2015-09-16 NOTE — Telephone Encounter (Signed)
Patient notified

## 2015-09-16 NOTE — Telephone Encounter (Signed)
Call in Phenergan 25 mg tablets #30. Can see this week

## 2015-09-18 ENCOUNTER — Telehealth: Payer: Self-pay | Admitting: Internal Medicine

## 2015-09-18 MED ORDER — HYDROCODONE-HOMATROPINE 5-1.5 MG/5ML PO SYRP: 5.0000 mL | ORAL_SOLUTION | Freq: Three times a day (TID) | ORAL | Status: DC | PRN

## 2015-09-18 MED ORDER — LEVOFLOXACIN 500 MG PO TABS: 500.0000 mg | ORAL_TABLET | Freq: Every day | ORAL | Status: DC

## 2015-09-18 NOTE — Telephone Encounter (Signed)
States that she has run out of cough syrup.  States that she did misread it when she first got it.  She thought it was every 6 hours and she called the pharmacy and found out it was every 8 hours.  So, she did run out a little sooner.  She is STILL coughing like crazy.  States she feels better, but just cannot stop coughing.  No fever.  The phenergan was great and took care of the nausea.    States that her right ear is hurting some, but she feels that it's from the coughing.  She is back at work.  States she hasn't been out in the crowds shopping.  She's just working at her desk.  States the phlegm is not colored anymore.  The cough is still productive, but is clear.    Also wants to know if she's contagious at all.  States her family is ready to cancel Christmas on her because they're afraid of getting sick.  She sounds like she still has a lot of congestion when she coughs.    Please advise.

## 2015-09-18 NOTE — Telephone Encounter (Signed)
Per Dr Renold Genta refill cough syrup once and her antibiotic.

## 2015-09-18 NOTE — Telephone Encounter (Signed)
Called patient to advise that we have Rx for Hycodan ready for pick up.  Refill on antibiotic also called in for her to repeat.  Advised Dr. Renold Genta suggested that a chest x-ray be performed due to lingering chest congestion.  Offered to see patient tomorrow, 12/22 in the office.  Patient advised that she is so busy and has many deadlines due tomorrow, 12/22.  She appreciates the refill on antibiotic and the cough syrup.  She will do this and if she still has all of this congestion after Christmas, she WILL do the chest x-ray to rule out anything further.  She has NOT been having any fevers, just the wicked cough.    She will come by tomorrow a.m. And pick up the cough syrup Rx.  Advised of our hours of operation.

## 2015-09-25 ENCOUNTER — Ambulatory Visit: Payer: BLUE CROSS/BLUE SHIELD | Admitting: Internal Medicine

## 2015-09-26 ENCOUNTER — Ambulatory Visit: Payer: BLUE CROSS/BLUE SHIELD | Admitting: Internal Medicine

## 2015-09-26 ENCOUNTER — Ambulatory Visit (INDEPENDENT_AMBULATORY_CARE_PROVIDER_SITE_OTHER): Payer: BLUE CROSS/BLUE SHIELD | Admitting: Internal Medicine

## 2015-09-26 ENCOUNTER — Encounter: Payer: Self-pay | Admitting: Internal Medicine

## 2015-09-26 VITALS — BP 130/84 | HR 84 | Temp 98.9°F | Resp 20 | Ht 65.0 in | Wt 196.0 lb

## 2015-09-26 DIAGNOSIS — R05 Cough: Secondary | ICD-10-CM | POA: Diagnosis not present

## 2015-09-26 DIAGNOSIS — J209 Acute bronchitis, unspecified: Secondary | ICD-10-CM

## 2015-09-26 DIAGNOSIS — H6691 Otitis media, unspecified, right ear: Secondary | ICD-10-CM

## 2015-09-26 DIAGNOSIS — R059 Cough, unspecified: Secondary | ICD-10-CM

## 2015-09-26 MED ORDER — ONDANSETRON HCL 4 MG PO TABS: 4.0000 mg | ORAL_TABLET | Freq: Three times a day (TID) | ORAL | Status: DC | PRN

## 2015-09-26 MED ORDER — CLARITHROMYCIN 500 MG PO TABS: 500.0000 mg | ORAL_TABLET | Freq: Two times a day (BID) | ORAL | Status: DC

## 2015-09-26 MED ORDER — CEFTRIAXONE SODIUM 1 G IJ SOLR
1.0000 g | INTRAMUSCULAR | Status: AC
  Administered 2015-09-26: 1 g via INTRAMUSCULAR

## 2015-09-26 MED ORDER — PREDNISONE 10 MG (21) PO TBPK: ORAL_TABLET | ORAL | Status: DC

## 2015-09-26 NOTE — Progress Notes (Signed)
   Subjective:    Patient ID: Olivia Reynolds, female    DOB: Jan 28, 1951, 64 y.o.   MRN: [id number]  HPI Protracted URI symptoms. Has had 2 rounds of Levaquin and can't seem to get better. Coughing a lot in the office. Cough is productive. Has nasal congestion is well. Right ear is hurting. Has malaise and fatigue. Has felt nauseated. Phenergan makes her too drowsy to work.    Review of Systems     Objective:   Physical Exam  Skin warm and dry. Nodes none. Pharynx slightly injected. Right TM is dull and retracted but not red. Left TM slightly full. Chest bronchial breath sounds left lower lobe      Assessment & Plan:  Protracted cough  Acute bronchitis  Right otitis media  Plan: Advair 250/50 sample 1 spray by mouth every 12 hours. Rocephin 1 g IM. Change Levaquin to Biaxin 500 mg twice daily for 10 days. Sterapred DS 10 mg 6 day Dosepak. She has Hycodan to take for cough. Zofran 4 mg tablets 1 by mouth every 8 hours when necessary nausea. Have chest x-ray. Rest and drink plenty of fluids.  Addendum: Chest x-ray shows no evidence of pneumonia

## 2015-09-26 NOTE — Patient Instructions (Addendum)
Have chest x-ray. Rocephin 1 g IM given. Change Levaquin to Biaxin 500 mg twice daily for 10 days. Hycodan as needed for cough. Zofran as needed for nausea. Sample of Advair 250/50 1 spray by mouth every 12 hours. Sterapred DS 10 mg 6 day dosepak.

## 2015-09-27 ENCOUNTER — Ambulatory Visit
Admission: RE | Admit: 2015-09-27 | Discharge: 2015-09-27 | Disposition: A | Discharge status: Home / Self Care | Payer: BLUE CROSS/BLUE SHIELD | Source: Ambulatory Visit | Attending: Internal Medicine | Admitting: Internal Medicine

## 2015-09-27 DIAGNOSIS — R05 Cough: Secondary | ICD-10-CM

## 2015-09-27 DIAGNOSIS — R059 Cough, unspecified: Secondary | ICD-10-CM

## 2015-09-28 ENCOUNTER — Encounter: Payer: Self-pay | Admitting: Internal Medicine

## 2015-10-11 ENCOUNTER — Other Ambulatory Visit: Payer: Self-pay | Admitting: Cardiology

## 2015-10-18 ENCOUNTER — Other Ambulatory Visit: Payer: Self-pay | Admitting: Internal Medicine

## 2015-10-18 NOTE — Telephone Encounter (Signed)
Patient states that she does use it and she did request the refill.

## 2015-10-18 NOTE — Telephone Encounter (Signed)
Does pt need refill or is this an automatic request. Please call her.

## 2015-10-18 NOTE — Telephone Encounter (Signed)
Phoned to pharmacy 

## 2015-10-18 NOTE — Telephone Encounter (Signed)
See below

## 2015-11-21 ENCOUNTER — Other Ambulatory Visit: Payer: Self-pay | Admitting: Cardiology

## 2015-11-21 ENCOUNTER — Other Ambulatory Visit: Payer: Self-pay | Admitting: Internal Medicine

## 2015-11-21 NOTE — Telephone Encounter (Signed)
Rx(s) sent to pharmacy electronically.  

## 2015-12-10 NOTE — Telephone Encounter (Signed)
Patient med was called in.

## 2015-12-10 NOTE — Telephone Encounter (Signed)
LM and this was phoned in.

## 2015-12-12 ENCOUNTER — Other Ambulatory Visit: Payer: Self-pay | Admitting: Internal Medicine

## 2015-12-12 NOTE — Telephone Encounter (Signed)
Refill once 

## 2015-12-12 NOTE — Telephone Encounter (Signed)
Phoned to pharmacy 

## 2015-12-12 NOTE — Telephone Encounter (Signed)
Phoned in per [facility]

## 2016-01-19 ENCOUNTER — Other Ambulatory Visit: Payer: Self-pay | Admitting: Internal Medicine

## 2016-01-19 NOTE — Telephone Encounter (Signed)
Does pt need refill?

## 2016-01-20 ENCOUNTER — Other Ambulatory Visit: Payer: Self-pay | Admitting: Internal Medicine

## 2016-01-20 NOTE — Telephone Encounter (Signed)
Left message for return call.

## 2016-01-20 NOTE — Telephone Encounter (Signed)
Olivia Reynolds called saying she's not taken Butalbital since August of 2016 and is completely out but last Saturday she had a migraine and didn't have medication to take. She'd like a refill on Butalbital. She also mentioned she takes Diazepam almost every night to help her sleep and she needs more of that also. Please give her a phone call if needed.  Pt's ph# [phone] Thank you.

## 2016-01-20 NOTE — Telephone Encounter (Signed)
Call in once

## 2016-01-21 NOTE — Telephone Encounter (Signed)
Phoned to pharmacy 

## 2016-02-18 ENCOUNTER — Telehealth: Payer: Self-pay | Admitting: Cardiology

## 2016-02-18 NOTE — Telephone Encounter (Signed)
Pt requesting samples of Crestor and Eliquis -in doughnut hole-pls call [phone]

## 2016-02-18 NOTE — Telephone Encounter (Signed)
Patient called back and checked to see if we had any samples; I checked and we do not at this current time. Patient informed. Patient states her pharmacy will give a courtesy refill. Patient scared to stop Eliquis and Crestor I told her go and see what they give her and go from there. And to call the office back if she had any more questions about her medication.

## 2016-02-19 NOTE — Telephone Encounter (Signed)
Left message for the pt giving her the telephone number to call for eliquis assistance and also the number for [place] for crestor.

## 2016-03-11 ENCOUNTER — Other Ambulatory Visit: Payer: Self-pay | Admitting: Internal Medicine

## 2016-03-11 DIAGNOSIS — L281 Prurigo nodularis: Secondary | ICD-10-CM | POA: Diagnosis not present

## 2016-03-11 DIAGNOSIS — L821 Other seborrheic keratosis: Secondary | ICD-10-CM | POA: Diagnosis not present

## 2016-03-11 NOTE — Telephone Encounter (Signed)
Does pt need refill? If so, refill x 3 months. How often is she taking?

## 2016-03-13 NOTE — Telephone Encounter (Signed)
Patient contacted and she states that she is taking the diazepam just about every night for sleep. She states that she tries to sleep without it at times but isnt successful. I have phoned #[phone] with 2RF to [facility] on [address]

## 2016-03-19 DIAGNOSIS — M75112 Incomplete rotator cuff tear or rupture of left shoulder, not specified as traumatic: Secondary | ICD-10-CM | POA: Diagnosis not present

## 2016-03-19 DIAGNOSIS — M7581 Other shoulder lesions, right shoulder: Secondary | ICD-10-CM | POA: Diagnosis not present

## 2016-03-24 DIAGNOSIS — M25512 Pain in left shoulder: Secondary | ICD-10-CM | POA: Diagnosis not present

## 2016-04-01 ENCOUNTER — Telehealth: Payer: Self-pay | Admitting: Cardiology

## 2016-04-01 ENCOUNTER — Telehealth: Payer: Self-pay | Admitting: Internal Medicine

## 2016-04-01 NOTE — Telephone Encounter (Signed)
Patient is tentatively scheduled for rotator cuff repair by Dr. Para March on Wednesday, 7/12.  I don't see anything scheduled in EPIC yet.  She needs clearance by Dr. Renold Genta.  She called this morning; advised she'll need CPE/Labs/EKG and was told it would be 05/2016.  Patient was NOT happy with that information.  Patient hasn't had CPE in VERY long time; only seen for acute visits.  Last labs were June, 2016.  Patient advised that she sees Dr. Stanford Breed tomorrow for clearance.  Asked why he can't clear her?  She states that Dr. Para March advised she'll have to be cleared by both Drs. Crenshaw and Baxley.  Advised that Dr. Renold Genta will not be back in the office until Monday and the soonest we would be able to do anything would be next week IF she agreed and IF her schedule permitted her to do anything on Wednesday.  However, she wouldn't be able to advise of her schedule until her return on Monday because she cannot see her schedule until she returns on Monday, 7/10.    Patient was to call Dr. Laureen Abrahams office and ask them to fax a clearance form to our office.  Form was NOT faxed on Wednesday, 7/5.  Patient was also to ask Dr. Stanford Breed if he could do EKG/Labs?  Have not heard back from patient nor have I received a form for clearance.  Spoke with Dr. Renold Genta to make her aware of this information.  She said to just wait on patient to return the call back to [place].  Advised patient that our office would be closed on Thursday and we would be back on Friday.

## 2016-04-01 NOTE — Telephone Encounter (Signed)
Returned call to patient.She wanted to know if we received a surgical clearance from [place] for rt rotary cuff scheduled 04/08/16.Advised I cannot see in chart.Advised to keep appointment with Rosaria Ferries PA 04/02/16 at 11:30 am.for clearance.

## 2016-04-01 NOTE — Telephone Encounter (Signed)
NEw MEssage  Pt requested to speak w/ rN about what is to be expected for tomorrows OV w/ Suanne Marker for surgical clearance. Please call back and discuss.

## 2016-04-02 ENCOUNTER — Encounter: Payer: Self-pay | Admitting: Physician Assistant

## 2016-04-02 ENCOUNTER — Ambulatory Visit (INDEPENDENT_AMBULATORY_CARE_PROVIDER_SITE_OTHER): Payer: Medicare Other | Admitting: Physician Assistant

## 2016-04-02 VITALS — BP 138/82 | HR 73 | Wt 196.6 lb

## 2016-04-02 DIAGNOSIS — I480 Paroxysmal atrial fibrillation: Secondary | ICD-10-CM | POA: Diagnosis not present

## 2016-04-02 DIAGNOSIS — Z01818 Encounter for other preprocedural examination: Secondary | ICD-10-CM | POA: Diagnosis not present

## 2016-04-02 DIAGNOSIS — Z7901 Long term (current) use of anticoagulants: Secondary | ICD-10-CM | POA: Diagnosis not present

## 2016-04-02 NOTE — Progress Notes (Signed)
Cardiology Office Note   Date:  04/02/2016   ID:  Olivia Reynolds, DOB July 01, 1951, MRN [id number]  PCP:  Olivia Showers, MD  Cardiologist:  Dr Olivia Evener, PA-C   Chief Complaint  Patient presents with  . surgical clerarance    History of Present Illness: Olivia Reynolds is a 65 y.o. female with a history of PAF on Eliquis, EF 75% w/ grade 1 dd by echo 2010, HTN, HLD, GERD  She needs a rotator cuff repair and both cardiac and IM clearance requested by Dr Olivia Reynolds.  Olivia Reynolds presents for preoperative evaluation.   [name] this very well in general. She has had problems with her right shoulder for some time. She also has problems with her left knee. She has had injections in her left knee to help maintain the cartilage and is doing okay with that at this time, but will eventually need a knee replacement.  Surgery for her shoulder she was to get done as soon as possible.  She does not think she has had any atrial fibrillation. She occasionally has brief palpitations, but they don't last very long and she can't remember when the last one was. She never has prolonged palpitations like she did when she was first diagnosed. She was going through a divorce at that time and wonders stress contributed to her symptoms.  Since she has been on the Cardizem and the metoprolol as well as the Eliquis, she has been very stable from a cardiac standpoint. Activity is limited by musculoskeletal issues, but she never gets chest pain. She has some chronic dyspnea on exertion has not changed recently.  She does not get lower extremity edema, orthopnea or PND. She does not feel her heart limits her activities in any way. She has not had any bleeding issues or problems with the Eliquis.   Past Medical History  Diagnosis Date  . PAF (paroxysmal atrial fibrillation) ([place])   . Hyperlipidemia   . HTN (hypertension)   . Aortic valve calcification     Mild per echo in February of  2010  . Obesity   . History of hiatal hernia     with repair  . Diverticulosis   . Chronic headaches   . Depression   . GERD (gastroesophageal reflux disease)     Past Surgical History  Procedure Laterality Date  . Hernia repair  2008    nissan fundiplication  . Eye surgery      L-yey myopia  . Knee arthroscopy  2005    R-knee  . Elbow surgery  left    Tendon Surgery  . Tubal ligation    . Tonsillectomy    . Colonoscopy    . Upper gastrointestinal endoscopy    . Laparoscopic nissen fundoplication      Current Outpatient Prescriptions  Medication Sig Dispense Refill  . butalbital-acetaminophen-caffeine (FIORICET, ESGIC) 50-325-40 MG tablet TAKE 1 TO 2 TABLETS BY MOUTH EVERY 4 TO 6 HOURS AS NEEDED FOR HEADACHE( DO NOT EXCEED 6 TABLETS IN 24 HOURS) 30 tablet 0  . Cholecalciferol (VITAMIN D PO) Take 5,000 Units by mouth daily.    . clarithromycin (BIAXIN) 500 MG tablet Take 1 tablet (500 mg total) by mouth 2 (two) times daily. 20 tablet 0  . diazepam (VALIUM) 10 MG tablet TAKE 1 TABLET BY MOUTH EVERY NIGHT AT BEDTIME AS NEEDED FOR SLEEP 30 tablet 0  . diazepam (VALIUM) 10 MG tablet TAKE 1 TABLET BY MOUTH  EVERY NIGHT AT BEDTIME AS NEEDED FOR SLEEP 30 tablet 0  . diazepam (VALIUM) 10 MG tablet TAKE 1 TABLET BY MOUTH AT BEDTIME FOR SLEEP 30 tablet 0  . diazepam (VALIUM) 10 MG tablet TAKE 1 TABLET BY MOUTH AT BEDTIME AS NEEDED FOR SLEEP 30 tablet 2  . diltiazem (CARDIZEM CD) 180 MG 24 hr capsule Take 1 capsule (180 mg total) by mouth daily after breakfast. 30 capsule 12  . ELIQUIS 5 MG TABS tablet TAKE 1 TABLET BY MOUTH TWICE DAILY 60 tablet 6  . HYDROcodone-homatropine (HYCODAN) 5-1.5 MG/5ML syrup Take 5 mLs by mouth every 8 (eight) hours as needed for cough. 120 mL 0  . metoprolol succinate (TOPROL-XL) 50 MG 24 hr tablet Take 1 tablet (50 mg total) by mouth daily. 30 tablet 12  . ondansetron (ZOFRAN) 4 MG tablet Take 1 tablet (4 mg total) by mouth every 8 (eight) hours as needed for  nausea or vomiting. 20 tablet 0  . Polyethyl Glycol-Propyl Glycol (SYSTANE OP) Apply 1 drop to eye daily as needed (for sry eyes).    . predniSONE (DELTASONE) 10 MG tablet   0  . predniSONE (STERAPRED UNI-PAK 21 TAB) 10 MG (21) TBPK tablet Start with 6 tablets daily day one and decrease by 10 mg daily over the next 6 days. 6-5-4-3-2-1 taper 21 tablet 0  . promethazine (PHENERGAN) 25 MG tablet Take 1 tablet (25 mg total) by mouth every 8 (eight) hours as needed for nausea or vomiting. 30 tablet 0  . rosuvastatin (CRESTOR) 10 MG tablet TAKE 1 TABLET BY MOUTH EVERY DAY 30 tablet 5  . sertraline (ZOLOFT) 50 MG tablet TAKE ONE TABLET BY MOUTH DAILY 90 tablet 3   No current facility-administered medications for this visit.    Allergies:   Codeine; Erythromycin; Statins; and Hctz    Social History:  The patient  reports that she quit smoking about 38 years ago. Her smoking use included Cigarettes. She has never used smokeless tobacco. She reports that she does not drink alcohol or use illicit drugs.   Family History:  The patient's family history includes Arthritis in her father; Asthma in her mother; COPD in her mother; Cancer in her father, sister, and son; Crohn's disease in her daughter; Diabetes in her sister; Hypertension in her brother, father, and sister; Prostate cancer in her father; Stroke in her father.    ROS:  Please see the history of present illness. All other systems are reviewed and negative.    PHYSICAL EXAM: VS:  BP 138/82 mmHg  Pulse 73  Wt 196 lb 9.6 oz (89.177 kg)  SpO2 98% , BMI Body mass index is 32.72 kg/(m^2). GEN: Well nourished, well developed, female in no acute distress HEENT: normal for age  Neck: no JVD, no carotid bruit, no masses Cardiac: RRR; soft murmur, no rubs, or gallops Respiratory:  clear to auscultation bilaterally, normal work of breathing GI: soft, nontender, nondistended, + BS MS: no deformity or atrophy; no edema; distal pulses are 2+ in all 4  extremities  Skin: warm and dry, no rash Neuro:  Strength and sensation are intact Psych: euthymic mood, full affect   EKG:  EKG is ordered today. The ekg ordered today demonstrates sinus rhythm, heart rate 73, no acute ischemic changes   Recent Labs: 05/22/2015: BUN 15; Creat 0.73; Hemoglobin 13.2; Platelets 346; Potassium 4.3; Sodium 145    Lipid Panel    Component Value Date/Time   CHOL 218* 09/05/2013 1026   TRIG 195*  09/05/2013 1026   HDL 47 09/05/2013 1026   CHOLHDL 4.6 09/05/2013 1026   VLDL 39 09/05/2013 1026   LDLCALC 132* 09/05/2013 1026     Wt Readings from Last 3 Encounters:  04/02/16 196 lb 9.6 oz (89.177 kg)  09/26/15 196 lb (88.905 kg)  09/06/15 195 lb (88.451 kg)     Other studies Reviewed: Additional studies/ records that were reviewed today include: Previous office notes and testing.  ASSESSMENT AND PLAN:  1.  Preoperative evaluation: She has no history of coronary artery disease. Her EF has been normal in the past. She has no ongoing ischemic symptoms. Her only activity limitations are musculoskeletal. From a cardiac standpoint, no further workup needs to be done as she is at acceptable risk for the upcoming surgeries.  2. PAF: She has only rare palpitations and care remember when the last ones were. She is compliant with the Cardizem and the metoprolol. She takes the metoprolol at night in the Cardizem the morning.  Advised her to continue the Cardizem and metoprolol in the perioperative period.  3. Chronic anticoagulation: CHADS2VASC=3 (HTN, female, age x 1). She needs to continue the Eliquis in general, but it is okay to hold it as needed for the surgical procedures.   Current medicines are reviewed at length with the patient today.  The patient does not have concerns regarding medicines.  The following changes have been made:  no change, but okay to hold anticoagulation as needed for surgical  Labs/ tests ordered today include:  No orders of the  defined types were placed in this encounter.     Disposition:   FU with Dr. Stanford Breed  Signed, Rosaria Ferries, PA-C  04/02/2016 11:25 AM    [place] Phone: [phone]; Fax: [phone]  This note was written with the assistance of speech recognition software. Please excuse any transcriptional errors.

## 2016-04-02 NOTE — Patient Instructions (Signed)
Your physician recommends that you continue on your current medications as directed. Please refer to the Current Medication list given to you today.  Your physician wants you to follow-up in: [place] with Dr. Stanford Breed. You will receive a reminder letter in the mail two months in advance. If you don't receive a letter, please call our office to schedule the follow-up appointment.  Suanne Marker, PA has cleared you for surgery. Continue all medications perioperatively except HOLD eliquis prior to surgery (per Dr. Archie Endo recommendations)

## 2016-04-03 NOTE — Telephone Encounter (Signed)
Please cancel CPE appt for Ms Olivia Reynolds and put Mrs. Olivia Reynolds in that slot for CPE and surgical clearance. She will need fasting labs that day. Surgery scheduled for Wednesday July 12th as I understand it. She will need preop EKG at same time unless Cardiologist did one yesterday.

## 2016-04-03 NOTE — Telephone Encounter (Signed)
Surgical clearance form received via fax.

## 2016-04-03 NOTE — Telephone Encounter (Signed)
Left message for Ms. Olivia Reynolds to call office.

## 2016-04-07 ENCOUNTER — Encounter: Payer: BLUE CROSS/BLUE SHIELD | Admitting: Internal Medicine

## 2016-04-07 ENCOUNTER — Telehealth: Payer: Self-pay | Admitting: Internal Medicine

## 2016-04-07 NOTE — Telephone Encounter (Signed)
Patient called this morning at 9:15 a.m. Stating she had a migraine and would not be able to keep her appointment for CPE/surgical clearance today.  Patient has surgery scheduled with Dr. Para March on 7/19.  Faxing note to Dr. Para March to advise that patient did not keep her appointment this morning for her surgical clearance.  Patient did see Dr. Stanford Breed on Thursday, 7/6 and had EKG completed and was cleared by Dr. Stanford Breed from Cardiology standpoint for surgery.  Not sure if Dr. Para March will accept that and go ahead with surgery or not.  Advised patient that we may not be able to get her back in on our schedule before her scheduled surgery on 7/19 unless we have a cancellation on the schedule.  Patient understands and is apologetic for the circumstance to our schedule this morning.    Advised Dr. Renold Genta of patient's cancellation today.

## 2016-04-09 ENCOUNTER — Other Ambulatory Visit: Payer: Self-pay

## 2016-04-09 MED ORDER — SERTRALINE HCL 50 MG PO TABS: 50.0000 mg | ORAL_TABLET | Freq: Every day | ORAL | Status: DC

## 2016-04-09 MED ORDER — ROSUVASTATIN CALCIUM 10 MG PO TABS
10.0000 mg | ORAL_TABLET | Freq: Every day | ORAL | Status: DC
Start: 2016-04-09 — End: 2017-05-27

## 2016-04-13 ENCOUNTER — Other Ambulatory Visit: Payer: Medicare Other | Admitting: Internal Medicine

## 2016-04-13 ENCOUNTER — Other Ambulatory Visit: Payer: Self-pay | Admitting: Internal Medicine

## 2016-04-13 DIAGNOSIS — I10 Essential (primary) hypertension: Secondary | ICD-10-CM

## 2016-04-13 DIAGNOSIS — Z0000 Encounter for general adult medical examination without abnormal findings: Secondary | ICD-10-CM

## 2016-04-13 DIAGNOSIS — E559 Vitamin D deficiency, unspecified: Secondary | ICD-10-CM | POA: Diagnosis not present

## 2016-04-13 DIAGNOSIS — R7302 Impaired glucose tolerance (oral): Secondary | ICD-10-CM | POA: Diagnosis not present

## 2016-04-13 DIAGNOSIS — E785 Hyperlipidemia, unspecified: Secondary | ICD-10-CM

## 2016-04-13 LAB — LIPID PANEL
Cholesterol: 195 mg/dL (ref 125–200)
HDL: 61 mg/dL (ref >=46)
LDL CALC: 106 mg/dL (ref <130)
TRIGLYCERIDES: 142 mg/dL (ref <150)
Total CHOL/HDL Ratio: 3.2 Ratio (ref <=5.0)
VLDL: 28 mg/dL (ref <30)

## 2016-04-13 LAB — COMPLETE METABOLIC PANEL WITH GFR
ALT: 30 U/L — AB (ref 6–29)
AST: 18 U/L (ref 10–35)
Albumin: 4.1 g/dL (ref 3.6–5.1)
Alkaline Phosphatase: 44 U/L (ref 33–130)
BILIRUBIN TOTAL: 1 mg/dL (ref 0.2–1.2)
BUN: 14 mg/dL (ref 7–25)
CO2: 26 mmol/L (ref 20–31)
CREATININE: 0.92 mg/dL (ref 0.50–0.99)
Calcium: 9.4 mg/dL (ref 8.6–10.4)
Chloride: 109 mmol/L (ref 98–110)
GFR, EST AFRICAN AMERICAN: 76 mL/min (ref >=60)
GFR, Est Non African American: 66 mL/min (ref >=60)
GLUCOSE: 105 mg/dL — AB (ref 65–99)
Potassium: 4.1 mmol/L (ref 3.5–5.3)
Sodium: 144 mmol/L (ref 135–146)
TOTAL PROTEIN: 6.5 g/dL (ref 6.1–8.1)

## 2016-04-13 LAB — CBC WITH DIFFERENTIAL/PLATELET
BASOS PCT: 0 %
Basophils Absolute: 0 cells/uL (ref 0–200)
EOS ABS: 213 {cells}/uL (ref 15–500)
Eosinophils Relative: 3 %
HCT: 41.4 % (ref 35.0–45.0)
Hemoglobin: 13.7 g/dL (ref 11.7–15.5)
Lymphocytes Relative: 39 %
Lymphs Abs: 2769 cells/uL (ref 850–3900)
MCH: 30.4 pg (ref 27.0–33.0)
MCHC: 33.1 g/dL (ref 32.0–36.0)
MCV: 92 fL (ref 80.0–100.0)
MONO ABS: 426 {cells}/uL (ref 200–950)
MONOS PCT: 6 %
MPV: 9 fL (ref 7.5–12.5)
NEUTROS ABS: 3692 {cells}/uL (ref 1500–7800)
Neutrophils Relative %: 52 %
PLATELETS: 232 10*3/uL (ref 140–400)
RBC: 4.5 MIL/uL (ref 3.80–5.10)
RDW: 13.4 % (ref 11.0–15.0)
WBC: 7.1 10*3/uL (ref 3.8–10.8)

## 2016-04-13 LAB — TSH: TSH: 1.95 m[IU]/L

## 2016-04-14 ENCOUNTER — Ambulatory Visit (INDEPENDENT_AMBULATORY_CARE_PROVIDER_SITE_OTHER): Payer: Medicare Other | Admitting: Internal Medicine

## 2016-04-14 ENCOUNTER — Encounter: Payer: Self-pay | Admitting: Internal Medicine

## 2016-04-14 ENCOUNTER — Telehealth: Payer: Self-pay

## 2016-04-14 VITALS — BP 110/78 | HR 72 | Temp 98.3°F | Ht 65.0 in | Wt 194.0 lb

## 2016-04-14 DIAGNOSIS — F32A Depression, unspecified: Secondary | ICD-10-CM

## 2016-04-14 DIAGNOSIS — E669 Obesity, unspecified: Secondary | ICD-10-CM | POA: Diagnosis not present

## 2016-04-14 DIAGNOSIS — M129 Arthropathy, unspecified: Secondary | ICD-10-CM | POA: Diagnosis not present

## 2016-04-14 DIAGNOSIS — Z7901 Long term (current) use of anticoagulants: Secondary | ICD-10-CM | POA: Diagnosis not present

## 2016-04-14 DIAGNOSIS — Z8669 Personal history of other diseases of the nervous system and sense organs: Secondary | ICD-10-CM

## 2016-04-14 DIAGNOSIS — I480 Paroxysmal atrial fibrillation: Secondary | ICD-10-CM

## 2016-04-14 DIAGNOSIS — M19019 Primary osteoarthritis, unspecified shoulder: Secondary | ICD-10-CM

## 2016-04-14 DIAGNOSIS — G4700 Insomnia, unspecified: Secondary | ICD-10-CM | POA: Diagnosis not present

## 2016-04-14 DIAGNOSIS — I517 Cardiomegaly: Secondary | ICD-10-CM | POA: Diagnosis not present

## 2016-04-14 DIAGNOSIS — I10 Essential (primary) hypertension: Secondary | ICD-10-CM

## 2016-04-14 DIAGNOSIS — E785 Hyperlipidemia, unspecified: Secondary | ICD-10-CM | POA: Diagnosis not present

## 2016-04-14 DIAGNOSIS — Z9889 Other specified postprocedural states: Secondary | ICD-10-CM | POA: Diagnosis not present

## 2016-04-14 DIAGNOSIS — Z0000 Encounter for general adult medical examination without abnormal findings: Secondary | ICD-10-CM | POA: Diagnosis not present

## 2016-04-14 DIAGNOSIS — R739 Hyperglycemia, unspecified: Secondary | ICD-10-CM | POA: Diagnosis not present

## 2016-04-14 DIAGNOSIS — F329 Major depressive disorder, single episode, unspecified: Secondary | ICD-10-CM | POA: Diagnosis not present

## 2016-04-14 LAB — POCT URINALYSIS DIP (MANUAL ENTRY)
BILIRUBIN UA: NEGATIVE
Glucose, UA: NEGATIVE
Ketones, POC UA: NEGATIVE
Leukocytes, UA: NEGATIVE
NITRITE UA: NEGATIVE
PH UA: 5
Protein Ur, POC: NEGATIVE
RBC UA: NEGATIVE
Spec Grav, UA: 1.02
Urobilinogen, UA: 0.2

## 2016-04-14 LAB — VITAMIN D 25 HYDROXY (VIT D DEFICIENCY, FRACTURES): VIT D 25 HYDROXY: 12 ng/mL — AB (ref 30–100)

## 2016-04-14 MED ORDER — ERGOCALCIFEROL 1.25 MG (50000 UT) PO CAPS: 50000.0000 [IU] | ORAL_CAPSULE | ORAL | Status: DC

## 2016-04-14 NOTE — Patient Instructions (Signed)
It was pleasure to see you today. Cleared for the surgery tomorrow. Hemoglobin A1c pending. Other labs reviewed. Return in 6 months for follow-up. Continue cardiology follow-up.

## 2016-04-14 NOTE — Progress Notes (Signed)
Subjective:    Patient ID: Olivia Reynolds, female    DOB: [birth year], 65 y.o.   MRN: [id number]  HPI 65 year old Female in today for welcome to Medicare physical exam and  clearance for shoulder surgery which is to occur tomorrow.  She is followed by cardiologist for paroxysmal atrial fibrillation. She had an episode a couple of weeks ago that just lasted a few minutes. She is on chronic anticoagulation therapy for this per cardiologist. Has been told to hold this medication for 4 days prior to procedure which is scheduled for tomorrow.  Just recently seen by Cardiology PA and cleared for surgery from a Cardiology standpoint  In February 2010 she had a 2-D echocardiogram showing normal left ventricular function, mild LVH with impaired relaxation and mild aortic valve calcification. Nuclear study in February 2010 showed ejection fraction of 75% and normal perfusion. Had 2-D echocardiogram in 2015 which was essentially the same as previous 2-D echocardiogram except ejection fraction was 60-65%.  She also has history of hyperlipidemia and obesity.  History of migraine headaches.  Colonoscopy done 2008 by Dr. Rachelle Hora. Transverse colon polyp was removed which was hyperplastic.  Had endoscopy January 2009 showing a 9 cm hiatal hernia which was thought to be causing chest pain at the time. Dr. Kaylyn Lim repaired this April 2009. She had a large type III next hiatal hernia containing about half of the stomach. She had laparoscopic fundoplication.  History of knee surgery in 2004.  Surgery for epicondylitis of the elbow in 2002  Bilateral tubal ligation 1986. 3 pregnancies and no miscarriages.  Social history: She does not smoke. Social alcohol consumption consisting of wine. 3 adult children- 2 sons and a daughter. She is divorced and resides alone. 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  Family history: Mother died of, occasion's of COPD. Father  deceased with history of dementia, hypertension and stroke. Sister with history of diabetes, hypertension and subsequently had bariatric surgery. Another sister in good health. One brother with history of kidney stones gout and allergies.    Review of Systems  Constitutional: Negative.   HENT: Negative.   Eyes: Negative.   Cardiovascular:       Intermittent bouts of atrial fibrillation  Genitourinary: Negative.   Musculoskeletal:       Right shoulder pain scheduled for rotator cuff repair and arthroscopic debridement more  Neurological:       History of migraine headaches particularly if she's not sleeping well. Has not been sleeping well due to shoulder pain  Psychiatric/Behavioral:       History of depression related to divorce       Objective:   Physical Exam  Constitutional: She is oriented to person, place, and time. She appears well-developed and well-nourished. No distress.  HENT:  Head: Normocephalic and atraumatic.  Right Ear: External ear normal.  Left Ear: External ear normal.  Mouth/Throat: Oropharynx is clear and moist. No oropharyngeal exudate.  Eyes: Conjunctivae and EOM are normal. Pupils are equal, round, and reactive to light.  Neck: No JVD present. No thyromegaly present.  Cardiovascular: Normal rate, regular rhythm and normal heart sounds.   No murmur heard. Pulmonary/Chest: Effort normal and breath sounds normal. She has no wheezes. She has no rales.  Breasts normal female without masses  Abdominal: Soft. Bowel sounds are normal. She exhibits no distension and no mass. There is no tenderness. There is no rebound and no guarding.  Genitourinary:  Deferred to GYN  physician  Musculoskeletal: She exhibits no edema.  Lymphadenopathy:    She has no cervical adenopathy.  Neurological: She is alert and oriented to person, place, and time. She has normal reflexes. No cranial nerve deficit. Coordination normal.  Skin: Skin is warm and dry. No rash noted. She is not  diaphoretic.  Psychiatric: She has a normal mood and affect. Her behavior is normal. Judgment and thought content normal.  Vitals reviewed.         Assessment & Plan:  Right rotator cuff repair scheduled for surgery tomorrow  History of paroxysmal atrial fibrillation on chronic anticoagulation  Hyperlipidemia  History of depression  History of migraine headaches  Essential hypertension  Obesity  Elevated fasting serum glucose-hemoglobin A1c in 2014 was 5.4%. Check hemoglobin A1c.  Plan: Continue to encourage diet exercise and weight loss. Patient is cleared for orthopedic surgery tomorrow. She had EKG by cardiologist just recently. Patient is to return in 6 months. At that time she'll have lipid panel liver functions and hemoglobin A1c.  EKG deferred because she had one recently  when seen at cardiology office.  Subjective:   Patient presents for Medicare Annual/Subsequent preventive examination.  Review Past Medical/Family/Social: See above  Risk Factors  Current exercise habits: Tends to exercise more in the fall then the summer Dietary issues discussed: Low fat low carbohydrate  Cardiac risk factors:Hyperlipidemia, paroxysmal atrial fibrillation, family history  Depression Screen  (Note: if answer to either of the following is "Yes", a more complete depression screening is indicated)   Over the past two weeks, have you felt down, depressed or hopeless? No  Over the past two weeks, have you felt little interest or pleasure in doing things? No Have you lost interest or pleasure in daily life? No Do you often feel hopeless? No Do you cry easily over simple problems? No   Activities of Daily Living  In your present state of health, do you have any difficulty performing the following activities?:   Driving? No  Managing money? No  Feeding yourself? No  Getting from bed to chair? No  Climbing a flight of stairs? No  Preparing food and eating?: No  Bathing or  showering? No  Getting dressed: No  Getting to the toilet? No  Using the toilet:No  Moving around from place to place: No  In the past year have you fallen or had a near fall?:No  Are you sexually active? No  Do you have more than one partner? No   Hearing Difficulties: No  Do you often ask people to speak up or repeat themselves? No  Do you experience ringing or noises in your ears? No  Do you have difficulty understanding soft or whispered voices? No  Do you feel that you have a problem with memory? No Do you often misplace items? No    Home Safety:  Do you have a smoke alarm at your residence? Yes Do you have grab bars in the bathroom?Yes Do you have throw rugs in your house? Yes   Cognitive Testing  Alert? Yes Normal Appearance?Yes  Oriented to person? Yes Place? Yes  Time? Yes  Recall of three objects? Yes  Can perform simple calculations? Yes  Displays appropriate judgment?Yes  Can read the correct time from a watch face?Yes   List the Names of Other Physician/Practitioners you currently use:  See referral list for the physicians patient is currently seeing.  Cardiologist  GYN physician   Review of Systems: See above   Objective:  General appearance: Appears stated age and mildly obese  Head: Normocephalic, without obvious abnormality, atraumatic  Eyes: conj clear, EOMi PEERLA  Ears: normal TM's and external ear canals both ears  Nose: Nares normal. Septum midline. Mucosa normal. No drainage or sinus tenderness.  Throat: lips, mucosa, and tongue normal; teeth and gums normal  Neck: no adenopathy, no carotid bruit, no JVD, supple, symmetrical, trachea midline and thyroid not enlarged, symmetric, no tenderness/mass/nodules  No CVA tenderness.  Lungs: clear to auscultation bilaterally  Breasts: normal appearance, no masses or tenderness Heart: regular rate and rhythm, S1, S2 normal, no murmur, click, rub or gallop  Abdomen: soft, non-tender; bowel sounds  normal; no masses, no organomegaly  Musculoskeletal: ROM normal in all joints, no crepitus, no deformity, Normal muscle strengthen. Back  is symmetric, no curvature. Skin: Skin color, texture, turgor normal. No rashes or lesions  Lymph nodes: Cervical, supraclavicular, and axillary nodes normal.  Neurologic: CN 2 -12 Normal, Normal symmetric reflexes. Normal coordination and gait  Psych: Alert & Oriented x 3, Mood appear stable.    Assessment:    Annual wellness medicare exam   Plan:    During the course of the visit the patient was educated and counseled about appropriate screening and preventive services including:  Annual mammogram  Annual flu vaccine  Due for Prevnar 13 which will be given after she recovers from orthopedic surgery      Patient Instructions (the written plan) was given to the patient.  Medicare Attestation  I have personally reviewed:  The patient's medical and social history  Their use of alcohol, tobacco or illicit drugs  Their current medications and supplements  The patient's functional ability including ADLs,fall risks, home safety risks, cognitive, and hearing and visual impairment  Diet and physical activities  Evidence for depression or mood disorders  The patient's weight, height, BMI, and visual acuity have been recorded in the chart. I have made referrals, counseling, and provided education to the patient based on review of the above and I have provided the patient with a written personalized care plan for preventive services.

## 2016-04-14 NOTE — Telephone Encounter (Signed)
Spoke to [employer]. Added on A1C lab to blood drawn on 04/13/16.

## 2016-04-15 DIAGNOSIS — M24111 Other articular cartilage disorders, right shoulder: Secondary | ICD-10-CM | POA: Diagnosis not present

## 2016-04-15 DIAGNOSIS — M7551 Bursitis of right shoulder: Secondary | ICD-10-CM | POA: Diagnosis not present

## 2016-04-15 DIAGNOSIS — M75121 Complete rotator cuff tear or rupture of right shoulder, not specified as traumatic: Secondary | ICD-10-CM | POA: Diagnosis not present

## 2016-04-15 DIAGNOSIS — M66811 Spontaneous rupture of other tendons, right shoulder: Secondary | ICD-10-CM | POA: Diagnosis not present

## 2016-04-15 DIAGNOSIS — M7541 Impingement syndrome of right shoulder: Secondary | ICD-10-CM | POA: Diagnosis not present

## 2016-04-15 DIAGNOSIS — G8918 Other acute postprocedural pain: Secondary | ICD-10-CM | POA: Diagnosis not present

## 2016-04-15 LAB — HEMOGLOBIN A1C
Hgb A1c MFr Bld: 6.1 % — ABNORMAL HIGH (ref <5.7)
MEAN PLASMA GLUCOSE: 128 mg/dL

## 2016-04-16 ENCOUNTER — Other Ambulatory Visit: Payer: Self-pay

## 2016-04-16 ENCOUNTER — Telehealth: Payer: Self-pay

## 2016-04-16 DIAGNOSIS — R7302 Impaired glucose tolerance (oral): Secondary | ICD-10-CM

## 2016-04-16 NOTE — Telephone Encounter (Signed)
-----   Message from Elby Showers, MD sent at 04/16/2016 10:09 AM EDT ----- Pt has impaired glucose tolerance and needs to see dietician with follow up here in 4 months

## 2016-04-16 NOTE — Telephone Encounter (Signed)
Called patient. Gave lab results. Patient verbalized understanding.  Patient requesting referral/recommendation to dietician.  Will schedule 4 month follow up when contacted with dietician Dr. Renold Genta recommends.

## 2016-04-21 DIAGNOSIS — M6281 Muscle weakness (generalized): Secondary | ICD-10-CM | POA: Diagnosis not present

## 2016-04-21 DIAGNOSIS — S46011D Strain of muscle(s) and tendon(s) of the rotator cuff of right shoulder, subsequent encounter: Secondary | ICD-10-CM | POA: Diagnosis not present

## 2016-04-21 DIAGNOSIS — M25611 Stiffness of right shoulder, not elsewhere classified: Secondary | ICD-10-CM | POA: Diagnosis not present

## 2016-04-21 DIAGNOSIS — M25511 Pain in right shoulder: Secondary | ICD-10-CM | POA: Diagnosis not present

## 2016-04-23 DIAGNOSIS — M25511 Pain in right shoulder: Secondary | ICD-10-CM | POA: Diagnosis not present

## 2016-04-23 DIAGNOSIS — M25611 Stiffness of right shoulder, not elsewhere classified: Secondary | ICD-10-CM | POA: Diagnosis not present

## 2016-04-23 DIAGNOSIS — M6281 Muscle weakness (generalized): Secondary | ICD-10-CM | POA: Diagnosis not present

## 2016-04-23 DIAGNOSIS — S46011D Strain of muscle(s) and tendon(s) of the rotator cuff of right shoulder, subsequent encounter: Secondary | ICD-10-CM | POA: Diagnosis not present

## 2016-04-29 ENCOUNTER — Telehealth: Payer: Self-pay

## 2016-04-29 NOTE — Telephone Encounter (Signed)
Spoke to patient. Attempted to schedule PCV-13 injection. Patient states she is recovering from shoulder surgery. Prefers to wait until she is getting out more. She will call to schedule then.

## 2016-04-30 DIAGNOSIS — S46011D Strain of muscle(s) and tendon(s) of the rotator cuff of right shoulder, subsequent encounter: Secondary | ICD-10-CM | POA: Diagnosis not present

## 2016-04-30 DIAGNOSIS — M6281 Muscle weakness (generalized): Secondary | ICD-10-CM | POA: Diagnosis not present

## 2016-04-30 DIAGNOSIS — M25611 Stiffness of right shoulder, not elsewhere classified: Secondary | ICD-10-CM | POA: Diagnosis not present

## 2016-04-30 DIAGNOSIS — M25511 Pain in right shoulder: Secondary | ICD-10-CM | POA: Diagnosis not present

## 2016-05-05 DIAGNOSIS — M6281 Muscle weakness (generalized): Secondary | ICD-10-CM | POA: Diagnosis not present

## 2016-05-05 DIAGNOSIS — M25611 Stiffness of right shoulder, not elsewhere classified: Secondary | ICD-10-CM | POA: Diagnosis not present

## 2016-05-05 DIAGNOSIS — M25511 Pain in right shoulder: Secondary | ICD-10-CM | POA: Diagnosis not present

## 2016-05-05 DIAGNOSIS — S46011D Strain of muscle(s) and tendon(s) of the rotator cuff of right shoulder, subsequent encounter: Secondary | ICD-10-CM | POA: Diagnosis not present

## 2016-05-12 DIAGNOSIS — S46011D Strain of muscle(s) and tendon(s) of the rotator cuff of right shoulder, subsequent encounter: Secondary | ICD-10-CM | POA: Diagnosis not present

## 2016-05-12 DIAGNOSIS — M25611 Stiffness of right shoulder, not elsewhere classified: Secondary | ICD-10-CM | POA: Diagnosis not present

## 2016-05-12 DIAGNOSIS — M6281 Muscle weakness (generalized): Secondary | ICD-10-CM | POA: Diagnosis not present

## 2016-05-12 DIAGNOSIS — M25511 Pain in right shoulder: Secondary | ICD-10-CM | POA: Diagnosis not present

## 2016-05-13 DIAGNOSIS — M25511 Pain in right shoulder: Secondary | ICD-10-CM | POA: Diagnosis not present

## 2016-05-13 DIAGNOSIS — M6281 Muscle weakness (generalized): Secondary | ICD-10-CM | POA: Diagnosis not present

## 2016-05-13 DIAGNOSIS — M25611 Stiffness of right shoulder, not elsewhere classified: Secondary | ICD-10-CM | POA: Diagnosis not present

## 2016-05-13 DIAGNOSIS — S46011D Strain of muscle(s) and tendon(s) of the rotator cuff of right shoulder, subsequent encounter: Secondary | ICD-10-CM | POA: Diagnosis not present

## 2016-05-18 DIAGNOSIS — M6281 Muscle weakness (generalized): Secondary | ICD-10-CM | POA: Diagnosis not present

## 2016-05-18 DIAGNOSIS — M25511 Pain in right shoulder: Secondary | ICD-10-CM | POA: Diagnosis not present

## 2016-05-18 DIAGNOSIS — M25611 Stiffness of right shoulder, not elsewhere classified: Secondary | ICD-10-CM | POA: Diagnosis not present

## 2016-05-18 DIAGNOSIS — S46011D Strain of muscle(s) and tendon(s) of the rotator cuff of right shoulder, subsequent encounter: Secondary | ICD-10-CM | POA: Diagnosis not present

## 2016-05-20 DIAGNOSIS — S46011D Strain of muscle(s) and tendon(s) of the rotator cuff of right shoulder, subsequent encounter: Secondary | ICD-10-CM | POA: Diagnosis not present

## 2016-05-20 DIAGNOSIS — M25611 Stiffness of right shoulder, not elsewhere classified: Secondary | ICD-10-CM | POA: Diagnosis not present

## 2016-05-20 DIAGNOSIS — M6281 Muscle weakness (generalized): Secondary | ICD-10-CM | POA: Diagnosis not present

## 2016-05-20 DIAGNOSIS — M25511 Pain in right shoulder: Secondary | ICD-10-CM | POA: Diagnosis not present

## 2016-05-21 DIAGNOSIS — M25511 Pain in right shoulder: Secondary | ICD-10-CM | POA: Diagnosis not present

## 2016-05-27 DIAGNOSIS — M25611 Stiffness of right shoulder, not elsewhere classified: Secondary | ICD-10-CM | POA: Diagnosis not present

## 2016-05-27 DIAGNOSIS — S46011D Strain of muscle(s) and tendon(s) of the rotator cuff of right shoulder, subsequent encounter: Secondary | ICD-10-CM | POA: Diagnosis not present

## 2016-05-27 DIAGNOSIS — M25511 Pain in right shoulder: Secondary | ICD-10-CM | POA: Diagnosis not present

## 2016-05-27 DIAGNOSIS — M6281 Muscle weakness (generalized): Secondary | ICD-10-CM | POA: Diagnosis not present

## 2016-06-02 DIAGNOSIS — M25611 Stiffness of right shoulder, not elsewhere classified: Secondary | ICD-10-CM | POA: Diagnosis not present

## 2016-06-02 DIAGNOSIS — S46011D Strain of muscle(s) and tendon(s) of the rotator cuff of right shoulder, subsequent encounter: Secondary | ICD-10-CM | POA: Diagnosis not present

## 2016-06-02 DIAGNOSIS — M6281 Muscle weakness (generalized): Secondary | ICD-10-CM | POA: Diagnosis not present

## 2016-06-02 DIAGNOSIS — M25511 Pain in right shoulder: Secondary | ICD-10-CM | POA: Diagnosis not present

## 2016-06-03 DIAGNOSIS — M6281 Muscle weakness (generalized): Secondary | ICD-10-CM | POA: Diagnosis not present

## 2016-06-03 DIAGNOSIS — M25611 Stiffness of right shoulder, not elsewhere classified: Secondary | ICD-10-CM | POA: Diagnosis not present

## 2016-06-03 DIAGNOSIS — S46011D Strain of muscle(s) and tendon(s) of the rotator cuff of right shoulder, subsequent encounter: Secondary | ICD-10-CM | POA: Diagnosis not present

## 2016-06-03 DIAGNOSIS — M25511 Pain in right shoulder: Secondary | ICD-10-CM | POA: Diagnosis not present

## 2016-06-08 ENCOUNTER — Other Ambulatory Visit: Payer: Self-pay | Admitting: Cardiology

## 2016-06-08 DIAGNOSIS — I480 Paroxysmal atrial fibrillation: Secondary | ICD-10-CM

## 2016-06-09 NOTE — Telephone Encounter (Signed)
Rx(s) sent to pharmacy electronically.  

## 2016-06-18 ENCOUNTER — Emergency Department (HOSPITAL_COMMUNITY)
Admission: EM | Admit: 2016-06-18 | Discharge: 2016-06-19 | Disposition: A | Discharge status: Home / Self Care | Payer: Medicare Other | Attending: Emergency Medicine | Admitting: Emergency Medicine

## 2016-06-18 ENCOUNTER — Emergency Department (HOSPITAL_COMMUNITY): Payer: Medicare Other

## 2016-06-18 ENCOUNTER — Encounter (HOSPITAL_COMMUNITY): Payer: Self-pay

## 2016-06-18 DIAGNOSIS — S46011D Strain of muscle(s) and tendon(s) of the rotator cuff of right shoulder, subsequent encounter: Secondary | ICD-10-CM | POA: Diagnosis not present

## 2016-06-18 DIAGNOSIS — W108XXA Fall (on) (from) other stairs and steps, initial encounter: Secondary | ICD-10-CM | POA: Diagnosis not present

## 2016-06-18 DIAGNOSIS — Z87891 Personal history of nicotine dependence: Secondary | ICD-10-CM | POA: Diagnosis not present

## 2016-06-18 DIAGNOSIS — I10 Essential (primary) hypertension: Secondary | ICD-10-CM | POA: Diagnosis not present

## 2016-06-18 DIAGNOSIS — Y939 Activity, unspecified: Secondary | ICD-10-CM | POA: Insufficient documentation

## 2016-06-18 DIAGNOSIS — S92102A Unspecified fracture of left talus, initial encounter for closed fracture: Secondary | ICD-10-CM | POA: Diagnosis not present

## 2016-06-18 DIAGNOSIS — W19XXXA Unspecified fall, initial encounter: Secondary | ICD-10-CM

## 2016-06-18 DIAGNOSIS — Z79899 Other long term (current) drug therapy: Secondary | ICD-10-CM | POA: Insufficient documentation

## 2016-06-18 DIAGNOSIS — S99911A Unspecified injury of right ankle, initial encounter: Secondary | ICD-10-CM | POA: Diagnosis not present

## 2016-06-18 DIAGNOSIS — S92152A Displaced avulsion fracture (chip fracture) of left talus, initial encounter for closed fracture: Secondary | ICD-10-CM | POA: Diagnosis not present

## 2016-06-18 DIAGNOSIS — S99912A Unspecified injury of left ankle, initial encounter: Secondary | ICD-10-CM | POA: Diagnosis present

## 2016-06-18 DIAGNOSIS — Y999 Unspecified external cause status: Secondary | ICD-10-CM | POA: Insufficient documentation

## 2016-06-18 DIAGNOSIS — M542 Cervicalgia: Secondary | ICD-10-CM | POA: Diagnosis not present

## 2016-06-18 DIAGNOSIS — R6889 Other general symptoms and signs: Secondary | ICD-10-CM | POA: Diagnosis not present

## 2016-06-18 DIAGNOSIS — M79604 Pain in right leg: Secondary | ICD-10-CM | POA: Diagnosis not present

## 2016-06-18 DIAGNOSIS — M25511 Pain in right shoulder: Secondary | ICD-10-CM | POA: Diagnosis not present

## 2016-06-18 DIAGNOSIS — Y929 Unspecified place or not applicable: Secondary | ICD-10-CM | POA: Insufficient documentation

## 2016-06-18 MED ORDER — ACETAMINOPHEN 325 MG PO TABS
650.0000 mg | ORAL_TABLET | Freq: Once | ORAL | Status: AC
  Administered 2016-06-19: 650 mg via ORAL
  Filled 2016-06-18: qty 2

## 2016-06-18 NOTE — ED Triage Notes (Signed)
PT ARRIVED VIA EMS FOR A FALL DOWN THE STAIRS. PER EMS, THE PT'S PURSE GOT CAUGHT ON THE RAIL, SENDING THE PT DOWN. -HEAD INJURY OR LOC. PT C/O PAIN AND SWELLING TO THE RIGHT LOWER LEG, RIGHT ANKLE, AND LEFT ANKLE. PT TAKE ELOQUIS.

## 2016-06-18 NOTE — ED Notes (Signed)
Pt stated "My pocket book handle got caught on the hand rail and I went down 2-3 steps.  I had recent right rotator cuff surgery by Dr. [name] 8 wks ago.  Currently doing PT.  It hurts about a 3 but that's where it's been."  Pt presents with bruising & edema to L lateral foot.

## 2016-06-18 NOTE — Progress Notes (Addendum)
[facility] consulted for a walker.  [facility] retrieved walker from [facility] closet, placed order for walker and face sheet on table in [facility] closet.  Provided patient with adult rolling walker.  No further EDCM needs at this time.  Discussed with EDRN. Discussed with EDP.  Patient is agreeable to receive dme walker from [facility].  [facility] informed patient that she may be receiving a phone call from them.  She verbalized understanding and very thankful for services.  No further EDCM needs at this time.

## 2016-06-18 NOTE — ED Notes (Signed)
Pt stated "I would like something to take for pain @ home but I can't take anything with oxy or hydrocodone, or ultram."  Informed pt would speak to MD.

## 2016-06-18 NOTE — ED Provider Notes (Addendum)
[name] Provider Note   CSN: [id number] Arrival date & time: 06/18/16  2004     History   Chief Complaint Chief Complaint  Patient presents with  . Fall    HPI Olivia Reynolds is a 65 y.o. female.  HPI Pt was walking down the steps when her purse got caught.  She got twisted and fell down 3-4 steps.  She had trouble putting weight on her left foot.  She is having soreness in her right shoulder as well.  SHe does take eliquis and she wanted to make sure everything is ok. No head injury.  No LOC. Past Medical History:  Diagnosis Date  . Aortic valve calcification    Mild per echo in February of 2010  . Chronic headaches   . Depression   . Diverticulosis   . GERD (gastroesophageal reflux disease)   . History of hiatal hernia    with repair  . HTN (hypertension)   . Hyperlipidemia   . Obesity   . PAF (paroxysmal atrial fibrillation) [facility]     Patient Active Problem List   Diagnosis Date Noted  . Osteopenia 08/15/2013  . Dyspnea 05/31/2013  . Personal history of colonic polyps- right sided small serrated polyps but no adenomas 09/26/2012  . Musculoskeletal pain 09/04/2012  . Anxiety 09/04/2012  . History of migraine headaches 09/04/2012  . Insomnia 10/25/2011  . Depression 10/25/2011  . PAF (paroxysmal atrial fibrillation) ([place])   . Hyperlipidemia   . HTN (hypertension)   . Obesity     Past Surgical History:  Procedure Laterality Date  . COLONOSCOPY    . ELBOW SURGERY  left   Tendon Surgery  . EYE SURGERY     L-yey myopia  . HERNIA REPAIR  [id number]   nissan fundiplication  . KNEE ARTHROSCOPY  2005   R-knee  . LAPAROSCOPIC NISSEN FUNDOPLICATION    . TONSILLECTOMY    . TUBAL LIGATION    . UPPER GASTROINTESTINAL ENDOSCOPY      OB History    Gravida Para Term Preterm AB Living   3 3       3    SAB TAB Ectopic Multiple Live Births                   Home Medications    Prior to Admission medications   Medication Sig Start Date End Date  Taking? Authorizing Provider  acetaminophen (TYLENOL) 500 MG tablet Take 500 mg by mouth every 6 (six) hours as needed.    Historical Provider, MD  butalbital-acetaminophen-caffeine (FIORICET, ESGIC) 50-325-40 MG tablet TAKE 1 TO 2 TABLETS BY MOUTH EVERY 4 TO 6 HOURS AS NEEDED FOR HEADACHE( DO NOT EXCEED 6 TABLETS IN 24 HOURS) Patient not taking: Reported on 04/14/2016 01/21/16   Elby Showers, MD  CARTIA XT 180 MG 24 hr capsule TAKE 1 CAPSULE(180 MG) BY MOUTH DAILY AFTER BREAKFAST 06/09/16   Lelon Perla, MD  Cholecalciferol (VITAMIN D PO) Take 5,000 Units by mouth daily. Reported on 04/14/2016    Historical Provider, MD  diazepam (VALIUM) 10 MG tablet TAKE 1 TABLET BY MOUTH EVERY NIGHT AT BEDTIME AS NEEDED FOR SLEEP 12/12/15   Elby Showers, MD  ELIQUIS 5 MG TABS tablet TAKE 1 TABLET BY MOUTH TWICE DAILY Patient not taking: Reported on 04/14/2016 10/11/15   Lelon Perla, MD  ergocalciferol (DRISDOL) 50000 units capsule Take 1 capsule (50,000 Units total) by mouth once a week. For 12 weeks. 04/14/16   Stanton Kidney  Gerrianne Scale, MD  metoprolol succinate (TOPROL-XL) 50 MG 24 hr tablet Take 1 tablet (50 mg total) by mouth daily. 05/29/15   Lelon Perla, MD  Polyethyl Glycol-Propyl Glycol (SYSTANE OP) Apply 1 drop to eye daily as needed (for sry eyes).    Historical Provider, MD  predniSONE (STERAPRED UNI-PAK 21 TAB) 10 MG (21) TBPK tablet Start with 6 tablets daily day one and decrease by 10 mg daily over the next 6 days. 6-5-4-3-2-1 taper Patient not taking: Reported on 04/14/2016 09/26/15   Elby Showers, MD  rosuvastatin (CRESTOR) 10 MG tablet Take 1 tablet (10 mg total) by mouth daily. 04/09/16   Lelon Perla, MD  sertraline (ZOLOFT) 50 MG tablet Take 1 tablet (50 mg total) by mouth daily. 04/09/16   Lelon Perla, MD    Family History Family History  Problem Relation Age of Onset  . COPD Mother   . Asthma Mother   . Arthritis Father   . Hypertension Father   . Stroke Father   . Cancer Father    . Prostate cancer Father   . Hypertension Sister   . Diabetes Sister   . Hypertension Brother   . Cancer Sister     UTERINE  . Diabetes      sibling  . Cancer Son     TESTICULAR  . Crohn's disease Daughter     Social History Social History  Substance Use Topics  . Smoking status: Former Smoker    Types: Cigarettes    Quit date: 09/06/1977  . Smokeless tobacco: Never Used  . Alcohol use No     Comment: once a month     Allergies   Codeine; Erythromycin; Statins; and Hctz [hydrochlorothiazide]   Review of Systems Review of Systems  All other systems reviewed and are negative.    Physical Exam Updated Vital Signs BP 153/80 (BP Location: Left Arm)   Pulse 111   Temp 98.6 F (37 C) (Oral)   Resp 20   Ht 5\' 5"  (1.651 m)   Wt 86.2 kg   SpO2 96%   BMI 31.62 kg/m   Physical Exam  Constitutional: She appears well-developed and well-nourished. No distress.  HENT:  Head: Normocephalic and atraumatic.  Right Ear: External ear normal.  Left Ear: External ear normal.  Eyes: Conjunctivae are normal. Right eye exhibits no discharge. Left eye exhibits no discharge. No scleral icterus.  Neck: Neck supple. No tracheal deviation present.  Cardiovascular: Normal rate.   Pulmonary/Chest: Effort normal. No stridor. No respiratory distress.  Abdominal: She exhibits no distension.  Musculoskeletal: She exhibits no edema.       Right ankle: Tenderness.       Left ankle: Tenderness.       Cervical back: Normal.       Thoracic back: Normal.       Lumbar back: Normal.       Right lower leg: She exhibits tenderness.       Left foot: Normal.  Neurological: She is alert. Cranial nerve deficit: no gross deficits.  Skin: Skin is warm and dry. No rash noted.  Psychiatric: She has a normal mood and affect.  Nursing note and vitals reviewed.    ED Treatments / Results   Radiology Dg Tibia/fibula Right  Result Date: 06/18/2016 CLINICAL DATA:  [place] downstairs tonight.  Right  leg pain. EXAM: RIGHT TIBIA AND FIBULA - 2 VIEW COMPARISON:  None. FINDINGS: The knee joint is maintained. Subchondral cystic changes are noted  in the patella. No knee joint effusion. The tibia and fibula are intact. IMPRESSION: No acute fracture. Electronically Signed   By: Marijo Sanes M.D.   On: 06/18/2016 21:35   Dg Ankle Complete Left  Result Date: 06/18/2016 CLINICAL DATA:  [place] downstairs tonight. EXAM: LEFT ANKLE COMPLETE - 3+ VIEW COMPARISON:  None FINDINGS: The ankle mortise is maintained. No acute ankle fracture is identified. There is an avulsion fracture involving the distal dorsal talus with overlying soft tissue swelling. IMPRESSION: No acute ankle fracture. Avulsion fracture involving the distal dorsal talus. Electronically Signed   By: Marijo Sanes M.D.   On: 06/18/2016 21:29   Dg Ankle Complete Right  Result Date: 06/18/2016 CLINICAL DATA:  [place] downstairs tonight. EXAM: RIGHT ANKLE - COMPLETE 3+ VIEW COMPARISON:  None. FINDINGS: The ankle mortise is maintained. No acute ankle fracture or osteochondral lesion. No joint effusion. The mid and hindfoot bony structures are intact. IMPRESSION: No acute fracture. Electronically Signed   By: Marijo Sanes M.D.   On: 06/18/2016 21:31   Dg Foot Complete Left  Result Date: 06/18/2016 CLINICAL DATA:  [place] downstairs tonight. EXAM: LEFT FOOT - COMPLETE 3+ VIEW COMPARISON:  Left ankle films, same date. FINDINGS: The joint spaces are maintained. Mild degenerative changes at the first metatarsal phalangeal joint and possible old avulsion fracture. Avulsion fracture off the distal dorsal talus. No other foot fractures are identified. IMPRESSION: Avulsion fracture off of the distal dorsal talus. No other definite foot fractures. Electronically Signed   By: Marijo Sanes M.D.   On: 06/18/2016 21:33    Procedures Procedures (including critical care time)  Medications Ordered in ED Medications  acetaminophen (TYLENOL) tablet 650 mg (not  administered)     Initial Impression / Assessment and Plan / ED Course  I have reviewed the triage vital signs and the nursing notes.  Pertinent labs & imaging results that were available during my care of the patient were reviewed by me and considered in my medical decision making (see chart for details).  Clinical Course  Comment By Time  Discussed pain meds with patient  .She does not want ultram or narcotics and she cannot take nsaids.   Will go with tylenol Dorie Rank, MD 09/22 0021    Xrays reviewed.  Placed in a cam walker.  Pt given a walker to help her remain non weight bearing.  Dc with outpatient ortho follow up  Final Clinical Impressions(s) / ED Diagnoses   Final diagnoses:  Fall, initial encounter  Talus fracture, left, closed, initial encounter    New Prescriptions New Prescriptions   No medications on file     Dorie Rank, MD 06/19/16 1707 Late entry PE    Dorie Rank, MD 07/30/16 1641

## 2016-06-19 NOTE — ED Notes (Signed)
Pt stated "I don't think I want to wait on those x-ray results.  I'm afraid to wait any longer.  They can call me tomorrow with the results."  Informed pt results will not be called but will inform Dr. Tomi Bamberger of her request.

## 2016-06-25 ENCOUNTER — Other Ambulatory Visit: Payer: Self-pay | Admitting: Cardiology

## 2016-06-26 NOTE — Telephone Encounter (Signed)
Rx(s) sent to pharmacy electronically.  

## 2016-06-30 DIAGNOSIS — M545 Low back pain: Secondary | ICD-10-CM | POA: Diagnosis not present

## 2016-06-30 DIAGNOSIS — S8011XA Contusion of right lower leg, initial encounter: Secondary | ICD-10-CM | POA: Diagnosis not present

## 2016-07-06 ENCOUNTER — Other Ambulatory Visit: Payer: Self-pay | Admitting: Cardiology

## 2016-07-07 ENCOUNTER — Encounter: Payer: Self-pay | Admitting: Internal Medicine

## 2016-07-07 ENCOUNTER — Telehealth: Payer: Self-pay | Admitting: Internal Medicine

## 2016-07-07 DIAGNOSIS — J069 Acute upper respiratory infection, unspecified: Secondary | ICD-10-CM

## 2016-07-07 MED ORDER — AZITHROMYCIN 250 MG PO TABS: ORAL_TABLET | ORAL | 0 refills | Status: DC

## 2016-07-07 NOTE — Telephone Encounter (Signed)
Informed patient that the doctor called in a Z-pack for her.

## 2016-07-07 NOTE — Telephone Encounter (Signed)
Patient called with URI symptoms. She is insistent she cannot come the office due to ankle fracture.: Zithromax Z-Pak to pharmacy.

## 2016-07-07 NOTE — Telephone Encounter (Signed)
Patient expressed she is experiencing the following and can not come in to see Dr. Renold Genta because she has to keep her foot elevated from a broken ankle:   1) sore throat  2) coughing hard  3) yellow mucus   4) sinus pain  5) rgt ear pain  She wants to know if the doctor can call her in something for this.

## 2016-07-07 NOTE — Telephone Encounter (Signed)
Will call in Z pak for her

## 2016-07-16 DIAGNOSIS — M25672 Stiffness of left ankle, not elsewhere classified: Secondary | ICD-10-CM | POA: Diagnosis not present

## 2016-07-16 DIAGNOSIS — R262 Difficulty in walking, not elsewhere classified: Secondary | ICD-10-CM | POA: Diagnosis not present

## 2016-07-16 DIAGNOSIS — S92155D Nondisplaced avulsion fracture (chip fracture) of left talus, subsequent encounter for fracture with routine healing: Secondary | ICD-10-CM | POA: Diagnosis not present

## 2016-07-22 ENCOUNTER — Ambulatory Visit (HOSPITAL_COMMUNITY)
Admission: EM | Admit: 2016-07-22 | Discharge: 2016-07-22 | Disposition: A | Discharge status: Home / Self Care | Payer: Medicare Other | Attending: Emergency Medicine | Admitting: Emergency Medicine

## 2016-07-22 ENCOUNTER — Encounter (HOSPITAL_COMMUNITY): Payer: Self-pay | Admitting: Emergency Medicine

## 2016-07-22 DIAGNOSIS — Z87891 Personal history of nicotine dependence: Secondary | ICD-10-CM | POA: Diagnosis not present

## 2016-07-22 DIAGNOSIS — Z7901 Long term (current) use of anticoagulants: Secondary | ICD-10-CM | POA: Diagnosis not present

## 2016-07-22 DIAGNOSIS — Z79899 Other long term (current) drug therapy: Secondary | ICD-10-CM | POA: Insufficient documentation

## 2016-07-22 DIAGNOSIS — R109 Unspecified abdominal pain: Secondary | ICD-10-CM

## 2016-07-22 DIAGNOSIS — R112 Nausea with vomiting, unspecified: Secondary | ICD-10-CM | POA: Diagnosis present

## 2016-07-22 DIAGNOSIS — N12 Tubulo-interstitial nephritis, not specified as acute or chronic: Secondary | ICD-10-CM

## 2016-07-22 LAB — POCT URINALYSIS DIP (DEVICE)
GLUCOSE, UA: NEGATIVE mg/dL
LEUKOCYTES UA: NEGATIVE
Nitrite: POSITIVE — AB
PROTEIN: 30 mg/dL — AB
SPECIFIC GRAVITY, URINE: 1.02 (ref 1.005–1.030)
Urobilinogen, UA: 0.2 mg/dL (ref 0.0–1.0)
pH: 6 (ref 5.0–8.0)

## 2016-07-22 MED ORDER — ONDANSETRON 8 MG PO TBDP: 8.0000 mg | ORAL_TABLET | Freq: Three times a day (TID) | ORAL | 0 refills | Status: DC | PRN

## 2016-07-22 MED ORDER — LIDOCAINE HCL (PF) 1 % IJ SOLN
INTRAMUSCULAR | Status: AC
  Filled 2016-07-22: qty 2

## 2016-07-22 MED ORDER — CEFTRIAXONE SODIUM 1 G IJ SOLR
INTRAMUSCULAR | Status: AC
  Filled 2016-07-22: qty 10

## 2016-07-22 MED ORDER — ONDANSETRON 4 MG PO TBDP
ORAL_TABLET | ORAL | Status: AC
  Filled 2016-07-22: qty 2

## 2016-07-22 MED ORDER — CEFTRIAXONE SODIUM 1 G IJ SOLR
1.0000 g | Freq: Once | INTRAMUSCULAR | Status: AC
  Administered 2016-07-22: 1 g via INTRAMUSCULAR

## 2016-07-22 MED ORDER — PHENAZOPYRIDINE HCL 200 MG PO TABS: 200.0000 mg | ORAL_TABLET | Freq: Three times a day (TID) | ORAL | 0 refills | Status: DC | PRN

## 2016-07-22 MED ORDER — CEPHALEXIN 500 MG PO CAPS: 500.0000 mg | ORAL_CAPSULE | Freq: Three times a day (TID) | ORAL | 0 refills | Status: DC

## 2016-07-22 MED ORDER — ONDANSETRON 4 MG PO TBDP
8.0000 mg | ORAL_TABLET | Freq: Once | ORAL | Status: AC
  Administered 2016-07-22: 8 mg via ORAL

## 2016-07-22 NOTE — ED Notes (Signed)
Patient not ready for discharge, patient sipping on water, needing more urine to send culture.

## 2016-07-22 NOTE — ED Provider Notes (Signed)
HPI  SUBJECTIVE:  Olivia Reynolds is a 65 y.o. female who presents with nausea and vomiting for the past 3 days. She states that she has had approximately one episode of vomiting per day. She reports nonmigratory left lower quadrant pain that she describes as sharp, waxes and wanes. It radiates to her left lower back. Pain started 2 days ago. States is identical to previous UTIs. She reports urinary urgency, frequency, some anorexia. She tried a heating pad. There are no alleviating factors. Symptoms are worse with movement. She reports a fever to 100 last night. She reports watery, loose stools, but no diarrhea. No dysuria, cloudy or odorous urine, hematuria. No abdominal distention. No hematochezia, melena. No vaginal complaints. Symptoms are not associated with eating, fasting, urination, defecation. No syncope. 6 she has a past medical history of diverticulosis found on colonoscopy. Atrial fibrillation on eloquent, hypertension, hypercholesterolemia, UTI, GERD. Status post Nissen fundoplication. No other abdominal surgeries. No history of pyelonephritis, nephrolithiasis, kidney disease. Family history significant for nephrolithiasis and her brother. PMD: Marjo Bicker.   Past Medical History:  Diagnosis Date  . Aortic valve calcification    Mild per echo in February of 2010  . Chronic headaches   . Depression   . Diverticulosis   . GERD (gastroesophageal reflux disease)   . History of hiatal hernia    with repair  . HTN (hypertension)   . Hyperlipidemia   . Obesity   . PAF (paroxysmal atrial fibrillation) ([place])     Past Surgical History:  Procedure Laterality Date  . COLONOSCOPY    . ELBOW SURGERY  left   Tendon Surgery  . EYE SURGERY     L-yey myopia  . HERNIA REPAIR  [id number]   nissan fundiplication  . KNEE ARTHROSCOPY  2005   R-knee  . LAPAROSCOPIC NISSEN FUNDOPLICATION    . TONSILLECTOMY    . TUBAL LIGATION    . UPPER GASTROINTESTINAL ENDOSCOPY      Family History   Problem Relation Age of Onset  . COPD Mother   . Asthma Mother   . Arthritis Father   . Hypertension Father   . Stroke Father   . Cancer Father   . Prostate cancer Father   . Hypertension Sister   . Diabetes Sister   . Hypertension Brother   . Cancer Sister     UTERINE  . Diabetes      sibling  . Cancer Son     TESTICULAR  . Crohn's disease Daughter     Social History  Substance Use Topics  . Smoking status: Former Smoker    Types: Cigarettes    Quit date: 09/06/1977  . Smokeless tobacco: Never Used  . Alcohol use No     Comment: once a month    No current facility-administered medications for this encounter.   Current Outpatient Prescriptions:  .  butalbital-acetaminophen-caffeine (FIORICET, ESGIC) 50-325-40 MG tablet, TAKE 1 TO 2 TABLETS BY MOUTH EVERY 4 TO 6 HOURS AS NEEDED FOR HEADACHE( DO NOT EXCEED 6 TABLETS IN 24 HOURS), Disp: 30 tablet, Rfl: 0 .  CARTIA XT 180 MG 24 hr capsule, TAKE 1 CAPSULE(180 MG) BY MOUTH DAILY AFTER BREAKFAST, Disp: 30 capsule, Rfl: 6 .  Cholecalciferol (VITAMIN D PO), Take 5,000 Units by mouth daily. Reported on 04/14/2016, Disp: , Rfl:  .  ELIQUIS 5 MG TABS tablet, TAKE 1 TABLET BY MOUTH TWICE DAILY, Disp: 60 tablet, Rfl: 6 .  ergocalciferol (DRISDOL) 50000 units capsule, Take 1 capsule (  50,000 Units total) by mouth once a week. For 12 weeks., Disp: 90 capsule, Rfl: 0 .  metoprolol succinate (TOPROL-XL) 50 MG 24 hr tablet, TAKE 1 TABLET(50 MG) BY MOUTH DAILY, Disp: 30 tablet, Rfl: 10 .  rosuvastatin (CRESTOR) 10 MG tablet, TAKE 1 TABLET BY MOUTH EVERY DAY, Disp: 30 tablet, Rfl: 0 .  sertraline (ZOLOFT) 50 MG tablet, Take 1 tablet (50 mg total) by mouth daily., Disp: 90 tablet, Rfl: 3 .  acetaminophen (TYLENOL) 500 MG tablet, Take 500 mg by mouth every 6 (six) hours as needed., Disp: , Rfl:  .  cephALEXin (KEFLEX) 500 MG capsule, Take 1 capsule (500 mg total) by mouth 3 (three) times daily. X 14 days, Disp: 42 capsule, Rfl: 0 .  diazepam  (VALIUM) 10 MG tablet, TAKE 1 TABLET BY MOUTH EVERY NIGHT AT BEDTIME AS NEEDED FOR SLEEP, Disp: 30 tablet, Rfl: 0 .  ondansetron (ZOFRAN ODT) 8 MG disintegrating tablet, Take 1 tablet (8 mg total) by mouth every 8 (eight) hours as needed for nausea or vomiting., Disp: 20 tablet, Rfl: 0 .  phenazopyridine (PYRIDIUM) 200 MG tablet, Take 1 tablet (200 mg total) by mouth 3 (three) times daily as needed for pain., Disp: 6 tablet, Rfl: 0 .  Polyethyl Glycol-Propyl Glycol (SYSTANE OP), Apply 1 drop to eye daily as needed (for sry eyes)., Disp: , Rfl:  .  predniSONE (STERAPRED UNI-PAK 21 TAB) 10 MG (21) TBPK tablet, Start with 6 tablets daily day one and decrease by 10 mg daily over the next 6 days. 6-5-4-3-2-1 taper (Patient not taking: Reported on 07/22/2016), Disp: 21 tablet, Rfl: 0 .  rosuvastatin (CRESTOR) 10 MG tablet, Take 1 tablet (10 mg total) by mouth daily., Disp: 30 tablet, Rfl: 5  Allergies  Allergen Reactions  . Codeine Hives  . Erythromycin Hives  . Statins     Lipitor  . Hctz [Hydrochlorothiazide] Rash     ROS  As noted in HPI.   Physical Exam  BP 141/67 (BP Location: Left Arm)   Pulse 97   Temp 98.3 F (36.8 C) (Oral)   Resp 18   SpO2 97%   Constitutional: Well developed, well nourished, Appears to be in mild painful distress Eyes:  EOMI, conjunctiva normal bilaterally HENT: Normocephalic, atraumatic,mucus membranes moist Respiratory: Normal inspiratory effort Cardiovascular: Normal rate GI: nondistended.  positive suprapubic, left flank tenderness. No guarding, rebound. Normal bowel sounds. Back: Positive left CVA tenderness skin: No rash, skin intact Musculoskeletal: no deformities Neurologic: Alert & oriented x 3, no focal neuro deficits Psychiatric: Speech and behavior appropriate   ED Course   Medications  ondansetron (ZOFRAN-ODT) disintegrating tablet 8 mg (8 mg Oral Given 07/22/16 1131)  cefTRIAXone (ROCEPHIN) injection 1 g (1 g Intramuscular Given  07/22/16 1146)    Orders Placed This Encounter  Procedures  . Urine culture    Standing Status:   Standing    Number of Occurrences:   1  . POCT urinalysis dip (device)    Standing Status:   Standing    Number of Occurrences:   1    Results for orders placed or performed during the hospital encounter of 07/22/16 (from the past 24 hour(s))  POCT urinalysis dip (device)     Status: Abnormal   Collection Time: 07/22/16 10:37 AM  Result Value Ref Range   Glucose, UA NEGATIVE NEGATIVE mg/dL   Bilirubin Urine SMALL (A) NEGATIVE   Ketones, ur TRACE (A) NEGATIVE mg/dL   Specific Gravity, Urine 1.020 1.005 - 1.030  Hgb urine dipstick TRACE (A) NEGATIVE   pH 6.0 5.0 - 8.0   Protein, ur 30 (A) NEGATIVE mg/dL   Urobilinogen, UA 0.2 0.0 - 1.0 mg/dL   Nitrite POSITIVE (A) NEGATIVE   Leukocytes, UA NEGATIVE NEGATIVE   No results found.  ED Clinical Impression  Pyelonephritis  Abdominal pain, unspecified abdominal location   ED Assessment/Plan  UA suggestive of UTI, exam is consistent with pyelonephritis. Also the differential is kidney stone, diverticulitis, , colitis, or GYN causes of her symptoms, however based on H&P feel that this is less likely. Giving 1 g of rocephin, will send home with Pyridium, Zofran, Keflex 500 mg 3 times a day for 10 days. Urine sent off for culture.  Patient unable to take NSAIDs due to atrial fibrillation, elilquis  She declined a prescription for tramadol, she declines an abdominal film here today To evaluate for  nephrolithiasis.   She'll follow-up with Dr. Renold Genta in several days. Gave Patient very strict ER return precautions.  Discussed labs, MDM, plan and followup with patient . Discussed sn/sx that should prompt return to the ED. Patient agrees with plan.   Meds ordered this encounter  Medications  . ondansetron (ZOFRAN-ODT) disintegrating tablet 8 mg  . cefTRIAXone (ROCEPHIN) injection 1 g  . cephALEXin (KEFLEX) 500 MG capsule    Sig: Take  1 capsule (500 mg total) by mouth 3 (three) times daily. X 14 days    Dispense:  42 capsule    Refill:  0  . phenazopyridine (PYRIDIUM) 200 MG tablet    Sig: Take 1 tablet (200 mg total) by mouth 3 (three) times daily as needed for pain.    Dispense:  6 tablet    Refill:  0  . ondansetron (ZOFRAN ODT) 8 MG disintegrating tablet    Sig: Take 1 tablet (8 mg total) by mouth every 8 (eight) hours as needed for nausea or vomiting.    Dispense:  20 tablet    Refill:  0    *This clinic note was created using [employer]. Therefore, there may be occasional mistakes despite careful proofreading.  ?   Melynda Ripple, MD 07/22/16 2109

## 2016-07-22 NOTE — ED Notes (Signed)
Dr Alphonzo Cruise aware of minimal urine ouitput

## 2016-07-22 NOTE — Discharge Instructions (Signed)
Go to the ER if you have persistent fevers despite being on the antibiotics, if you're unable to urinate, worsening back pain, worsening of your abdominal pain, pain not controlled with Tylenol, or other concerns. Take 1 g of Tylenol up to 4 times a day as needed for pain. Follow-up with Dr. Renold Genta in several days to make sure that we have you on the right course.

## 2016-07-22 NOTE — ED Notes (Signed)
Stressed importance of going to ed for vomiting, worsening symptoms, not able to hold down medicine or fluids

## 2016-07-22 NOTE — ED Notes (Signed)
Having small watery stool, last small watery stool was 4:00AM.  No relief of abdominal pain.  Patient had nausea on Monday.  Yesterday, patient had no appetite.  Nausea increased with attempts to eat.   Patient has a nagging cough for 3 weeks.  When she coughs, pain in left side increases.

## 2016-07-22 NOTE — ED Notes (Signed)
Patient able to provide a small amount of urine.  Urine to be sent.  Patient reports some blood noticed on tissue.

## 2016-07-22 NOTE — ED Notes (Signed)
Patient was unable to provide a sufficient amount of urine for the urine culture

## 2016-07-22 NOTE — ED Notes (Signed)
Family at bedside. 

## 2016-07-22 NOTE — ED Triage Notes (Signed)
The patient presented to the [facility] with a complaint of left lower abdominal pain that radiates into her left lower back. The patient reported that the pain started 2 days ago. The patient also stated that even though she denied dysuria and frequency the pain is like the pain she experienced in her last UTI.

## 2016-07-23 LAB — URINE CULTURE

## 2016-07-29 ENCOUNTER — Telehealth (HOSPITAL_COMMUNITY): Payer: Self-pay | Admitting: Emergency Medicine

## 2016-07-29 NOTE — Telephone Encounter (Signed)
Called pt and notified of recent lab results Pt ID'd properly... Reports feeling better and sx have subsided 2 days after being seen Pt wanted to know if she should continue meds given here Adv pt that it's up to her but there is no reason to take them since urine culture came back clean.  Pt reports she will d/c meds  States she was very pleased w/the service given at [facility] Adv pt if sx are not getting better to return or to f/u w/PCP Pt verb understanding.

## 2016-07-29 NOTE — Telephone Encounter (Signed)
-----   Message from Sherlene Shams, MD sent at 07/25/2016 11:21 AM EDT ----- Please let patient know that urine culture did not clearly demonstrate a UTI.   Recheck or followup with Dr Renold Genta as discussed at [facility] visit 07/22/16 for further evaluation if symptoms persist.  LM

## 2016-08-09 ENCOUNTER — Other Ambulatory Visit: Payer: Self-pay | Admitting: Cardiology

## 2016-08-19 ENCOUNTER — Encounter: Payer: Self-pay | Admitting: Internal Medicine

## 2016-08-19 ENCOUNTER — Telehealth: Payer: Self-pay | Admitting: Internal Medicine

## 2016-08-19 DIAGNOSIS — J069 Acute upper respiratory infection, unspecified: Secondary | ICD-10-CM

## 2016-08-19 MED ORDER — AZITHROMYCIN 250 MG PO TABS: ORAL_TABLET | ORAL | 0 refills | Status: DC

## 2016-08-19 NOTE — Telephone Encounter (Signed)
Patient called complaining of URI symptoms. She had similar illness October 10 and we called her in a Zithromax Z-Pak. She subsequently improved. In September, she had a fall while walking down some steps as her purse got caught. She suffered an avulsion fracture of the distal dorsal talus. She is seeing orthopedist for this.  Says she has not felt well for the past few weeks.  On October 25 she was seen at [facility] urgent care. Was thought initially to have pyelonephritis as she had an abnormal urinalysis nausea vomiting and low-grade fever. However, urine culture was unremarkable. She was treated symptomatically after receiving 1 g IM Rocephin in the urgent care. Did not pursue antibiotic treatment as culture was negative.  Advised patient we would call in Zithromax for her today because she has URI symptoms but she indicates that she wants to be seen in the near future for fatigue and malaise.

## 2016-08-24 ENCOUNTER — Other Ambulatory Visit: Payer: Medicare Other | Admitting: Internal Medicine

## 2016-08-28 ENCOUNTER — Ambulatory Visit: Payer: Medicare Other | Admitting: Internal Medicine

## 2016-09-02 ENCOUNTER — Other Ambulatory Visit: Payer: Self-pay | Admitting: Internal Medicine

## 2016-09-02 NOTE — Telephone Encounter (Signed)
Per patient she does need a refill this is not an auto refill request.  Sometimes she makes it last longer than other times.

## 2016-09-02 NOTE — Telephone Encounter (Signed)
Please call and see if pt needs this Rx or is it on auto refill?

## 2016-09-02 NOTE — Telephone Encounter (Signed)
Refill once 

## 2016-09-10 ENCOUNTER — Telehealth: Payer: Self-pay | Admitting: Internal Medicine

## 2016-09-10 ENCOUNTER — Encounter: Payer: Self-pay | Admitting: Internal Medicine

## 2016-09-10 ENCOUNTER — Ambulatory Visit (INDEPENDENT_AMBULATORY_CARE_PROVIDER_SITE_OTHER): Payer: Medicare Other | Admitting: Internal Medicine

## 2016-09-10 ENCOUNTER — Ambulatory Visit
Admission: RE | Admit: 2016-09-10 | Discharge: 2016-09-10 | Disposition: A | Discharge status: Home / Self Care | Payer: Medicare Other | Source: Ambulatory Visit | Attending: Internal Medicine | Admitting: Internal Medicine

## 2016-09-10 VITALS — BP 148/80 | HR 83 | Temp 99.4°F | Ht 65.0 in | Wt 190.0 lb

## 2016-09-10 DIAGNOSIS — R059 Cough, unspecified: Secondary | ICD-10-CM

## 2016-09-10 DIAGNOSIS — R05 Cough: Secondary | ICD-10-CM | POA: Diagnosis not present

## 2016-09-10 DIAGNOSIS — J22 Unspecified acute lower respiratory infection: Secondary | ICD-10-CM | POA: Diagnosis not present

## 2016-09-10 DIAGNOSIS — R5383 Other fatigue: Secondary | ICD-10-CM | POA: Diagnosis not present

## 2016-09-10 LAB — CBC WITH DIFFERENTIAL/PLATELET
BASOS ABS: 69 {cells}/uL (ref 0–200)
BASOS PCT: 1 %
EOS PCT: 9 %
Eosinophils Absolute: 621 cells/uL — ABNORMAL HIGH (ref 15–500)
HCT: 40.6 % (ref 35.0–45.0)
HEMOGLOBIN: 13.7 g/dL (ref 11.7–15.5)
LYMPHS ABS: 2829 {cells}/uL (ref 850–3900)
Lymphocytes Relative: 41 %
MCH: 30 pg (ref 27.0–33.0)
MCHC: 33.7 g/dL (ref 32.0–36.0)
MCV: 89 fL (ref 80.0–100.0)
MONOS PCT: 7 %
MPV: 9.4 fL (ref 7.5–12.5)
Monocytes Absolute: 483 cells/uL (ref 200–950)
NEUTROS ABS: 2898 {cells}/uL (ref 1500–7800)
Neutrophils Relative %: 42 %
PLATELETS: 330 10*3/uL (ref 140–400)
RBC: 4.56 MIL/uL (ref 3.80–5.10)
RDW: 13.6 % (ref 11.0–15.0)
WBC: 6.9 10*3/uL (ref 3.8–10.8)

## 2016-09-10 LAB — POCT URINALYSIS DIPSTICK
BILIRUBIN UA: NEGATIVE
GLUCOSE UA: NEGATIVE
KETONES UA: NEGATIVE
Nitrite, UA: NEGATIVE
RBC UA: NEGATIVE
SPEC GRAV UA: 1.015
UROBILINOGEN UA: NEGATIVE
pH, UA: 6

## 2016-09-10 LAB — COMPLETE METABOLIC PANEL WITH GFR
ALT: 26 U/L (ref 6–29)
AST: 23 U/L (ref 10–35)
Albumin: 4.4 g/dL (ref 3.6–5.1)
Alkaline Phosphatase: 42 U/L (ref 33–130)
BILIRUBIN TOTAL: 0.7 mg/dL (ref 0.2–1.2)
BUN: 13 mg/dL (ref 7–25)
CHLORIDE: 104 mmol/L (ref 98–110)
CO2: 28 mmol/L (ref 20–31)
CREATININE: 0.88 mg/dL (ref 0.50–0.99)
Calcium: 9.6 mg/dL (ref 8.6–10.4)
GFR, Est African American: 80 mL/min (ref >=60)
GFR, Est Non African American: 69 mL/min (ref >=60)
GLUCOSE: 99 mg/dL (ref 65–99)
Potassium: 3.8 mmol/L (ref 3.5–5.3)
SODIUM: 141 mmol/L (ref 135–146)
TOTAL PROTEIN: 6.9 g/dL (ref 6.1–8.1)

## 2016-09-10 LAB — TSH: TSH: 1.35 m[IU]/L

## 2016-09-10 MED ORDER — PREDNISONE 10 MG (21) PO TBPK: ORAL_TABLET | ORAL | 0 refills | Status: DC

## 2016-09-10 MED ORDER — LEVOFLOXACIN 500 MG PO TABS: 500.0000 mg | ORAL_TABLET | Freq: Every day | ORAL | 0 refills | Status: DC

## 2016-09-10 MED ORDER — HYDROCODONE-HOMATROPINE 5-1.5 MG/5ML PO SYRP: 5.0000 mL | ORAL_SOLUTION | Freq: Three times a day (TID) | ORAL | 0 refills | Status: DC | PRN

## 2016-09-10 MED ORDER — CEFTRIAXONE SODIUM 1 G IJ SOLR
1.0000 g | Freq: Once | INTRAMUSCULAR | Status: AC
  Administered 2016-09-10: 1 g via INTRAMUSCULAR

## 2016-09-10 NOTE — Progress Notes (Signed)
   Subjective:    Patient ID: Olivia Reynolds, female    DOB: 1951/02/18, 65 y.o.   MRN: [id number]  HPI Patient suffered  Had a  fall September 21 with avulsion fracture of talus. Persistent respiratory symptoms with cough and wheezing. Had right shoulder surgery rotator cuff repair  July 2017also. Has had protracted and recurrent URI symptoms. In October had hematuria, but  culture had multiple species. Had pain left groin and now wonders if she passed a kidney stone. Has some sputum production and post nasal drip. Generally has just not felt well. Has been fatigued and may be a bit depressed. Has frequent headaches.  Had preop exam here in July.    Review of Systems  Constitutional: Positive for fatigue.  HENT: Positive for postnasal drip.   Respiratory: Positive for cough, shortness of breath and wheezing.   Neurological:       Headaches       Objective:   Physical Exam  Constitutional: She appears well-developed and well-nourished. No distress.  HENT:  Head: Normocephalic and atraumatic.  Right Ear: External ear normal.  Left Ear: External ear normal.  Mouth/Throat: Oropharynx is clear and moist.  Neck: No JVD present. No tracheal deviation present.  Cardiovascular: Normal rate, regular rhythm, normal heart sounds and intact distal pulses.   No murmur heard. Pulmonary/Chest: Breath sounds normal. She has no wheezes. She has no rales.  Abdominal: Soft. There is no tenderness. There is no rebound.  Musculoskeletal: She exhibits no edema.  Neurological: She is alert. She has normal reflexes.  Skin: Skin is warm and dry. She is not diaphoretic.  Psychiatric: She has a normal mood and affect. Her behavior is normal. Judgment and thought content normal.  Vitals reviewed.         Assessment & Plan:  Acute bronchitis  Fatigue  History of recurrent migraine headaches  Plan: Prednisone 10 mg 6 day dosepak take in tapering course as directed. Patient had chest x-ray.  Levaquin 500 milligrams daily for 10 days. Hycodan 1 teaspoon by mouth every 6 hours when necessary cough. Continue Zoloft for depression. Patient not taking Crestor at the present time. Valium was refilled recently for sleep.  Small LE on urine specimen. Culture sent. CBC with differential normal. TSH normal. C met normal.  Addendum: Urine culture has multiple species. Chest x-ray is normal.

## 2016-09-10 NOTE — Telephone Encounter (Signed)
-----   Message from Elby Showers, MD sent at 09/10/2016  4:52 PM EST ----- Please call pt. No pneumonia.

## 2016-09-10 NOTE — Telephone Encounter (Signed)
Patient informed of chest xray.

## 2016-09-11 ENCOUNTER — Telehealth: Payer: Self-pay | Admitting: Internal Medicine

## 2016-09-11 LAB — URINE CULTURE

## 2016-09-11 NOTE — Telephone Encounter (Signed)
-----   Message from Elby Showers, MD sent at 09/11/2016 11:04 AM EST ----- Thyroid. Electrolytes, metabolic panel OK. Needs to watch carbs for history of elevated serum glucose. Call if not better in 2 weeks.

## 2016-09-11 NOTE — Telephone Encounter (Signed)
Left message informing patient of lab results and follow up recommendations.

## 2016-09-22 ENCOUNTER — Other Ambulatory Visit: Payer: Self-pay | Admitting: Internal Medicine

## 2016-09-22 NOTE — Telephone Encounter (Signed)
Refill once 

## 2016-09-23 ENCOUNTER — Other Ambulatory Visit: Payer: Self-pay | Admitting: Internal Medicine

## 2016-09-23 NOTE — Telephone Encounter (Signed)
FIORICET was called into [facility] on [address].

## 2016-09-24 DIAGNOSIS — S46011A Strain of muscle(s) and tendon(s) of the rotator cuff of right shoulder, initial encounter: Secondary | ICD-10-CM | POA: Diagnosis not present

## 2016-09-25 ENCOUNTER — Telehealth: Payer: Self-pay | Admitting: Internal Medicine

## 2016-09-25 ENCOUNTER — Encounter: Payer: Self-pay | Admitting: Internal Medicine

## 2016-09-25 NOTE — Telephone Encounter (Signed)
Form completed and faxed to [facility] for approval of Fioricet for migraine headaches

## 2016-10-02 ENCOUNTER — Telehealth: Payer: Self-pay | Admitting: Internal Medicine

## 2016-10-02 DIAGNOSIS — Z8669 Personal history of other diseases of the nervous system and sense organs: Secondary | ICD-10-CM

## 2016-10-02 NOTE — Telephone Encounter (Signed)
Fioricet was denied by insurance.  Per Dr Renold Genta patient should be referred to neurology.  Order placed.

## 2016-10-12 ENCOUNTER — Other Ambulatory Visit: Payer: Medicare Other | Admitting: Internal Medicine

## 2016-10-16 ENCOUNTER — Ambulatory Visit: Payer: Medicare Other | Admitting: Internal Medicine

## 2016-10-28 NOTE — Patient Instructions (Signed)
Levaquin 500 milligrams daily for 10 days. Prednisone dosepak taper is to rate it. Hycodan 1 teaspoon by mouth every 6 hours. Cough. Lab work pending. Have chest x-ray.

## 2016-11-02 ENCOUNTER — Other Ambulatory Visit: Payer: Self-pay | Admitting: Internal Medicine

## 2016-11-03 NOTE — Telephone Encounter (Signed)
Refill once 

## 2016-11-26 ENCOUNTER — Telehealth: Payer: Self-pay | Admitting: Physician Assistant

## 2016-11-26 NOTE — Telephone Encounter (Signed)
Advised pt unsure of why someone called but it could be to schedule appt with Dr. Stanford Breed as recall shows 09/2016.  While attempting to schedule pt she mentioned that since Sept she has noticed she is more fatigued and has more DOE.  Pt states when she takes a shower she typically has to get out early because she becomes dizzy and SOB.  On occasion has felt like she was going to faint but hasn't actually passed out.  No recent vitals.  Pt unable to come to open appts with Dr. Stanford Breed next week.  Advised I would send a message to Dr. Jacalyn Lefevre nurse to see if she is able to schedule pt for an appt prior to May availability.  Pt appreciative for assistance.

## 2016-11-26 NOTE — Telephone Encounter (Signed)
Spoke with pt, Follow up scheduled with APP 

## 2016-11-26 NOTE — Telephone Encounter (Signed)
New message    Pt received a phone call and no message was left and she is unsure why

## 2016-12-02 ENCOUNTER — Encounter: Payer: Self-pay | Admitting: Nurse Practitioner

## 2016-12-02 ENCOUNTER — Ambulatory Visit (INDEPENDENT_AMBULATORY_CARE_PROVIDER_SITE_OTHER): Payer: Medicare Other | Admitting: Nurse Practitioner

## 2016-12-02 VITALS — BP 152/68 | HR 91 | Ht 65.0 in | Wt 194.6 lb

## 2016-12-02 DIAGNOSIS — I10 Essential (primary) hypertension: Secondary | ICD-10-CM | POA: Diagnosis not present

## 2016-12-02 DIAGNOSIS — I480 Paroxysmal atrial fibrillation: Secondary | ICD-10-CM | POA: Diagnosis not present

## 2016-12-02 DIAGNOSIS — R5383 Other fatigue: Secondary | ICD-10-CM

## 2016-12-02 DIAGNOSIS — R0609 Other forms of dyspnea: Secondary | ICD-10-CM

## 2016-12-02 MED ORDER — METOPROLOL SUCCINATE ER 50 MG PO TB24
25.0000 mg | ORAL_TABLET | Freq: Every day | ORAL | 10 refills | Status: DC
Start: 2016-12-02 — End: 2016-12-02

## 2016-12-02 MED ORDER — METOPROLOL SUCCINATE ER 25 MG PO TB24: 25.0000 mg | ORAL_TABLET | Freq: Every day | ORAL | 11 refills | Status: DC

## 2016-12-02 MED ORDER — DILTIAZEM HCL ER COATED BEADS 240 MG PO CP24: 240.0000 mg | ORAL_CAPSULE | Freq: Every day | ORAL | 11 refills | Status: DC

## 2016-12-02 NOTE — Patient Instructions (Signed)
Medication Instructions:  INCREASE DILTIAZEM TO 240MG  DAILY-START TOMORROW DECREASE YOUR METOPROLOL TO 25MG  DAILY  If you need a refill on your cardiac medications before your next appointment, please call your pharmacy.  Testing/Procedures: Your physician has requested that you have an echocardiogram. Echocardiography is a painless test that uses sound waves to create images of your heart. It provides your doctor with information about the size and shape of your heart and how well your heart's chambers and valves are working. This procedure takes approximately one hour. There are no restrictions for this procedure.   Follow-Up: Your physician wants you to follow-up in: [place] [facility]) You will receive a reminder letter in the mail two months in advance. If you don't receive a letter, please call our office JULY to schedule the [facility] follow-up appointment.  Thank you for choosing [facility] at [facility]!!    [place], NP Sharyn Lull, [place]

## 2016-12-02 NOTE — Progress Notes (Signed)
Office Visit    Patient Name: Olivia Reynolds Date of Encounter: 12/02/2016  Primary Care Provider:  Elby Showers, MD Primary Cardiologist:  B. Stanford Breed, MD   Chief Complaint    66 year old female with a prior history of paroxysmal atrial fibrillation, hypertension, hyperlipidemia, GERD, and obesity, who presents for follow-up related to dyspnea on exertion.  Past Medical History    Past Medical History:  Diagnosis Date  . Chronic headaches   . Depression   . Diastolic dysfunction    a. 12/2013 Echo: EF 60-65%, Gr1 DD, Ao sclerosis w/o stenosis, triv MR, mod dil LA, mild TR.  . Diverticulosis   . GERD (gastroesophageal reflux disease)   . History of hiatal hernia    with repair  . History of stress test    a. 10/2008 MV: Ef 75%, nl perfusion.  Marland Kitchen HTN (hypertension)   . Hyperlipidemia   . Obesity   . PAF (paroxysmal atrial fibrillation) ([place])    a. Anticoagulated w/ Eliquis (CHA2DS2VASc = 3).   Past Surgical History:  Procedure Laterality Date  . COLONOSCOPY    . ELBOW SURGERY  left   Tendon Surgery  . EYE SURGERY     L-yey myopia  . HERNIA REPAIR  3875   nissan fundiplication  . KNEE ARTHROSCOPY  2005   R-knee  . LAPAROSCOPIC NISSEN FUNDOPLICATION    . TONSILLECTOMY    . TUBAL LIGATION    . UPPER GASTROINTESTINAL ENDOSCOPY      Allergies  Allergies  Allergen Reactions  . Codeine Hives  . Erythromycin Hives  . Statins     Lipitor  . Hctz [Hydrochlorothiazide] Rash    History of Present Illness    66 year old female with prior history of paroxysmal atrial fibrillation on chronic eliquis therapy, diastolic dysfunction, hypertension, hyperlipidemia, obesity, GERD, hiatal hernia, and fatigue. She has had fatigue for a few years and in 2016, when she last saw Dr. Stanford Breed, an attempt was made to wean her off of beta blocker therapy but once she was off of it, she was having recurrent paroxysms of A. fib and she went back on beta blocker. She is currently on  diltiazem 180 mg daily and metoprolol 50 mg daily. She continues to have some level of chronic fatigue. Over the past few months, she has also noticed some dyspnea on exertion. She has not been having chest pain, PND, orthopnea, dizziness, syncope, edema, or early satiety. Over the years, she has occasionally noted palpitations which she associates with atrial fibrillation, lasting up to 10 minutes, and resolving spontaneous. This only occurs 2 or 3 times a month and overall she is not particularly bothersome.  Home Medications    Prior to Admission medications   Medication Sig Start Date End Date Taking? Authorizing Provider  acetaminophen (TYLENOL) 500 MG tablet Take 500 mg by mouth every 6 (six) hours as needed.   Yes Historical Provider, MD  diltiazem (CARDIZEM CD) 240 MG 24 hr capsule Take 1 capsule (240 mg total) by mouth daily. 12/02/16  Yes Rogelia Mire, NP  ELIQUIS 5 MG TABS tablet TAKE 1 TABLET BY MOUTH TWICE DAILY 08/10/16  Yes Lelon Perla, MD  metoprolol succinate (TOPROL-XL) 25 MG 24 hr tablet Take 1 tablet (25 mg total) by mouth daily. Take with or immediately following a meal. 12/02/16  Yes Rogelia Mire, NP  Polyethyl Glycol-Propyl Glycol (SYSTANE OP) Apply 1 drop to eye daily as needed (for sry eyes).   Yes Historical  Provider, MD  rosuvastatin (CRESTOR) 10 MG tablet Take 1 tablet (10 mg total) by mouth daily. 04/09/16  Yes Lelon Perla, MD  sertraline (ZOLOFT) 50 MG tablet Take 1 tablet (50 mg total) by mouth daily. 04/09/16  Yes Lelon Perla, MD    Review of Systems    As above, she has somewhat chronic fatigue possibly related to beta blocker therapy. She has also been having some dyspnea on exertion without chest pain, PND, orthopnea, dizziness, syncope, edema, or early satiety.  She does have occasional palpitations, maybe about 3 times a month, lasting a few minutes, and resolving spontaneously. She attributes this to paroxysmal atrial fibrillation. All  other systems reviewed and are otherwise negative except as noted above.  Physical Exam    VS:  BP (!) 152/68   Pulse 91   Ht 5\' 5"  (1.651 m)   Wt 194 lb 9.6 oz (88.3 kg)   BMI 32.38 kg/m  , BMI Body mass index is 32.38 kg/m. GEN: Well nourished, well developed, in no acute distress.  HEENT: normal.  Neck: Supple, no JVD, carotid bruits, or masses. Cardiac: RRR, no murmurs, rubs, or gallops. No clubbing, cyanosis, edema.  Radials/DP/PT 2+ and equal bilaterally.  Respiratory:  Respirations regular and unlabored, clear to auscultation bilaterally. GI: Soft, nontender, nondistended, BS + x 4. MS: no deformity or atrophy. Skin: warm and dry, no rash. Neuro:  Strength and sensation are intact. Psych: Normal affect.  Accessory Clinical Findings    ECG - Regular sinus rhythm, 91, rightward axis, nonspecific T changes.  Assessment & Plan    1.  Dyspnea on exertion/diastolic dysfunction: Patient has been having some dyspnea exertion over the past few months. Her volume status is stable today. Last echo was in 2015 and I will arrange for follow-up. Deconditioning may be playing a role.  2. Paroxysmal atrial fibrillation: Overall, she is doing well. She notes paroxysms a few times a month but these are short-lived and not particularly bothersome. When they occur, they typically resolve spontaneously but sometimes she will take an extra dose of beta blocker. With regards to beta blocker, this was previously weaned off and diltiazem was titrated upward however, she noted increased paroxysms when this was attempted. She actually doesn't remember this however phone notes documented fairly clearly from 2016. She remains fatigued and she does feel like it's lifestyle limiting. I advised that we can instead of discontinuing beta blocker altogether, see if she will tolerate 25 mg daily and also increase her diltiazem to 240 mg daily. She would like to try this. She understands that if ultimately she  wished to come off of beta blocker therapy, we would likely need to consider antiarrhythmic therapy. She remains anticoagulated with eliquis.  She just had a CBC and basic metabolic panel in December, which were stable.  3.  Fatigue:  Ongoing for several years.  ? Relationship to  blocker.  She will reduce dose to 25 mg daily but plan to continue as she had increased AF off of  blocker in the past.  4.  HTN:  BP up today.  She says that that is unusual for her - usually 120/80.  Increasing dilt but decreasing  blocker.  She will cont to follow @ home.  5.  Dispo: f/u echo.  F/u with Thresa Ross, MD in 6 mos or sooner if necessary.  She will call if she has more AF on lower dose of  blocker/higher dose of dilt.  Murray Hodgkins,  NP 12/02/2016, 5:50 PM

## 2016-12-17 ENCOUNTER — Other Ambulatory Visit: Payer: Self-pay | Admitting: Internal Medicine

## 2016-12-18 ENCOUNTER — Ambulatory Visit (HOSPITAL_COMMUNITY): Payer: Medicare Other

## 2016-12-18 ENCOUNTER — Telehealth: Payer: Self-pay | Admitting: Cardiology

## 2016-12-18 NOTE — Telephone Encounter (Signed)
Patient aware of recommendations.  

## 2016-12-18 NOTE — Telephone Encounter (Signed)
New message    Pt c/o medication issue:  1. Name of Medication: metoprolol  2. How are you currently taking this medication (dosage and times per day)? Pt no longer takes this medication  3. Are you having a reaction (difficulty breathing--STAT)?   4. What is your medication issue? Pt states she was taken off this medication 2 weeks ago and has had afib every other day since then.

## 2016-12-18 NOTE — Telephone Encounter (Signed)
Returned call to patient-patient states since her  OV with NP she has been having intermittent palpitations where she thinks she is in Afib (hx of PAF).  Reports that her metoprolol was stopped last OV.   Per chart review:   Medication Instructions:  INCREASE DILTIAZEM TO 240MG  DAILY-START TOMORROW DECREASE YOUR METOPROLOL TO 25MG  DAILY   Patient was not aware she was suppose to continue metoprolol.  Patient reports taking correct dose of cardizem.  Denies SOB, CP, dizziness, lightheadedness.  Reports fatigue but appears to be a chronic issue.    Patient does not have BP readings or pulse readings since appointment as her machine broke.    Patient took pulse rate for 1 minute on phone-62 bpm.  Patient apologized for the misunderstanding, apologized for this as well.  Patient took dose of metoprolol 25mg  while on phone with RN and will continue as directed.    Advised to restart this medication and see if symptoms improve.  If continue or increase aware to call back as we have a 24/7 line.  Will route to Ignacia Bayley NP for further recommendations.

## 2016-12-18 NOTE — Telephone Encounter (Signed)
We discussed weaning off of  blocker b/c of somewhat chronic fatigue, so she may have reduced and then d/c'd based on that conversation.  Agree with resuming at least 25 mg of metoprolol daily - previously on 50.  If paroxysms persist/worsen, she will need f/u to discuss possibly initiation of an antiarrhythmic.

## 2016-12-18 NOTE — Telephone Encounter (Signed)
Refill x 30 days 

## 2017-01-01 ENCOUNTER — Other Ambulatory Visit: Payer: Self-pay

## 2017-01-01 ENCOUNTER — Encounter (INDEPENDENT_AMBULATORY_CARE_PROVIDER_SITE_OTHER): Payer: Self-pay

## 2017-01-01 ENCOUNTER — Ambulatory Visit (HOSPITAL_COMMUNITY): Payer: Medicare Other | Attending: Internal Medicine

## 2017-01-01 DIAGNOSIS — I501 Left ventricular failure: Secondary | ICD-10-CM | POA: Insufficient documentation

## 2017-01-01 DIAGNOSIS — I361 Nonrheumatic tricuspid (valve) insufficiency: Secondary | ICD-10-CM | POA: Diagnosis not present

## 2017-01-01 DIAGNOSIS — I340 Nonrheumatic mitral (valve) insufficiency: Secondary | ICD-10-CM | POA: Diagnosis not present

## 2017-01-01 DIAGNOSIS — R0609 Other forms of dyspnea: Secondary | ICD-10-CM

## 2017-01-01 DIAGNOSIS — I351 Nonrheumatic aortic (valve) insufficiency: Secondary | ICD-10-CM | POA: Insufficient documentation

## 2017-01-01 LAB — ECHOCARDIOGRAM COMPLETE
AVLVOTPG: 6 mmHg
AVPHT: 361 ms
CHL CUP MV DEC (S): 201
E decel time: 201 msec
E/e' ratio: 11.21
FS: 48 % — AB (ref 28–44)
IVS/LV PW RATIO, ED: 0.98
LA ID, A-P, ES: 34 mm
LA diam index: 1.73 cm/m2
LA vol A4C: 42 ml
LA vol: 41 mL
LAVOLIN: 20.9 mL/m2
LDCA: 2.84 cm2
LEFT ATRIUM END SYS DIAM: 34 mm
LV E/e'average: 11.21
LV TDI E'MEDIAL: 5.15
LVEEMED: 11.21
LVELAT: 4.61 cm/s
LVOT SV: 72 mL
LVOT VTI: 25.4 cm
LVOT peak vel: 124 cm/s
LVOTD: 19 mm
MV pk E vel: 51.7 m/s
MVPKAVEL: 75.6 m/s
PW: 11.3 mm — AB (ref 0.6–1.1)
Reg peak vel: 246 cm/s
TDI e' lateral: 4.61
TR max vel: 246 cm/s

## 2017-01-04 ENCOUNTER — Telehealth: Payer: Self-pay | Admitting: *Deleted

## 2017-01-04 NOTE — Telephone Encounter (Signed)
-----   Message from Rogelia Mire, NP sent at 01/04/2017  7:45 AM EDT ----- Normal LV fxn.  Heart is a little stiff, which isn't new.  Keeping bp under good control remains important.  No significant valve issues.  Overall a reassuring study and no significant change since 2015.

## 2017-01-04 NOTE — Telephone Encounter (Signed)
Left msg at [phone] (mobile) to call.

## 2017-01-05 NOTE — Telephone Encounter (Signed)
Received call from patient-aware of results and recommendations.

## 2017-01-08 DIAGNOSIS — M25511 Pain in right shoulder: Secondary | ICD-10-CM | POA: Diagnosis not present

## 2017-01-08 DIAGNOSIS — M5136 Other intervertebral disc degeneration, lumbar region: Secondary | ICD-10-CM | POA: Diagnosis not present

## 2017-01-08 DIAGNOSIS — M7041 Prepatellar bursitis, right knee: Secondary | ICD-10-CM | POA: Diagnosis not present

## 2017-02-01 ENCOUNTER — Encounter: Payer: Self-pay | Admitting: Internal Medicine

## 2017-02-01 DIAGNOSIS — M8589 Other specified disorders of bone density and structure, multiple sites: Secondary | ICD-10-CM | POA: Diagnosis not present

## 2017-02-01 DIAGNOSIS — Z1231 Encounter for screening mammogram for malignant neoplasm of breast: Secondary | ICD-10-CM | POA: Diagnosis not present

## 2017-02-10 ENCOUNTER — Encounter: Payer: Self-pay | Admitting: Gynecology

## 2017-02-12 ENCOUNTER — Telehealth: Payer: Self-pay | Admitting: Internal Medicine

## 2017-02-12 NOTE — Telephone Encounter (Signed)
Spoke with patient and asked about taking Boniva once a month due to Osteopenia due to results of her recent Bone Density Test on 02/01/17 @ [place].  Patient wants to know is it unique from over the last 10 years?  States that she has had Osteopenia for the last 10 years.  And, she realizes she broke the Left ankle bone recently.  However, other than that, she hasn't had any broken bones.  States that she had rather not take any additional medication.  States that she stopped Diazepam at night because it seems to have carried over into the morning for her.  She's just curious about this medication as to whether it is absolutely necessary or if this is something that can be put off.

## 2017-02-13 NOTE — Telephone Encounter (Signed)
Bone density study in 2015 showed lowest T score -1.9 and now is about the same. The difference is that she had a fracture when she fell so many docs would suggest Boniva once a month but it is up to her. In either case, repeat study in 2 years. Bone density study is consistent with osteopenia which is not as severe as osteoporosis.

## 2017-02-15 NOTE — Telephone Encounter (Signed)
LMTCB

## 2017-02-15 NOTE — Telephone Encounter (Signed)
Pt will call back after talking to pharmacist.

## 2017-02-15 NOTE — Telephone Encounter (Signed)
Patient stated that she is only 800 dollars away from meeting her Medicare prescription supplement. She is afraid that if she agrees to takes this medication that it will put her in the hole to pay out of pocket for prescriptions and she can not do that. Stated if the RX was not expensive she would take it, please advise.

## 2017-02-15 NOTE — Telephone Encounter (Signed)
Do not know cost. She could call pharmacy and ask what it would be with her plan for a month of Boniva 150 mg once a month.

## 2017-02-17 ENCOUNTER — Other Ambulatory Visit: Payer: Self-pay | Admitting: Internal Medicine

## 2017-02-18 NOTE — Telephone Encounter (Signed)
Call in #30 with one refill

## 2017-03-15 DIAGNOSIS — H25013 Cortical age-related cataract, bilateral: Secondary | ICD-10-CM | POA: Diagnosis not present

## 2017-03-15 DIAGNOSIS — H2513 Age-related nuclear cataract, bilateral: Secondary | ICD-10-CM | POA: Diagnosis not present

## 2017-03-15 DIAGNOSIS — H524 Presbyopia: Secondary | ICD-10-CM | POA: Diagnosis not present

## 2017-04-07 DIAGNOSIS — H25011 Cortical age-related cataract, right eye: Secondary | ICD-10-CM | POA: Diagnosis not present

## 2017-04-07 DIAGNOSIS — H2512 Age-related nuclear cataract, left eye: Secondary | ICD-10-CM | POA: Diagnosis not present

## 2017-04-07 DIAGNOSIS — H25012 Cortical age-related cataract, left eye: Secondary | ICD-10-CM | POA: Diagnosis not present

## 2017-04-07 DIAGNOSIS — H2511 Age-related nuclear cataract, right eye: Secondary | ICD-10-CM | POA: Diagnosis not present

## 2017-04-14 DIAGNOSIS — H2512 Age-related nuclear cataract, left eye: Secondary | ICD-10-CM | POA: Diagnosis not present

## 2017-04-14 DIAGNOSIS — H25012 Cortical age-related cataract, left eye: Secondary | ICD-10-CM | POA: Diagnosis not present

## 2017-05-01 DIAGNOSIS — H2000 Unspecified acute and subacute iridocyclitis: Secondary | ICD-10-CM | POA: Diagnosis not present

## 2017-05-07 ENCOUNTER — Telehealth: Payer: Self-pay | Admitting: Internal Medicine

## 2017-05-07 NOTE — Telephone Encounter (Signed)
Reviewed with pt that she needed to take small bites and chew her food really well with plenty of liquids in between bites. Also discussed adding ensure, boost, puddings and yogurts. Pt asked about any meds, discussed with her that prilosec and nexium are available otc. Pt was also offered an appt with Dr. Carlean Purl for 05/11/17 but she states she has a dentist appt. That day.

## 2017-05-14 ENCOUNTER — Other Ambulatory Visit: Payer: Self-pay | Admitting: Cardiology

## 2017-05-16 IMAGING — CR DG ANKLE COMPLETE 3+V*R*
3 series · 3 of 3 positions shown · non-contrast
Comparison: None.

CLINICAL DATA: Fell downstairs tonight.

EXAM:
RIGHT ANKLE - COMPLETE 3+ VIEW

[x ankle ap right]
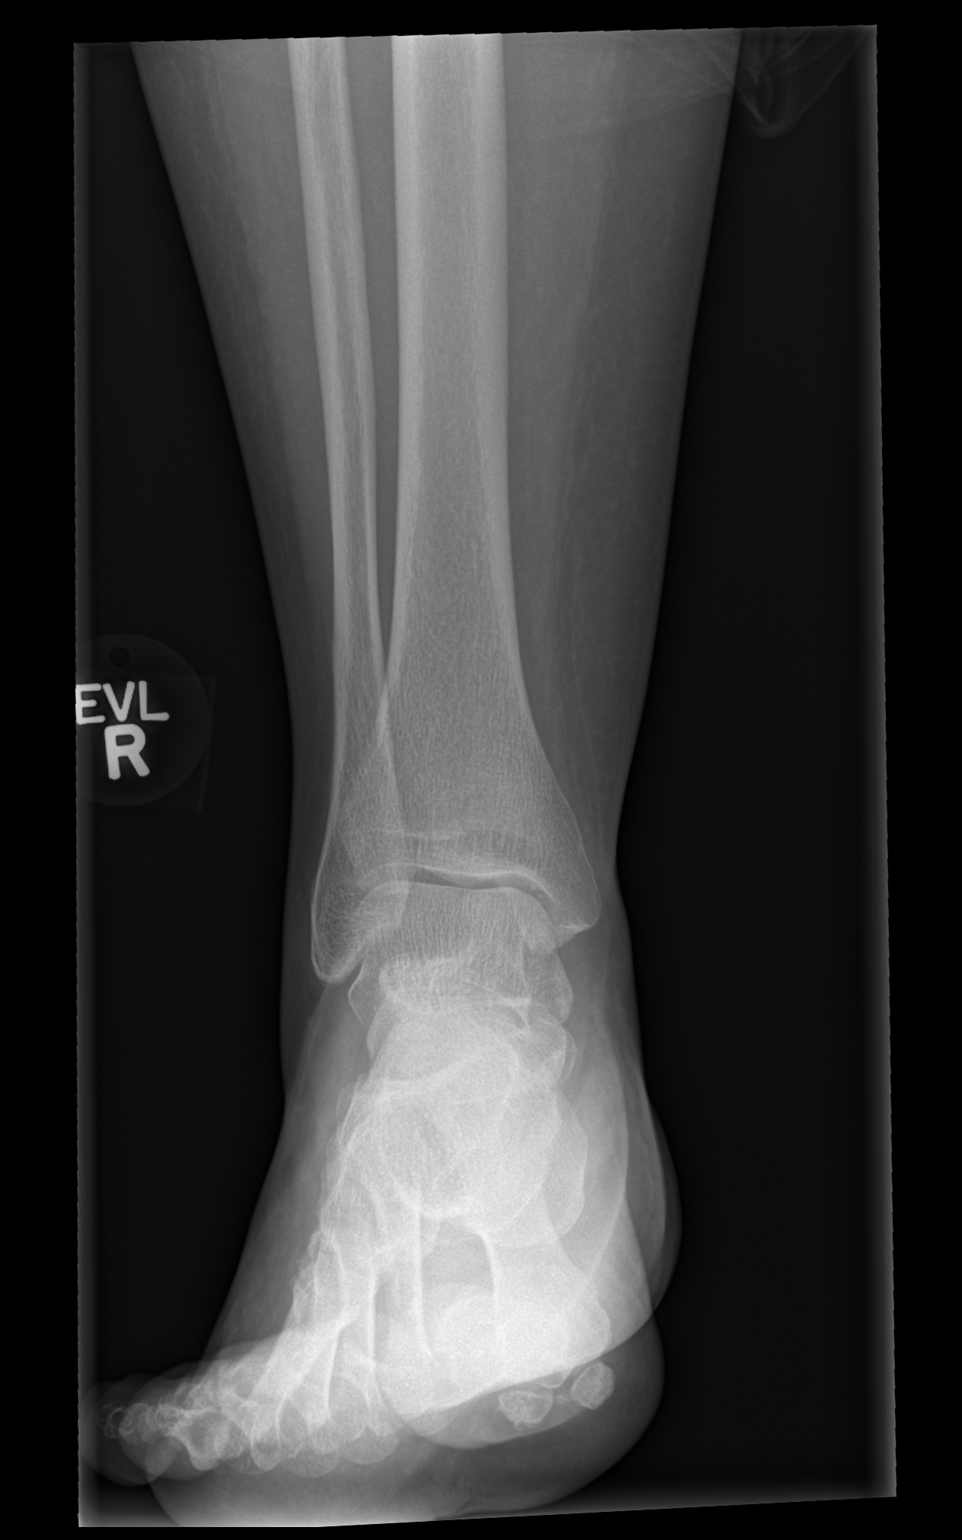

[x ankle obl right]
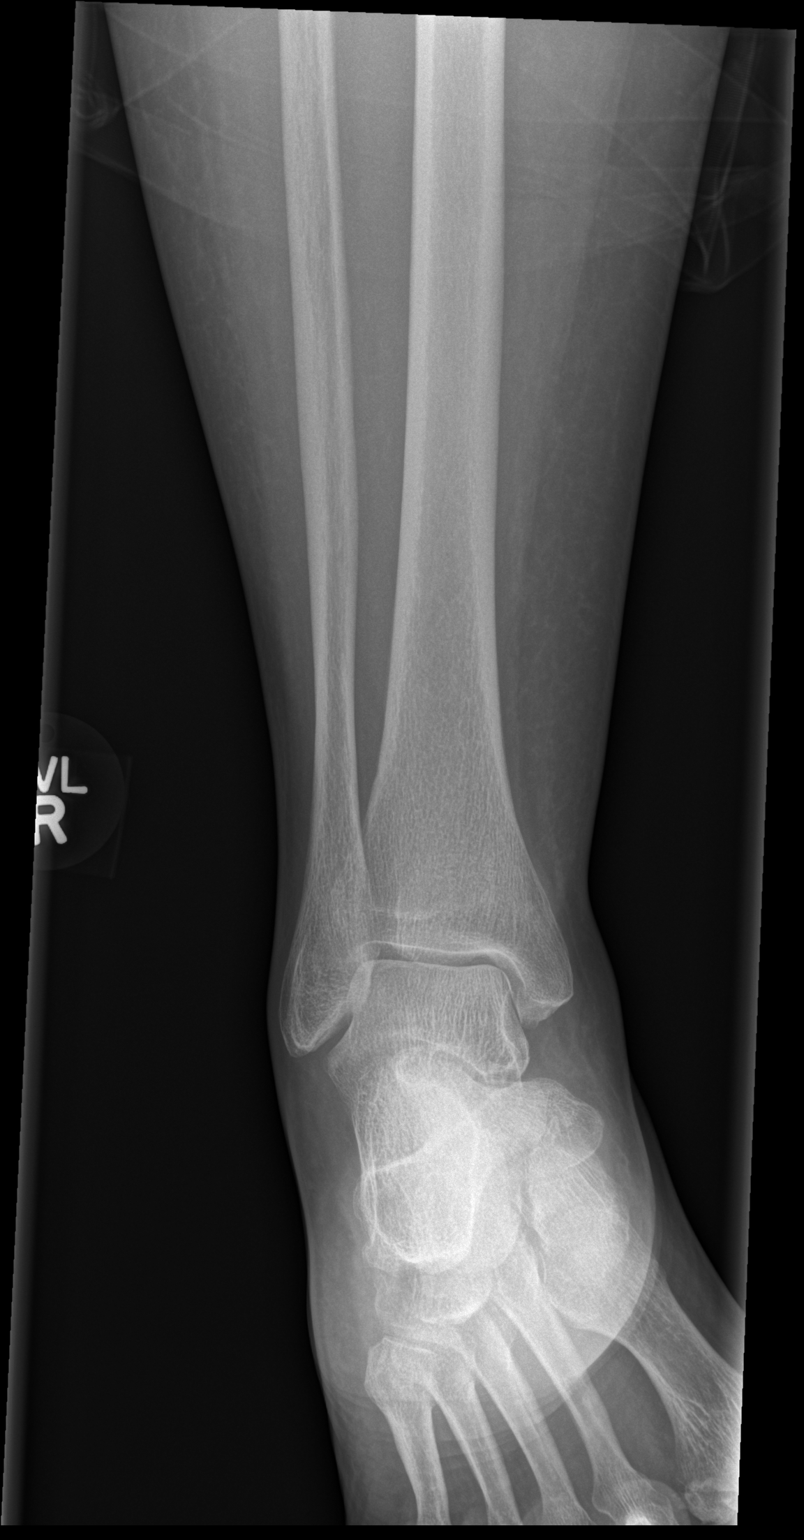

[x ankle lat right]
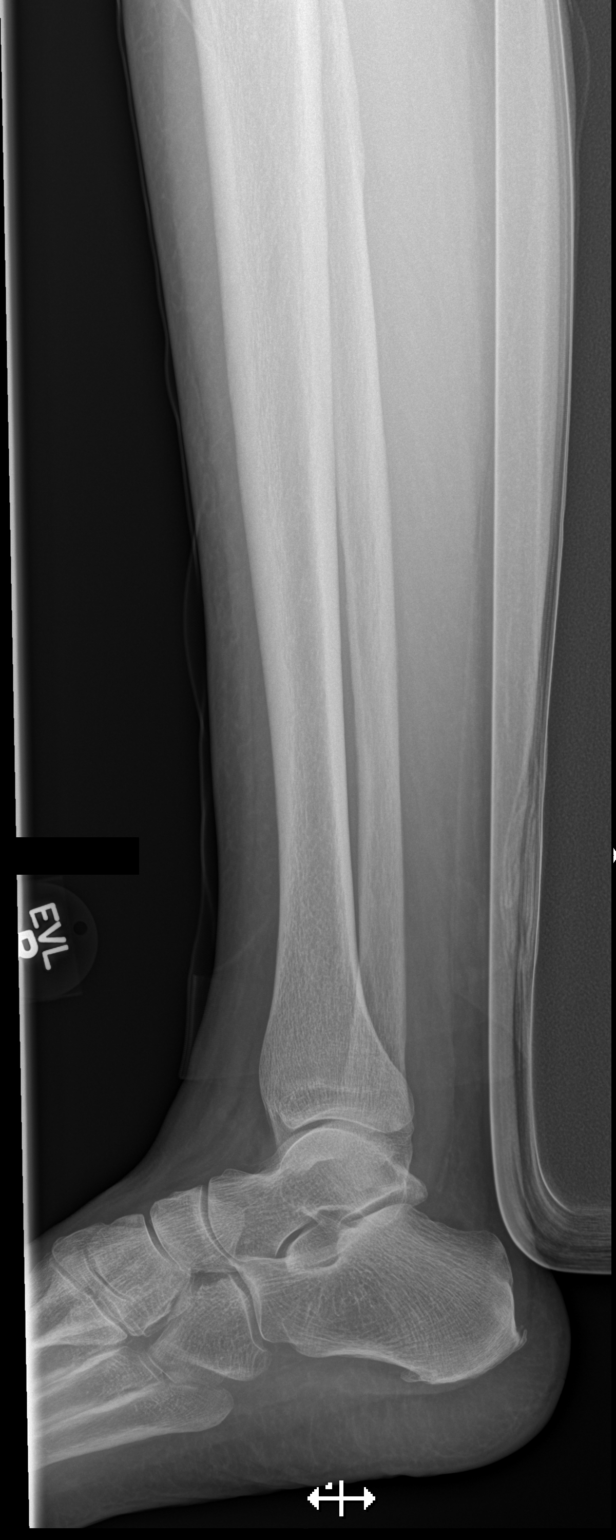

[3 of 3 positions shown; findings below may reference images not displayed]

FINDINGS: The ankle mortise is maintained. No acute ankle fracture or
osteochondral lesion. No joint effusion. The mid and hindfoot bony
structures are intact.
IMPRESSION: No acute fracture.

## 2017-05-16 IMAGING — CR DG SHOULDER 2+V*R*
3 series · 3 of 3 positions shown · non-contrast
Comparison: MRI right shoulder 03/24/2016

CLINICAL DATA: Right anterior shoulder pain. Shoulder surgery 8
weeks ago.

EXAM:
RIGHT SHOULDER - 2+ VIEW

[x shoulder axillary right (1 of 2)]
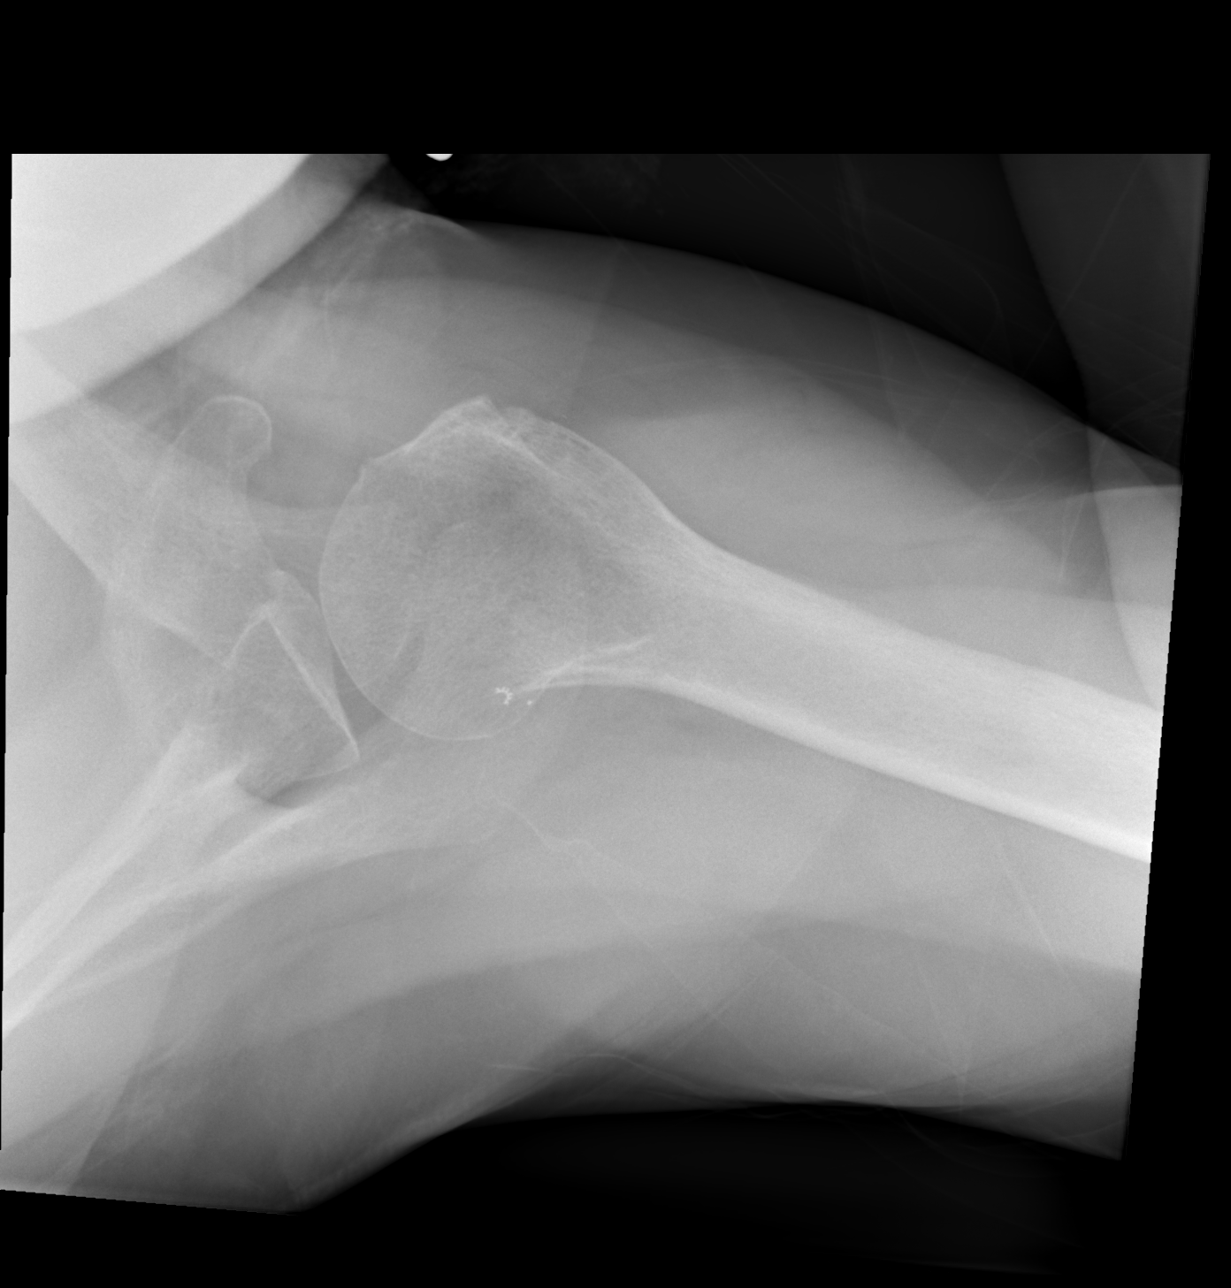

[x shoulder ap right]
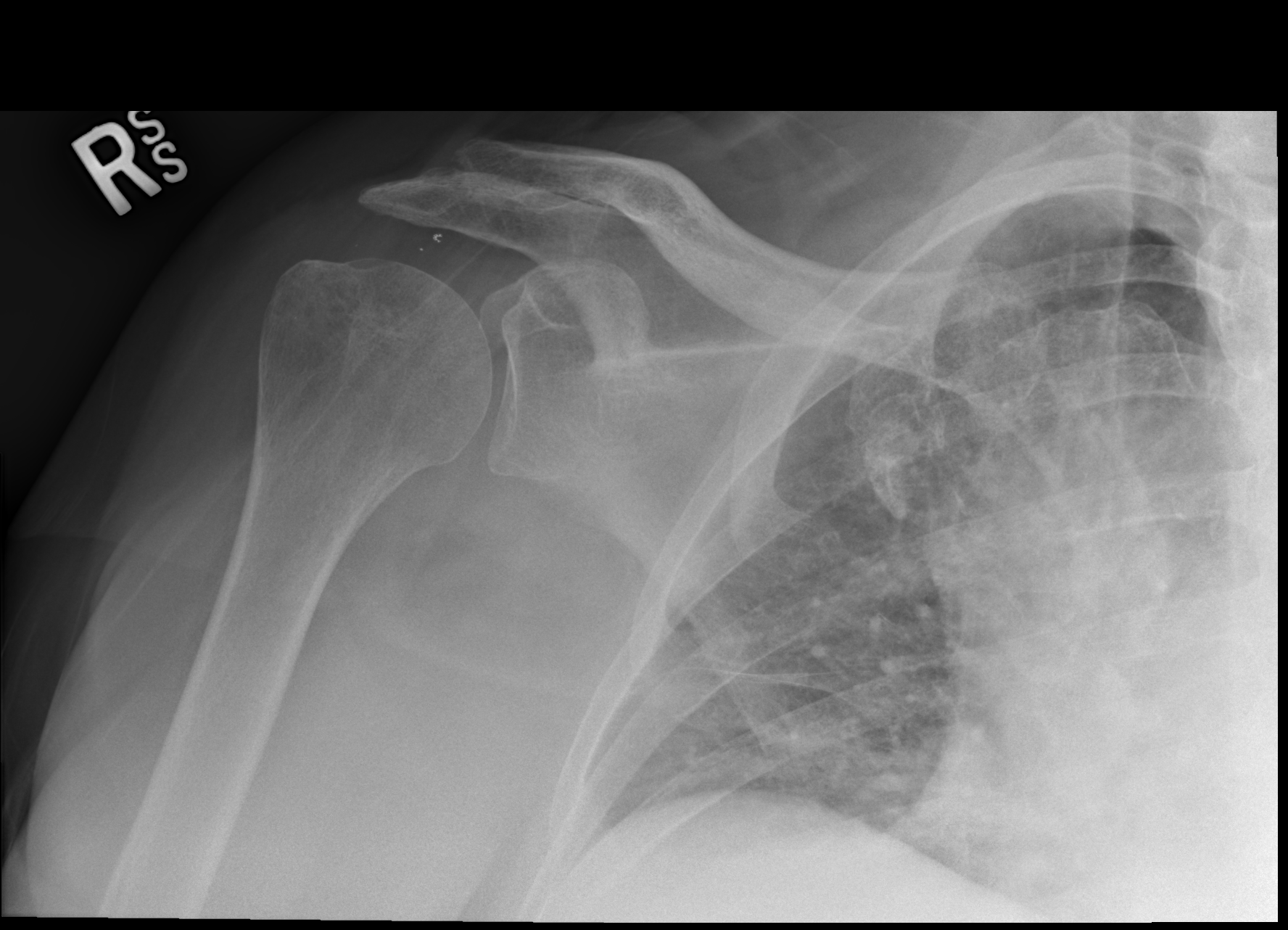

[x shoulder axillary right (2 of 2)]
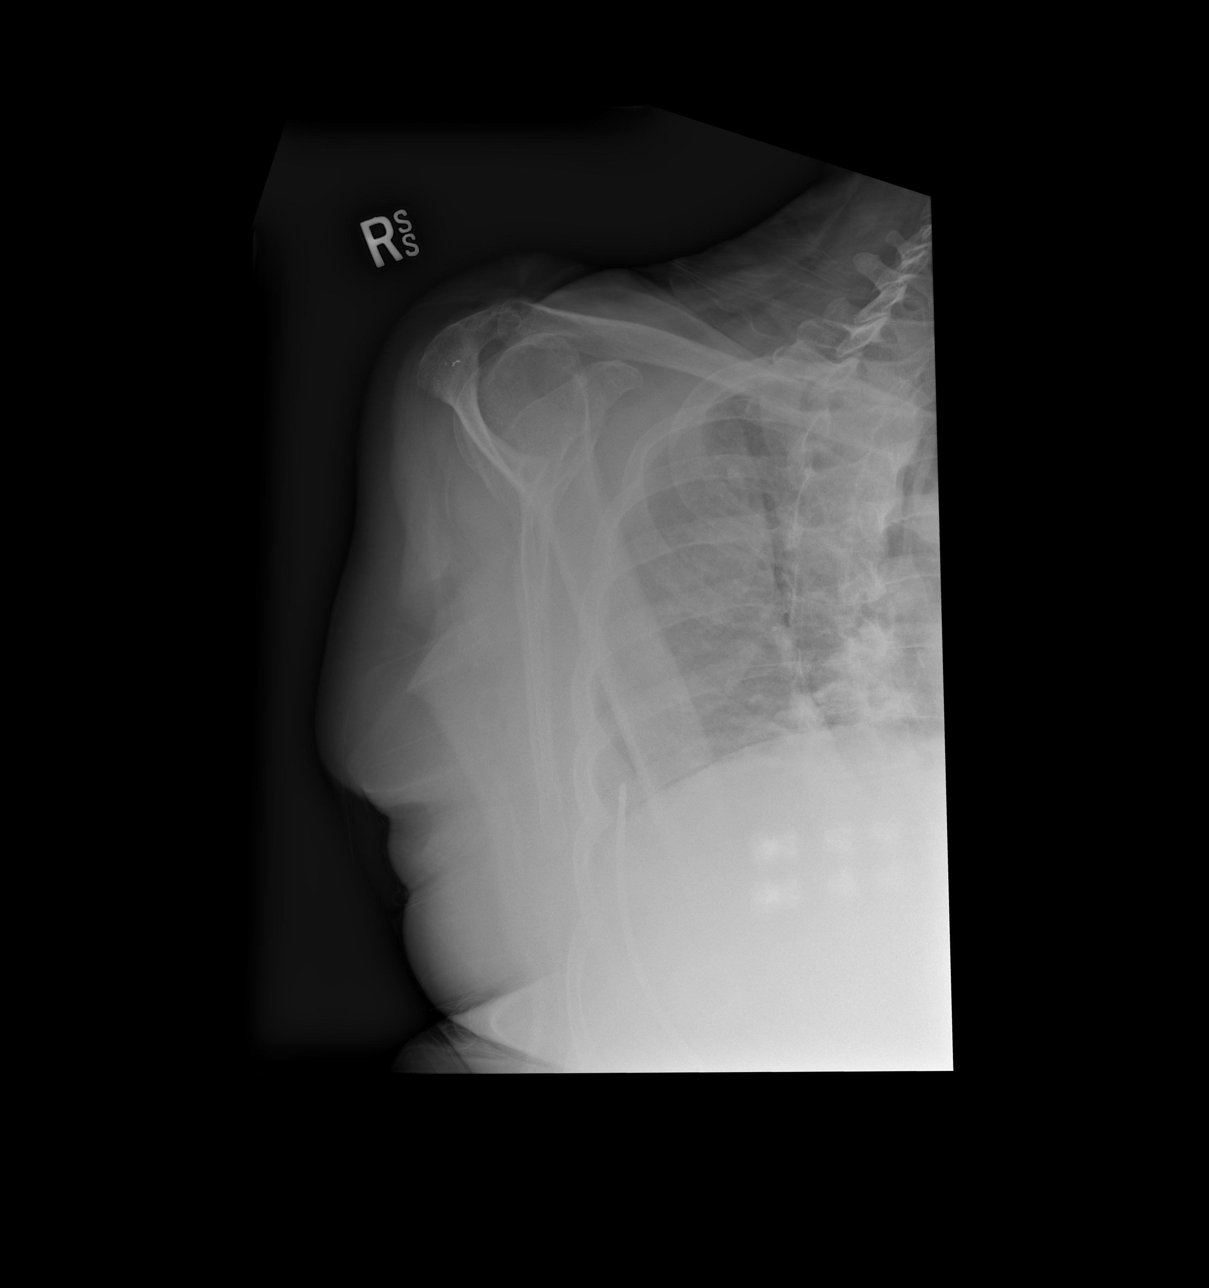

[3 of 3 positions shown; findings below may reference images not displayed]

FINDINGS: The joint spaces are maintained. No acute bony findings. Probable
surgical changes from bony decompressive surgery. A few tiny
metallic fragments are noted in the region of the rotator cuff.
IMPRESSION: 1. Surgical changes likely from bony decompressive surgery. There
are tiny metallic foreign bodies noted.
2. No acute bony findings.

## 2017-05-16 IMAGING — CR DG ANKLE COMPLETE 3+V*L*
3 series · 3 of 3 positions shown · non-contrast
Comparison: None

CLINICAL DATA: Fell downstairs tonight.

EXAM:
LEFT ANKLE COMPLETE - 3+ VIEW

[x ankle ap left]
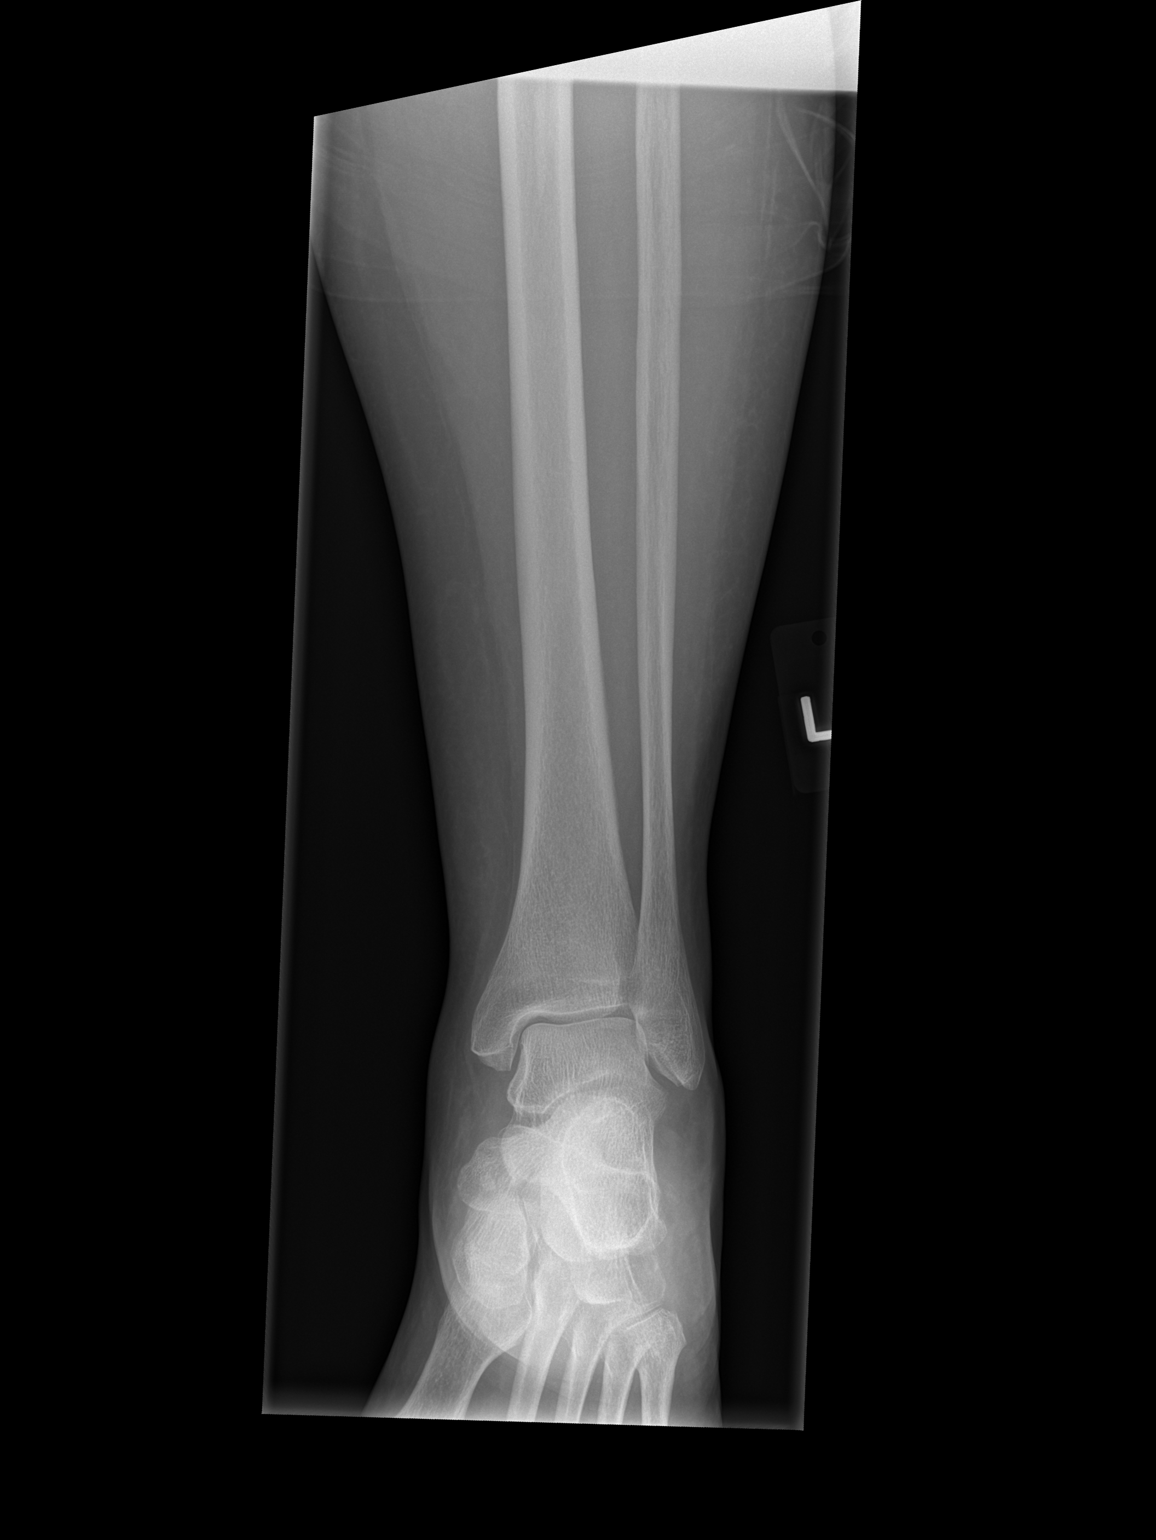

[x ankle obl left]
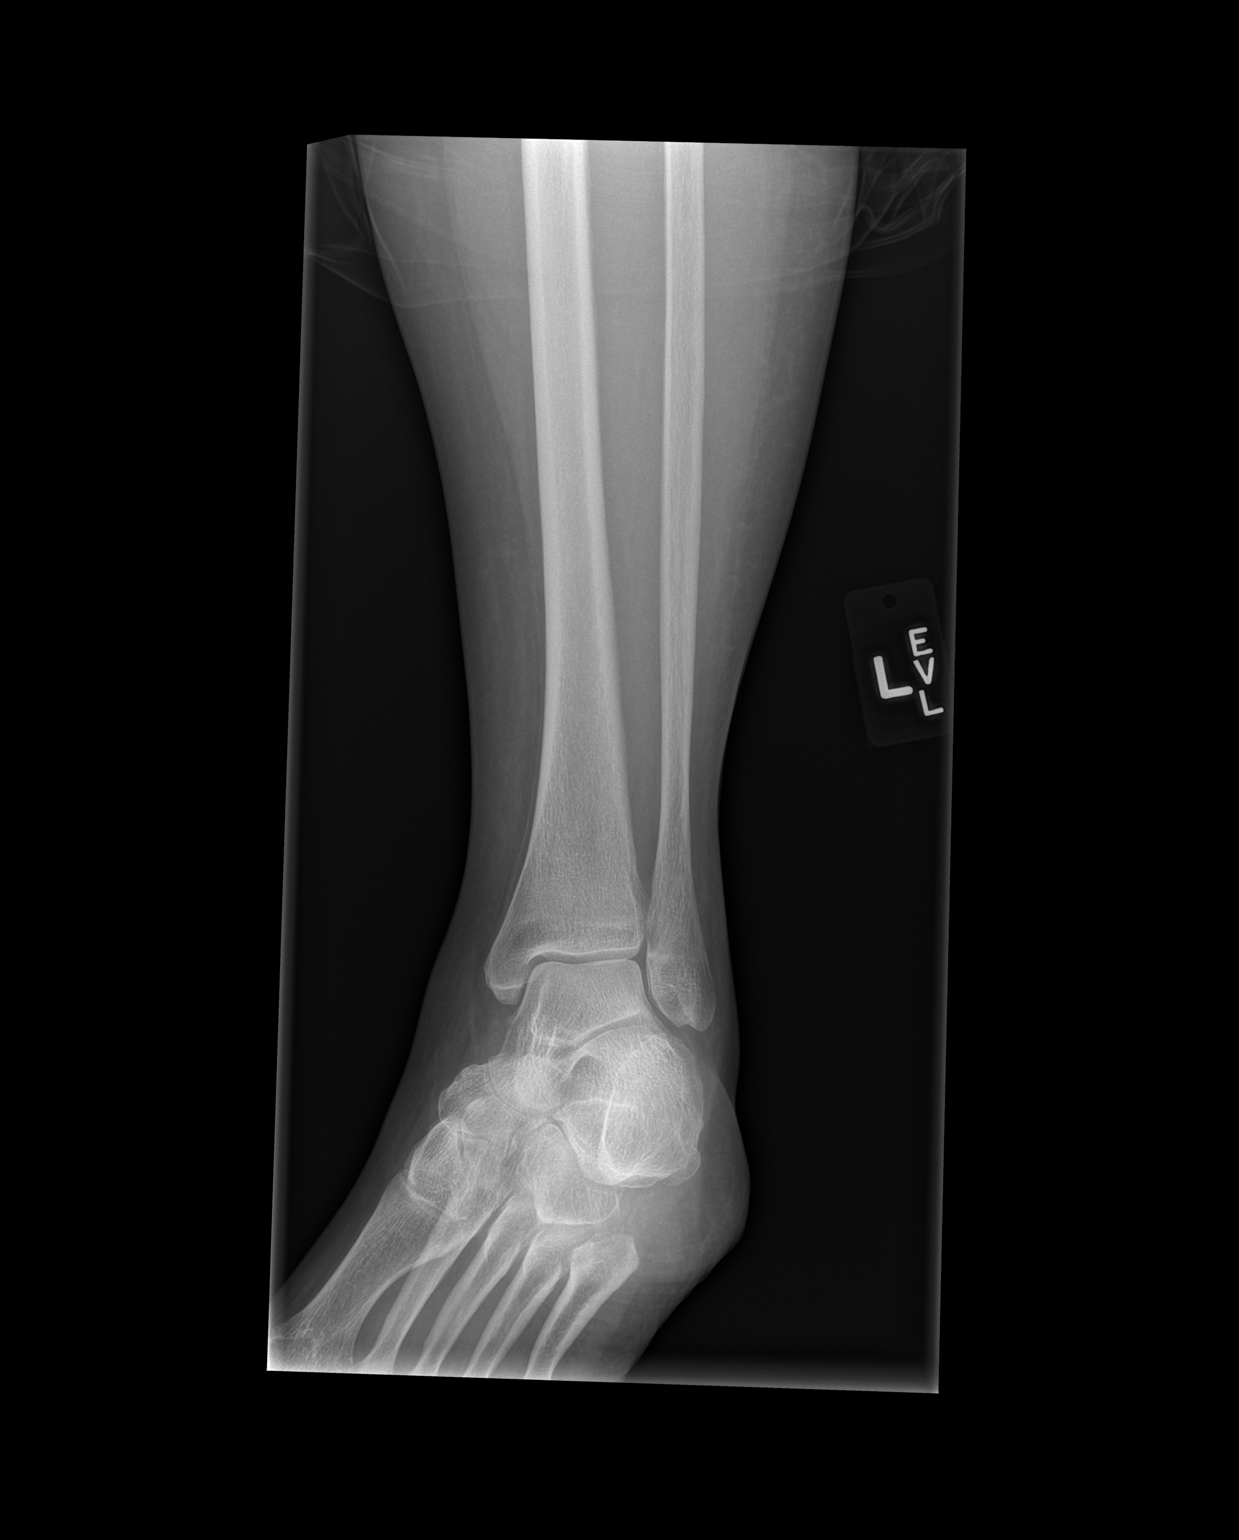

[x ankle lat left]
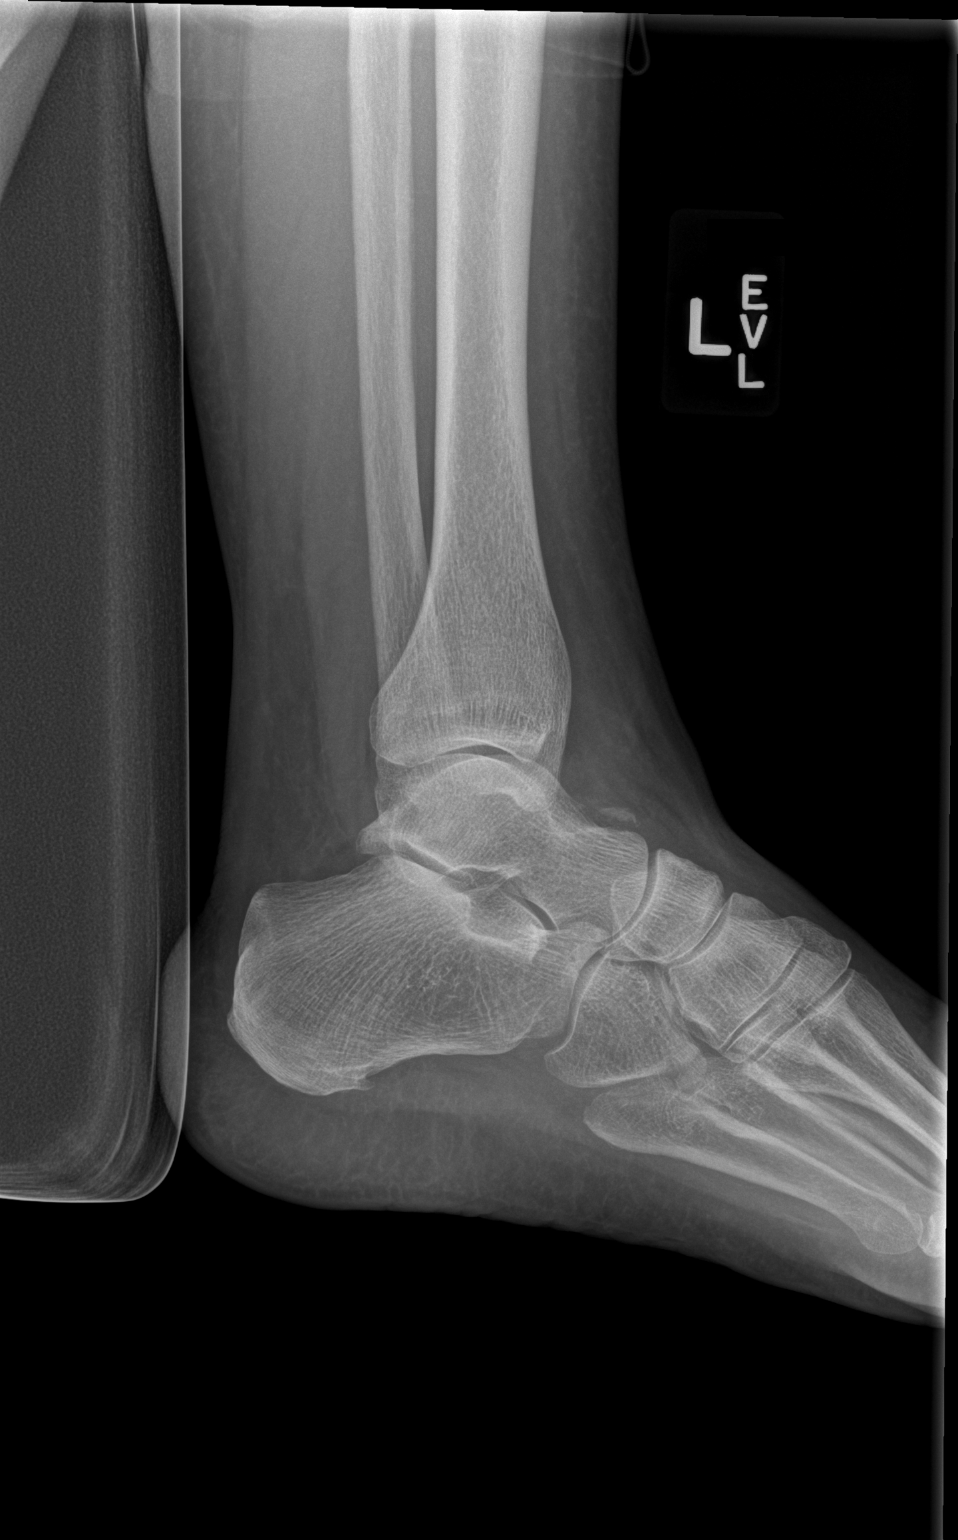

[3 of 3 positions shown; findings below may reference images not displayed]

FINDINGS: The ankle mortise is maintained. No acute ankle fracture is
identified. There is an avulsion fracture involving the distal
dorsal talus with overlying soft tissue swelling.
IMPRESSION: No acute ankle fracture.

Avulsion fracture involving the distal dorsal talus.

## 2017-05-16 IMAGING — CR DG FOOT COMPLETE 3+V*L*
3 series · 3 of 3 positions shown · non-contrast
Comparison: Left ankle films, same date.

CLINICAL DATA: Fell downstairs tonight.

EXAM:
LEFT FOOT - COMPLETE 3+ VIEW

[x foot lat left]
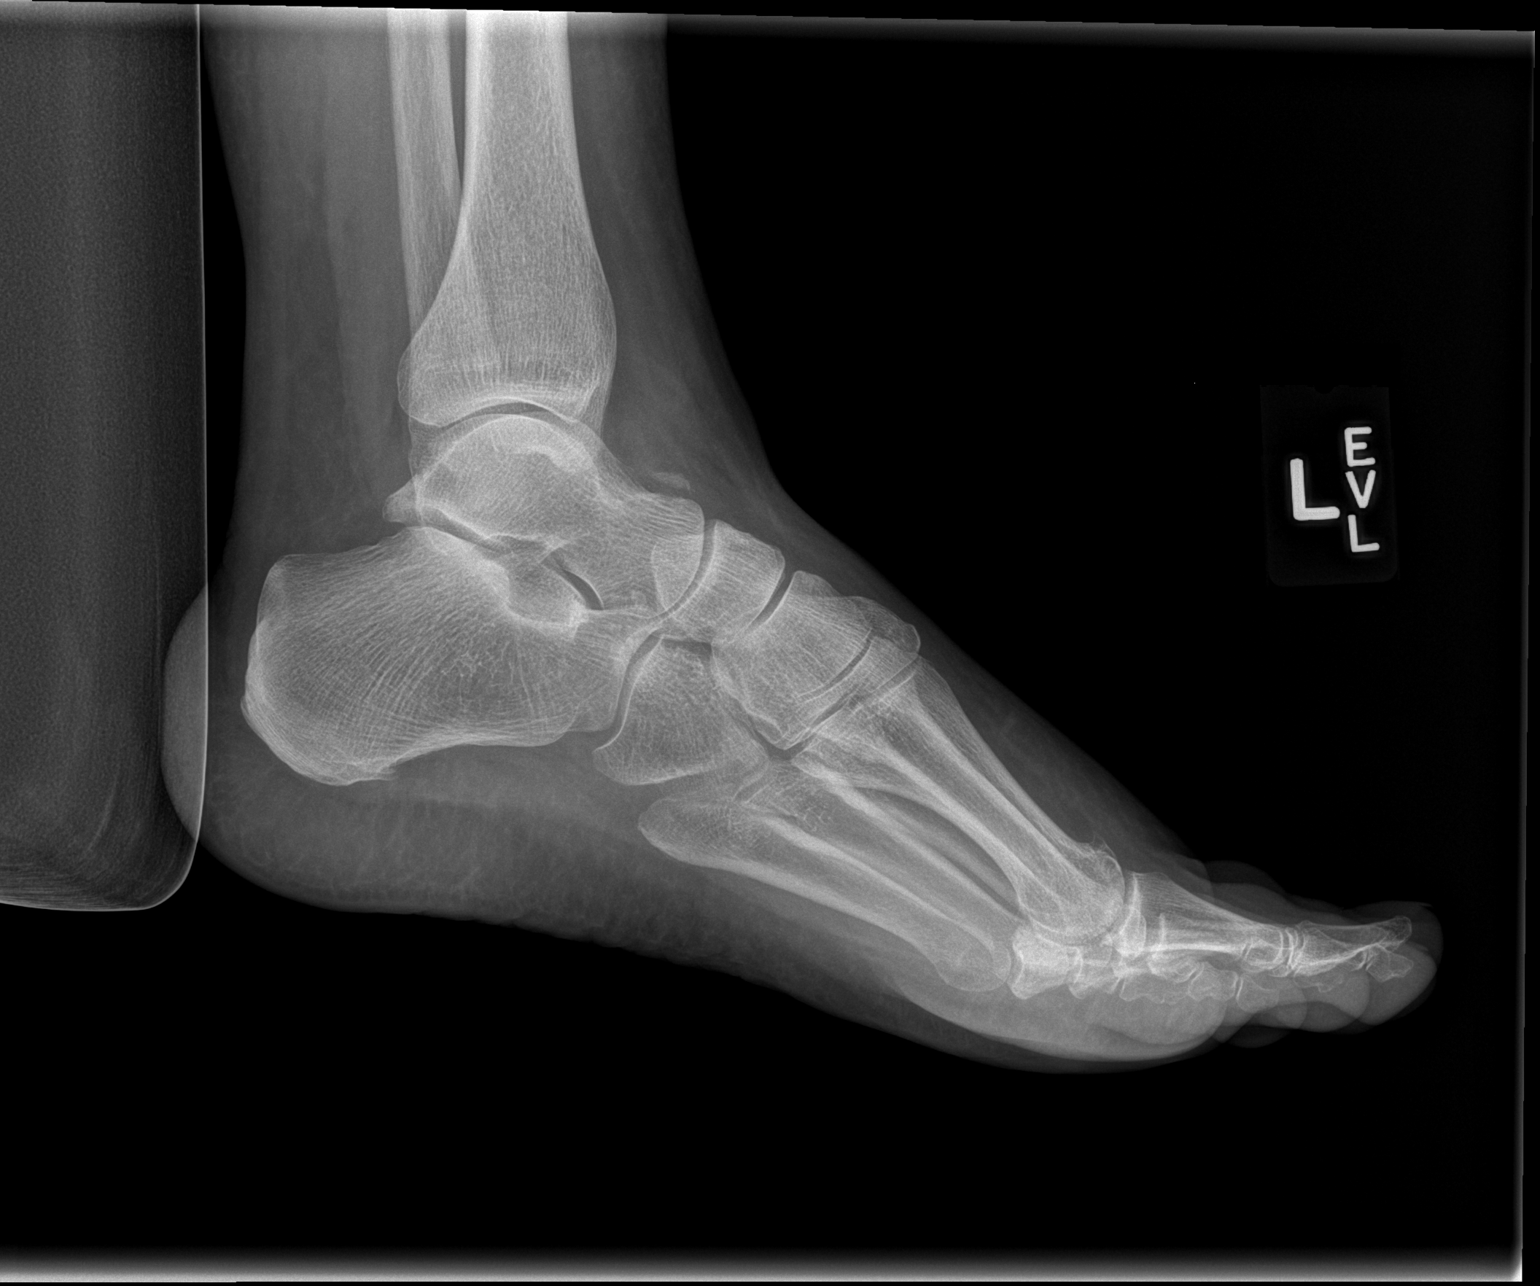

[x foot ap left]
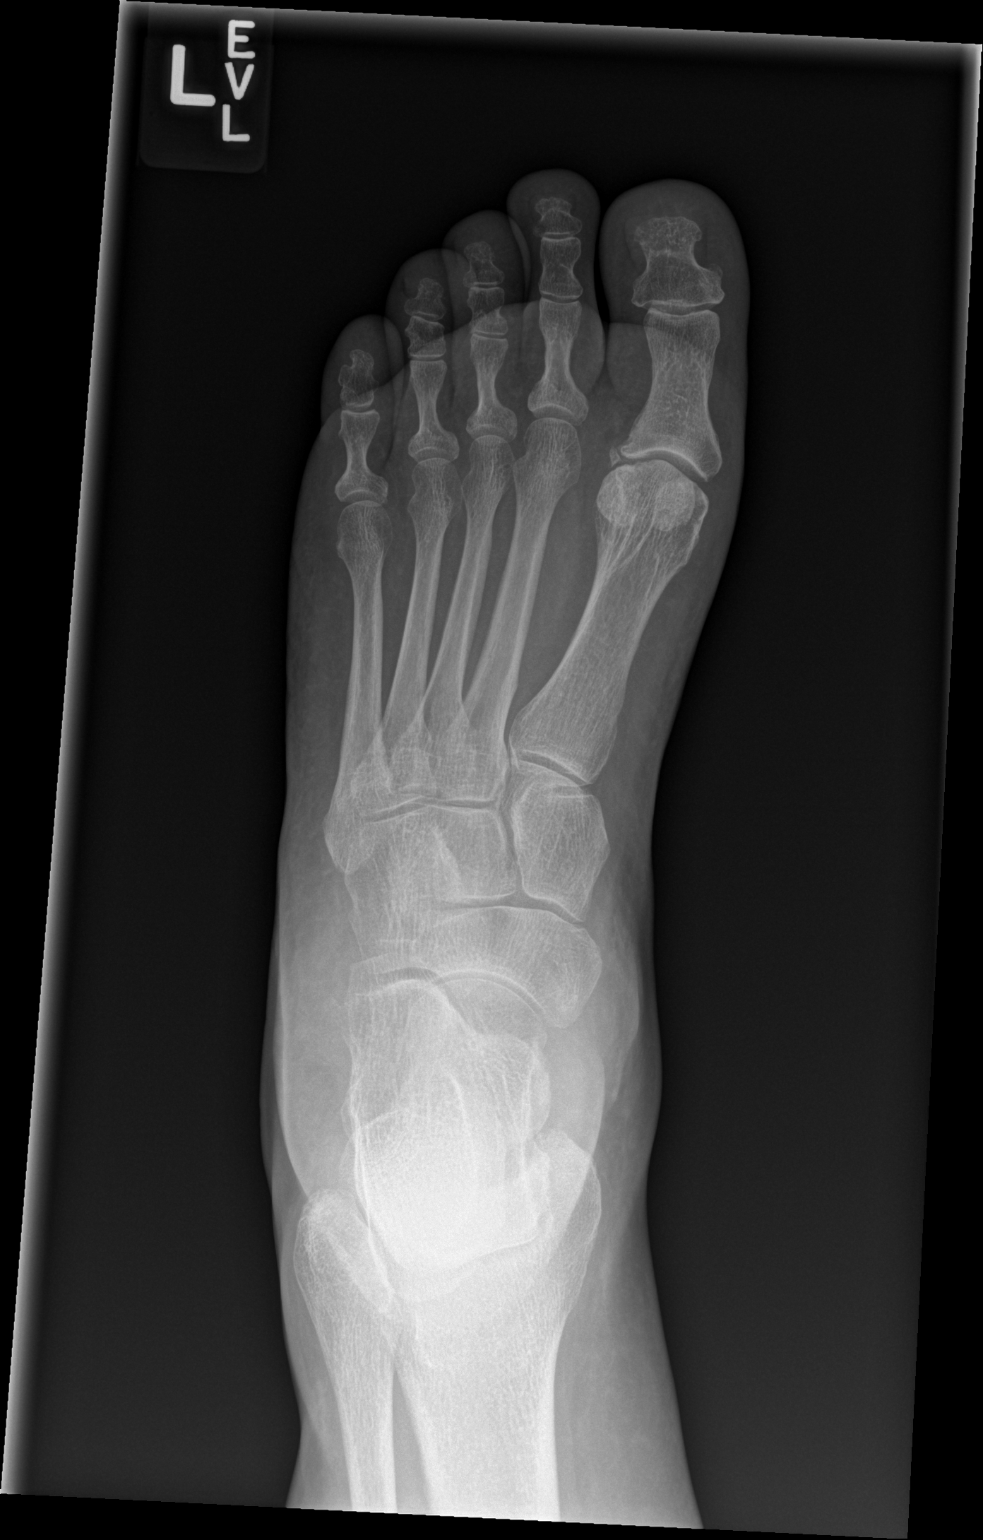

[x foot obl left]
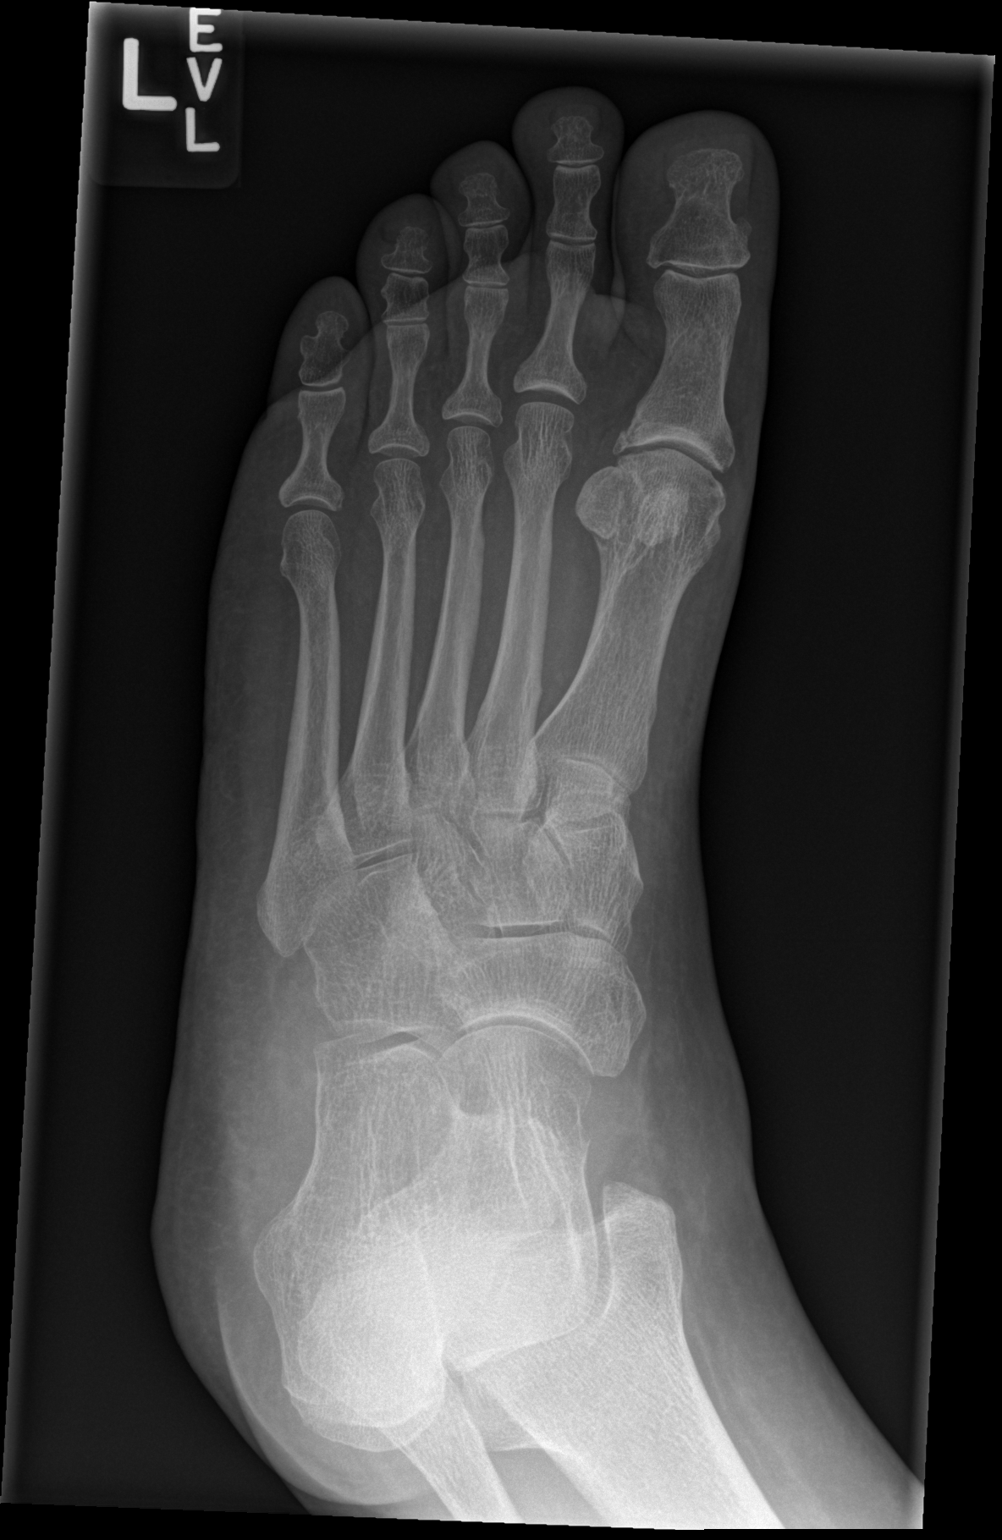

[3 of 3 positions shown; findings below may reference images not displayed]

FINDINGS: The joint spaces are maintained. Mild degenerative changes at the
first metatarsal phalangeal joint and possible old avulsion
fracture. Avulsion fracture off the distal dorsal talus. No other
foot fractures are identified.
IMPRESSION: Avulsion fracture off of the distal dorsal talus. No other definite
foot fractures.

## 2017-05-25 ENCOUNTER — Ambulatory Visit: Payer: Medicare Other | Admitting: Physician Assistant

## 2017-05-25 ENCOUNTER — Telehealth: Payer: Self-pay | Admitting: Physician Assistant

## 2017-05-25 NOTE — Telephone Encounter (Signed)
Noted. No charge. 

## 2017-05-27 ENCOUNTER — Other Ambulatory Visit: Payer: Self-pay | Admitting: Internal Medicine

## 2017-05-27 ENCOUNTER — Other Ambulatory Visit: Payer: Self-pay | Admitting: Cardiology

## 2017-05-27 NOTE — Telephone Encounter (Signed)
Refill once 

## 2017-06-01 ENCOUNTER — Telehealth: Payer: Self-pay | Admitting: Internal Medicine

## 2017-06-02 NOTE — Telephone Encounter (Signed)
Patient with dysphagia and a history of NISSEN.  She is worried that her wrap is undone.  She will come in and see Dr. Carlean Purl on 06/14/17.  She is on Eliquis

## 2017-06-14 ENCOUNTER — Ambulatory Visit: Payer: Medicare Other | Admitting: Internal Medicine

## 2017-07-09 ENCOUNTER — Ambulatory Visit: Payer: Medicare Other | Admitting: Physician Assistant

## 2017-07-10 ENCOUNTER — Other Ambulatory Visit: Payer: Self-pay | Admitting: Internal Medicine

## 2017-07-10 NOTE — Telephone Encounter (Signed)
Refill x 90 days 

## 2017-07-25 ENCOUNTER — Other Ambulatory Visit: Payer: Self-pay | Admitting: Cardiology

## 2017-07-26 NOTE — Telephone Encounter (Signed)
REFILL 

## 2017-07-29 ENCOUNTER — Encounter (INDEPENDENT_AMBULATORY_CARE_PROVIDER_SITE_OTHER): Payer: Self-pay

## 2017-07-29 ENCOUNTER — Ambulatory Visit (INDEPENDENT_AMBULATORY_CARE_PROVIDER_SITE_OTHER): Payer: Medicare Other | Admitting: Physician Assistant

## 2017-07-29 ENCOUNTER — Encounter: Payer: Self-pay | Admitting: Physician Assistant

## 2017-07-29 ENCOUNTER — Telehealth: Payer: Self-pay

## 2017-07-29 VITALS — BP 130/94 | HR 84 | Ht 64.5 in | Wt 197.1 lb

## 2017-07-29 DIAGNOSIS — Z9889 Other specified postprocedural states: Secondary | ICD-10-CM | POA: Diagnosis not present

## 2017-07-29 DIAGNOSIS — R1310 Dysphagia, unspecified: Secondary | ICD-10-CM

## 2017-07-29 DIAGNOSIS — K219 Gastro-esophageal reflux disease without esophagitis: Secondary | ICD-10-CM | POA: Diagnosis not present

## 2017-07-29 NOTE — Telephone Encounter (Signed)
Hold apixaban 2 days prior to procedure and resume after when ok with GI. Olivia Reynolds  

## 2017-07-29 NOTE — Telephone Encounter (Signed)
[facility] [address] [address]  RE: Olivia Reynolds DOB: [birth year] MRN: [id number]   Dear Dr Kirk Ruths,    We have scheduled the above patient for an endoscopic procedure. Our records show that she is on anticoagulation therapy.   Please advise as to how long the patient may come off her therapy of Eliquis prior to the EGD procedure, which is scheduled for 08/11/17.  Please fax back/ or route the completed form to Leo Rod at [phone].   Sincerely,    Dr Silvano Rusk

## 2017-07-29 NOTE — Patient Instructions (Addendum)
You have been scheduled for an endoscopy. Please follow written instructions given to you at your visit today. If you use inhalers (even only as needed), please bring them with you on the day of your procedure. EGD handout provided.   You will be contaced by our office prior to your procedure for directions on holding your Eliquis.  If you do not hear from our office 1 week prior to your scheduled procedure, please call [phone] to discuss.   I appreciate the opportunity to care for you. Ellouise Newer, PA-C

## 2017-07-29 NOTE — Progress Notes (Addendum)
Chief Complaint: Dysphagia  HPI:  Mrs. Westerfeld is a 66 year old female with a past medical history as listed below including AFib (last LVEF 60-65% on 01/01/17) maintained on Eliquis, who was referred to me by Elby Showers, MD for a complaint of dysphagia.     Patient follows with Dr. Carlean Purl for screening colonoscopies, last 09/16/12 with findings of 2 diminutive polyps and severe diverticulosis in the sigmoid colon. It was recommended the patient have a repeat colonoscopy in 5-10 years. Of note, patient's last EGD was performed 10/19/07 with findings of a 9 cm hiatal hernia which was thought to be causing her chest pain, one gastric erosion at the diaphragmatic impingement of the hernia and a mildly tortuous esophagus and it was otherwise normal.    Patient did call our office on 06/01/17 with a complaint of dysphagia and history of Niseen. She was worried that her "wrap was undone".   Today, the patient presents clinic and explains that for the past 6 months she has been having trouble with dysphagia noting that about 3 times a week she will be out to eat and food will get stuck in her throat and she will start hiccupping and then start feeling a burning up into her chest and will typically then regurgitate or vomit the food that she had just eaten. Patient tells me that drinking water seems to make this worse if something is already stuck in there and nothing seems to help other than finally getting the food out. Along with this, the patient has noticed an increase in heartburn and reflux over this period of time, even with just drinking carbonation. Patient notes that for a long time she had no trouble with reflux after her Niseen fundoplication and wonders if something is wrong with this.  Associated symptoms include a pain bilaterally underneath her clavicles and to either side of her esophagus, which is worse during these episodes. She also experiences this pain when laying down at night to go to  bed.   Patient also describes alternation between diarrhea and constipation, this has been going on a long time but has worsened recently with symptoms above. Patient is aware that she has IBS and asks about using a probiotic.   Patient's social history is positive for being [employer] the [employer].    Patient denies fever, chills, blood in her stool, melena, weight loss, anorexia, nausea or symptoms that awaken her at night.  Past Medical History:  Diagnosis Date  . Chronic headaches   . Depression   . Diastolic dysfunction    a. 12/2013 Echo: EF 60-65%, Gr1 DD, Ao sclerosis w/o stenosis, triv MR, mod dil LA, mild TR.  . Diverticulosis   . GERD (gastroesophageal reflux disease)   . History of hiatal hernia    with repair  . History of stress test    a. 10/2008 MV: Ef 75%, nl perfusion.  Marland Kitchen HTN (hypertension)   . Hyperlipidemia   . Obesity   . PAF (paroxysmal atrial fibrillation) ([place])    a. Anticoagulated w/ Eliquis (CHA2DS2VASc = 3).    Past Surgical History:  Procedure Laterality Date  . COLONOSCOPY    . ELBOW SURGERY  left   Tendon Surgery  . EYE SURGERY     L-yey myopia  . HERNIA REPAIR  7371   nissan fundiplication  . KNEE ARTHROSCOPY  2005   R-knee  . LAPAROSCOPIC NISSEN FUNDOPLICATION    . TONSILLECTOMY    . TUBAL LIGATION    .  UPPER GASTROINTESTINAL ENDOSCOPY      Current Outpatient Prescriptions  Medication Sig Dispense Refill  . acetaminophen (TYLENOL) 500 MG tablet Take 500 mg by mouth every 6 (six) hours as needed.    Marland Kitchen apixaban (ELIQUIS) 5 MG TABS tablet Take 1 tablet (5 mg total) by mouth 2 (two) times daily. NEED CARDIOLOGY FOLLOW UP PRIOR TO NEXT REFILL AUTHORIZATION 60 tablet 1  . diazepam (VALIUM) 10 MG tablet TAKE 1 TABLET BY MOUTH EVERY NIGHT AT BEDTIME AS NEEDED FOR SLEEP 30 tablet 2  . diltiazem (CARDIZEM CD) 240 MG 24 hr capsule Take 1 capsule (240 mg total) by mouth daily. 30 capsule 11  . metoprolol succinate (TOPROL-XL) 25 MG 24  hr tablet Take 1 tablet (25 mg total) by mouth daily. Take with or immediately following a meal. 30 tablet 11  . Polyethyl Glycol-Propyl Glycol (SYSTANE OP) Apply 1 drop to eye daily as needed (for sry eyes).    . rosuvastatin (CRESTOR) 10 MG tablet TAKE 1 TABLET(10 MG) BY MOUTH DAILY 30 tablet 1  . sertraline (ZOLOFT) 50 MG tablet TAKE 1 TABLET(50 MG) BY MOUTH DAILY 90 tablet 0   No current facility-administered medications for this visit.     Allergies as of 07/29/2017 - Review Complete 12/02/2016  Allergen Reaction Noted  . Codeine Hives   . Erythromycin Hives   . Statins  01/07/2011  . Hctz [hydrochlorothiazide] Rash 10/15/2011    Family History  Problem Relation Age of Onset  . COPD Mother   . Asthma Mother   . Arthritis Father   . Hypertension Father   . Stroke Father   . Cancer Father   . Prostate cancer Father   . Hypertension Sister   . Diabetes Sister   . Hypertension Brother   . Cancer Sister        UTERINE  . Diabetes Unknown        sibling  . Cancer Son        TESTICULAR  . Crohn's disease Daughter     Social History   Social History  . Marital status: Legally Separated    Spouse name: N/A  . Number of children: N/A  . Years of education: N/A   Occupational History  . Not on file.   Social History Main Topics  . Smoking status: Former Smoker    Types: Cigarettes    Quit date: 09/06/1977  . Smokeless tobacco: Never Used  . Alcohol use No     Comment: once a month  . Drug use: No  . Sexual activity: Not on file   Other Topics Concern  . Not on file   Social History Narrative  . No narrative on file    Review of Systems:    Constitutional: No weight loss, fever or chills Skin: No rash  Cardiovascular: No chest pain Respiratory: No SOB  Gastrointestinal: See HPI and otherwise negative Genitourinary: No dysuria Neurological: No headache Musculoskeletal: No new muscle or joint pain Hematologic: No bleeding Psychiatric: No history of  depression or anxiety   Physical Exam:  Vital signs: BP (!) 130/94 (BP Location: Left Arm, Patient Position: Sitting, Cuff Size: Normal)   Pulse 84   Ht 5' 4.5" (1.638 m) Comment: height measured without shoes  Wt 197 lb 2 oz (89.4 kg)   BMI 33.31 kg/m    Constitutional:   Pleasant Caucasian female appears to be in NAD, Well developed, Well nourished, alert and cooperative Head:  Normocephalic and atraumatic. Eyes:  PEERL, EOMI. No icterus. Conjunctiva pink. Ears:  Normal auditory acuity. Neck:  Supple Throat: Oral cavity and pharynx without inflammation, swelling or lesion.  Respiratory: Respirations even and unlabored. Lungs clear to auscultation bilaterally.   No wheezes, crackles, or rhonchi.  Cardiovascular: Normal S1, S2. No MRG. Regular rate and rhythm. No peripheral edema, cyanosis or pallor.  Gastrointestinal:  Soft, nondistended,mild ttp in epigastrum, No rebound or guarding. Normal bowel sounds. No appreciable masses or hepatomegaly. Rectal:  Not performed.  Msk:  Symmetrical without gross deformities. Without edema, no deformity or joint abnormality.  Neurologic:  Alert and  oriented x4;  grossly normal neurologically.  Skin:   Dry and intact without significant lesions or rashes. Psychiatric:  Demonstrates good judgement and reason without abnormal affect or behaviors.  No recent labs  Assessment: 1. Dysphagia: Worse over the past 6 months at least 3 times a week, happens with solid foods only; concern for esophageal stricture versus ring versus web versus other 2. H/o Nissen fundoplication: Patient is unsure when this was done but it was done for a large hiatal hernia ?2009? 3. GERD: Increased symptoms within the past 6 months, had previously abated after procedure above, also with b/l chest pain under clavicles, ? Relation to GERD?  Plan: 1. Scheduled patient for an EGD +/- dilation with Dr. Carlean Purl in [facility]. Did discuss risks, benefits, limitations and alternatives  the patient agrees to proceed. 2. Discussed with patient that her symptoms may all be helped by a PPI. She wishes to have the endoscopy first before just "starting medicine". She is worried about the side effects of this. 3. Reviewed antireflux diet and lifestyle modifications. 4.  Patient was advised to hold her Eliquisfor 2 days prior to time of her procedure. We will communicate with Dr. Stanford Breed to ensure that holding her Eliquis is acceptable. 5. Discussed high fiber and Align for altering bowel habits, if this continues after resolution of above symptoms, can consider other/ additional therapies 6. Patient to return to clinic per recommendations from Dr. Carlean Purl after time of procedure.  Ellouise Newer, PA-C [facility] 07/29/2017, 1:10 PM  Cc: Elby Showers, MD   Agree with Ms. Dinesh Ulysse's evaluation and management.  Gatha Mayer, MD, [facility]

## 2017-07-30 NOTE — Telephone Encounter (Signed)
Terilyn informed of time to hold her Eliquis and verbalized understanding.

## 2017-08-06 ENCOUNTER — Other Ambulatory Visit: Payer: Self-pay | Admitting: Cardiology

## 2017-08-10 ENCOUNTER — Other Ambulatory Visit: Payer: Self-pay | Admitting: Cardiology

## 2017-08-10 MED ORDER — APIXABAN 5 MG PO TABS: ORAL_TABLET | ORAL | 0 refills | Status: DC

## 2017-08-10 NOTE — Telephone Encounter (Signed)
°*  STAT* If patient is at the pharmacy, call can be transferred to refill team.   1. Which medications need to be refilled? (please list name of each medication and dose if known) eliquis 5mg  2x day  2. Which pharmacy/location (including street and city if local pharmacy) is medication to be sent to? [facility]   3. Do they need a 30 day or 90 day supply? [place]

## 2017-08-11 ENCOUNTER — Ambulatory Visit (AMBULATORY_SURGERY_CENTER): Payer: Medicare Other | Admitting: Internal Medicine

## 2017-08-11 ENCOUNTER — Encounter: Payer: Self-pay | Admitting: Internal Medicine

## 2017-08-11 VITALS — BP 138/78 | HR 71 | Temp 97.3°F | Resp 12 | Ht 64.0 in | Wt 197.0 lb

## 2017-08-11 DIAGNOSIS — K219 Gastro-esophageal reflux disease without esophagitis: Secondary | ICD-10-CM

## 2017-08-11 DIAGNOSIS — K222 Esophageal obstruction: Secondary | ICD-10-CM | POA: Diagnosis not present

## 2017-08-11 DIAGNOSIS — R1310 Dysphagia, unspecified: Secondary | ICD-10-CM | POA: Diagnosis not present

## 2017-08-11 MED ORDER — PANTOPRAZOLE SODIUM 40 MG PO TBEC: 40.0000 mg | DELAYED_RELEASE_TABLET | Freq: Every day | ORAL | 3 refills | Status: DC

## 2017-08-11 MED ORDER — SODIUM CHLORIDE 0.9 % IV SOLN: 500.0000 mL | INTRAVENOUS | Status: DC

## 2017-08-11 NOTE — Progress Notes (Signed)
Pt's states no medical or surgical changes since previsit or office visit. 

## 2017-08-11 NOTE — Patient Instructions (Addendum)
You have a small hiatal hernia - much better than prior to surgery. This appears similar to what was seen on upper GI in 2010 - after surgery. There looked to be some stricture - narrow area that I dilated.  I do think it makes sense to retry acid-blocking medicine and am prescribing pantoprazole.  I appreciate the opportunity to care for you. Gatha Mayer, MD, FACG YOU HAD AN ENDOSCOPIC PROCEDURE TODAY AT [facility]:   Refer to the procedure report that was given to you for any specific questions about what was found during the examination.  If the procedure report does not answer your questions, please call your gastroenterologist to clarify.  If you requested that your care partner not be given the details of your procedure findings, then the procedure report has been included in a sealed envelope for you to review at your convenience later.  YOU SHOULD EXPECT: Some feelings of bloating in the abdomen. Passage of more gas than usual.  Walking can help get rid of the air that was put into your GI tract during the procedure and reduce the bloating. If you had a lower endoscopy (such as a colonoscopy or flexible sigmoidoscopy) you may notice spotting of blood in your stool or on the toilet paper. If you underwent a bowel prep for your procedure, you may not have a normal bowel movement for a few days.  Please Note:  You might notice some irritation and congestion in your nose or some drainage.  This is from the oxygen used during your procedure.  There is no need for concern and it should clear up in a day or so.  SYMPTOMS TO REPORT IMMEDIATELY:    Following upper endoscopy (EGD)  Vomiting of blood or coffee ground material  New chest pain or pain under the shoulder blades  Painful or persistently difficult swallowing  New shortness of breath  Fever of 100F or higher  Black, tarry-looking stools  For urgent or emergent issues, a gastroenterologist can be  reached at any hour by calling [phone].   DIET:  Follow a post-dilation diet (see handout given to you by your recovery nurse) with clear liquids starting at 3:30pm today, followed by a soft diet for the rest of today starting at 4:30pm today. You may resume your regular diet tomorrow.  Drink plenty of fluids but you should avoid alcoholic beverages for 24 hours.  MEDICATIONS: Continue present medications. Resume your Eliquis today at the usual dose. Use Protonix (Pantoprazole) 40 mg by mouth daily.  FOLLOW UP: You have a follow-up appointment scheduled with Dr. Carlean Purl on January 18 at 1:30 pm.  Please see handouts given to you by your recovery nurse.  ACTIVITY:  You should plan to take it easy for the rest of today and you should NOT DRIVE or use heavy machinery until tomorrow (because of the sedation medicines used during the test).    FOLLOW UP: Our staff will call the number listed on your records the next business day following your procedure to check on you and address any questions or concerns that you may have regarding the information given to you following your procedure. If we do not reach you, we will leave a message.  However, if you are feeling well and you are not experiencing any problems, there is no need to return our call.  We will assume that you have returned to your regular daily activities without incident.  If any biopsies were  taken you will be contacted by phone or by letter within the next 1-3 weeks.  Please call us at [phone] if you have not heard about the biopsies in 3 weeks.   Thank you for allowing [place] to provide for your healthcare needs today.  SIGNATURES/CONFIDENTIALITY: You and/or your care partner have signed paperwork which will be entered into your electronic medical record.  These signatures attest to the fact that that the information above on your After Visit Summary has been reviewed and is understood.  Full responsibility of the  confidentiality of this discharge information lies with you and/or your care-partner.

## 2017-08-11 NOTE — Progress Notes (Signed)
A/ox3 pleased with MAC, report to Sarah RN 

## 2017-08-11 NOTE — Progress Notes (Signed)
Called to room to assist during endoscopic procedure.  Patient ID and intended procedure confirmed with present staff. Received instructions for my participation in the procedure from the performing physician.  

## 2017-08-11 NOTE — Op Note (Signed)
[facility] Patient Name: Olivia Reynolds Procedure Date: 08/11/2017 2:01 PM MRN: [id number] Endoscopist: Gatha Mayer , MD Age: 66 Referring MD:  Date of Birth: Jan 08, 1951 Gender: Female Account #: [id number] Procedure:                Upper GI endoscopy Indications:              Dysphagia Medicines:                Propofol per Anesthesia, Monitored Anesthesia Care Procedure:                Pre-Anesthesia Assessment:                           - Prior to the procedure, a History and Physical                            was performed, and patient medications and                            allergies were reviewed. The patient's tolerance of                            previous anesthesia was also reviewed. The risks                            and benefits of the procedure and the sedation                            options and risks were discussed with the patient.                            All questions were answered, and informed consent                            was obtained. Prior Anticoagulants: The patient has                            taken no previous anticoagulant or antiplatelet                            agents. ASA Grade Assessment: II - A patient with                            mild systemic disease. After reviewing the risks                            and benefits, the patient was deemed in                            satisfactory condition to undergo the procedure.                           After obtaining informed consent, the endoscope was  passed under direct vision. Throughout the                            procedure, the patient's blood pressure, pulse, and                            oxygen saturations were monitored continuously. The                            Endoscope was introduced through the mouth, and                            advanced to the second part of duodenum. The upper                            GI endoscopy was  accomplished without difficulty.                            The patient tolerated the procedure well. Scope In: Scope Out: Findings:                 One mild benign-appearing, intrinsic stenosis was                            found at the gastroesophageal junction. This                            measured 1.6 cm (inner diameter) and was traversed.                            A TTS dilator was passed through the scope.                            Dilation with an 18-19-20 mm balloon dilator was                            performed to 20 mm. The dilation site was examined                            and showed mild improvement in luminal narrowing.                            Estimated blood loss was minimal.                           A 5 cm hiatal hernia was present.                           Evidence of a Nissen fundoplication was found in                            the gastric body. The wrap appeared intact.  The exam was otherwise without abnormality.                           The cardia and gastric fundus were otherwise normal                            on retroflexion. Complications:            No immediate complications. Estimated Blood Loss:     Estimated blood loss was minimal. Impression:               - Benign-appearing esophageal stenosis. Dilated.                           - 5 cm hiatal hernia.                           - The examination was otherwise normal.                           - No specimens collected. Recommendation:           - Patient has a contact number available for                            emergencies. The signs and symptoms of potential                            delayed complications were discussed with the                            patient. Return to normal activities tomorrow.                            Written discharge instructions were provided to the                            patient.                           - Clear liquids x  1 hour then soft foods rest of                            day. Start prior diet tomorrow.                           - Continue present medications.                           - Use Protonix (pantoprazole) 40 mg PO daily. Gatha Mayer, MD 08/11/2017 2:36:59 PM This report has been signed electronically.

## 2017-08-12 ENCOUNTER — Telehealth: Payer: Self-pay | Admitting: *Deleted

## 2017-08-12 NOTE — Telephone Encounter (Signed)
  Follow up Call-  Call back number 08/11/2017  Post procedure Call Back phone  # [phone]  Permission to leave phone message Yes  Some recent data might be hidden     Patient questions:  Do you have a fever, pain , or abdominal swelling? No. Pain Score  0 *  Have you tolerated food without any problems? Yes.    Have you been able to return to your normal activities? Yes.    Do you have any questions about your discharge instructions: Diet   No. Medications  No. Follow up visit  No.  Do you have questions or concerns about your Care? No.  Actions: * If pain score is 4 or above: No action needed, pain <4.

## 2017-08-16 ENCOUNTER — Encounter: Payer: Self-pay | Admitting: Cardiology

## 2017-08-18 NOTE — Progress Notes (Deleted)
HPI: FU PAF. Nuclear study in February of 2010 showed an ejection fraction of 75% and normal perfusion. Monitor in June 2015 showed sinus rhythm. Last echocardiogram April 2018 showed normal LV function, grade 1 diastolic dysfunction, trace aortic and mitral regurgitation. Since I last saw her,   Current Outpatient Medications  Medication Sig Dispense Refill  . acetaminophen (TYLENOL) 500 MG tablet Take 500 mg by mouth every 6 (six) hours as needed.    Marland Kitchen apixaban (ELIQUIS) 5 MG TABS tablet Take 1 tablet by mouth twice daily, Schedule MD appt for further refiils 60 tablet 0  . diazepam (VALIUM) 10 MG tablet TAKE 1 TABLET BY MOUTH EVERY NIGHT AT BEDTIME AS NEEDED FOR SLEEP 30 tablet 2  . diltiazem (CARDIZEM CD) 240 MG 24 hr capsule Take 1 capsule (240 mg total) by mouth daily. 30 capsule 11  . metoprolol succinate (TOPROL-XL) 25 MG 24 hr tablet Take 1 tablet (25 mg total) by mouth daily. Take with or immediately following a meal. 30 tablet 11  . pantoprazole (PROTONIX) 40 MG tablet Take 1 tablet (40 mg total) daily before breakfast by mouth. 90 tablet 3  . Polyethyl Glycol-Propyl Glycol (SYSTANE OP) Apply 1 drop to eye daily as needed (for sry eyes).    . rosuvastatin (CRESTOR) 10 MG tablet TAKE 1 TABLET(10 MG) BY MOUTH DAILY 30 tablet 1  . sertraline (ZOLOFT) 50 MG tablet TAKE 1 TABLET(50 MG) BY MOUTH DAILY 90 tablet 0   No current facility-administered medications for this visit.      Past Medical History:  Diagnosis Date  . Chronic headaches   . Depression   . Diastolic dysfunction    a. 12/2013 Echo: EF 60-65%, Gr1 DD, Ao sclerosis w/o stenosis, triv MR, mod dil LA, mild TR.  . Diverticulosis   . GERD (gastroesophageal reflux disease)   . History of hiatal hernia    with repair  . History of stress test    a. 10/2008 MV: Ef 75%, nl perfusion.  Marland Kitchen HTN (hypertension)   . Hyperlipidemia   . Obesity   . PAF (paroxysmal atrial fibrillation) ([place])    a. Anticoagulated w/ Eliquis  (CHA2DS2VASc = 3).    Past Surgical History:  Procedure Laterality Date  . CATARACT EXTRACTION, BILATERAL    . COLONOSCOPY    . ELBOW SURGERY  left   Tendon Surgery  . EYE SURGERY Left    L-yey myopia  . HERNIA REPAIR  6283   nissan fundiplication  . KNEE ARTHROSCOPY  2005   R-knee  . LAPAROSCOPIC NISSEN FUNDOPLICATION    . TONSILLECTOMY    . TUBAL LIGATION    . UPPER GASTROINTESTINAL ENDOSCOPY      Social History   Socioeconomic History  . Marital status: Legally Separated    Spouse name: Not on file  . Number of children: Not on file  . Years of education: Not on file  . Highest education level: Not on file  Social Needs  . Financial resource strain: Not on file  . Food insecurity - worry: Not on file  . Food insecurity - inability: Not on file  . Transportation needs - medical: Not on file  . Transportation needs - non-medical: Not on file  Occupational History  . Occupation: [employer]  Tobacco Use  . Smoking status: Former Smoker    Types: Cigarettes    Last attempt to quit: 09/06/1977    Years since quitting: 39.9  . Smokeless  tobacco: Never Used  Substance and Sexual Activity  . Alcohol use: No    Comment: once a month  . Drug use: No  . Sexual activity: Not on file  Other Topics Concern  . Not on file  Social History Narrative  . Not on file    Family History  Problem Relation Age of Onset  . COPD Mother   . Asthma Mother   . Arthritis Father   . Hypertension Father   . Stroke Father   . Cancer Father   . Prostate cancer Father   . Hypertension Sister   . Diabetes Sister   . Hypertension Brother   . Cancer Sister        UTERINE  . Diabetes Unknown        sibling  . Cancer Son        TESTICULAR  . Crohn's disease Daughter     ROS: no fevers or chills, productive cough, hemoptysis, dysphasia, odynophagia, melena, hematochezia, dysuria, hematuria, rash, seizure activity, orthopnea, PND, pedal edema,  claudication. Remaining systems are negative.  Physical Exam: Well-developed well-nourished in no acute distress.  Skin is warm and dry.  HEENT is normal.  Neck is supple.  Chest is clear to auscultation with normal expansion.  Cardiovascular exam is regular rate and rhythm.  Abdominal exam nontender or distended. No masses palpated. Extremities show no edema. neuro grossly intact  ECG- personally reviewed  A/P  1  Kirk Ruths, MD

## 2017-08-26 ENCOUNTER — Ambulatory Visit: Payer: Medicare Other | Admitting: Cardiology

## 2017-08-29 ENCOUNTER — Other Ambulatory Visit: Payer: Self-pay | Admitting: Cardiology

## 2017-09-06 ENCOUNTER — Ambulatory Visit: Payer: Medicare Other | Admitting: Student

## 2017-09-23 NOTE — Progress Notes (Deleted)
Cardiology Office Note   Date:  09/23/2017   ID:  Olivia Reynolds, DOB 1951/09/22, MRN [id number]  PCP:  Elby Showers, MD  Cardiologist:  Kirk Ruths,. MD No chief complaint on file.    History of Present Illness: Olivia Reynolds is a 66 y.o. female who presents for ongoing assessment and management of paroxysmal atrial fibrillation, hypertension, hyperlipidemia, GERD, and obesity.  The patient was last seen in the office by Ignacia Bayley, NP, with complaints of dyspnea on exertion on 12/02/2016.  Echocardiogram was ordered to evaluate for changes in LV function causing the symptoms.  The antrum, the patient was increased on diltiazem to 240 mg daily, with decreased dose of metoprolol to 25 mg daily.  Continued on anticoagulation with Eliquis.  Of note, decreased dose of beta-blocker in the past did cause increased paroxysms.   Past Medical History:  Diagnosis Date  . Chronic headaches   . Depression   . Diastolic dysfunction    a. 12/2013 Echo: EF 60-65%, Gr1 DD, Ao sclerosis w/o stenosis, triv MR, mod dil LA, mild TR.  . Diverticulosis   . GERD (gastroesophageal reflux disease)   . History of hiatal hernia    with repair  . History of stress test    a. 10/2008 MV: Ef 75%, nl perfusion.  Marland Kitchen HTN (hypertension)   . Hyperlipidemia   . Obesity   . PAF (paroxysmal atrial fibrillation) ([place])    a. Anticoagulated w/ Eliquis (CHA2DS2VASc = 3).    Past Surgical History:  Procedure Laterality Date  . CATARACT EXTRACTION, BILATERAL    . COLONOSCOPY    . ELBOW SURGERY  left   Tendon Surgery  . EYE SURGERY Left    L-yey myopia  . HERNIA REPAIR  2229   nissan fundiplication  . KNEE ARTHROSCOPY  2005   R-knee  . LAPAROSCOPIC NISSEN FUNDOPLICATION    . TONSILLECTOMY    . TUBAL LIGATION    . UPPER GASTROINTESTINAL ENDOSCOPY       Current Outpatient Medications  Medication Sig Dispense Refill  . acetaminophen (TYLENOL) 500 MG tablet Take 500 mg by mouth every 6 (six) hours as  needed.    Marland Kitchen apixaban (ELIQUIS) 5 MG TABS tablet Take 1 tablet by mouth twice daily, Schedule MD appt for further refiils 60 tablet 0  . diazepam (VALIUM) 10 MG tablet TAKE 1 TABLET BY MOUTH EVERY NIGHT AT BEDTIME AS NEEDED FOR SLEEP 30 tablet 2  . diltiazem (CARDIZEM CD) 240 MG 24 hr capsule Take 1 capsule (240 mg total) by mouth daily. 30 capsule 11  . metoprolol succinate (TOPROL-XL) 25 MG 24 hr tablet Take 1 tablet (25 mg total) by mouth daily. Take with or immediately following a meal. 30 tablet 11  . pantoprazole (PROTONIX) 40 MG tablet Take 1 tablet (40 mg total) daily before breakfast by mouth. 90 tablet 3  . Polyethyl Glycol-Propyl Glycol (SYSTANE OP) Apply 1 drop to eye daily as needed (for sry eyes).    . rosuvastatin (CRESTOR) 10 MG tablet TAKE 1 TABLET(10 MG) BY MOUTH DAILY 30 tablet 0  . sertraline (ZOLOFT) 50 MG tablet TAKE 1 TABLET(50 MG) BY MOUTH DAILY 90 tablet 0   No current facility-administered medications for this visit.     Allergies:   Codeine; Erythromycin; Statins; Hctz [hydrochlorothiazide]; and Lipitor [atorvastatin]    Social History:  The patient  reports that she quit smoking about 40 years ago. Her smoking use included cigarettes. she has never used smokeless tobacco.  She reports that she does not drink alcohol or use drugs.   Family History:  The patient's family history includes Arthritis in her father; Asthma in her mother; COPD in her mother; Cancer in her father, sister, and son; Crohn's disease in her daughter; Diabetes in her sister and unknown relative; Hypertension in her brother, father, and sister; Prostate cancer in her father; Stroke in her father.    ROS: All other systems are reviewed and negative. Unless otherwise mentioned in H&P    PHYSICAL EXAM: VS:  There were no vitals taken for this visit. , BMI There is no height or weight on file to calculate BMI. GEN: Well nourished, well developed, in no acute distress  HEENT: normal  Neck: no JVD,  carotid bruits, or masses Cardiac: ***RRR; no murmurs, rubs, or gallops,no edema  Respiratory:  clear to auscultation bilaterally, normal work of breathing GI: soft, nontender, nondistended, + BS MS: no deformity or atrophy  Skin: warm and dry, no rash Neuro:  Strength and sensation are intact Psych: euthymic mood, full affect   EKG:  EKG {ACTION; IS/IS [id number]} ordered today. The ekg ordered today demonstrates ***   Recent Labs: No results found for requested labs within last 8760 hours.    Lipid Panel    Component Value Date/Time   CHOL 195 04/13/2016 1117   TRIG 142 04/13/2016 1117   HDL 61 04/13/2016 1117   CHOLHDL 3.2 04/13/2016 1117   VLDL 28 04/13/2016 1117   LDLCALC 106 04/13/2016 1117      Wt Readings from Last 3 Encounters:  08/11/17 197 lb (89.4 kg)  07/29/17 197 lb 2 oz (89.4 kg)  12/02/16 194 lb 9.6 oz (88.3 kg)      Other studies Reviewed: Echocardiogram Jan 17, 2017 Left ventricle: The cavity size was normal. Wall thickness was   increased in a pattern of mild LVH. Systolic function was normal.   The estimated ejection fraction was in the range of 60% to 65%.   Doppler parameters are consistent with abnormal left ventricular   relaxation (grade 1 diastolic dysfunction). The E/e&' ratio is   between 8-15, suggesting indeterminate LV filling pressure. - Aortic valve: Trileaflet. Sclerosis without stenosis. There was   trivial regurgitation. - Mitral valve: Mildly thickened leaflets . There was trivial   regurgitation. - Left atrium: The atrium was normal in size. - Tricuspid valve: There was mild regurgitation. - Pulmonary arteries: PA peak pressure: 27 mm Hg (S). - Inferior vena cava: The vessel was normal in size. The   respirophasic diameter changes were in the normal range (>= 50%),   consistent with normal central venous pressure.   ASSESSMENT AND PLAN:  1.  ***   Current medicines are reviewed at length with the patient today.     Labs/ tests ordered today include: *** Phill Myron. West Pugh, ANP, AACC   09/23/2017 12:20 PM    [facility] [address] [place], [place] [address] Phone: [phone]; Fax: [phone]

## 2017-09-27 ENCOUNTER — Ambulatory Visit: Payer: Medicare Other | Admitting: Adult Health

## 2017-10-04 ENCOUNTER — Ambulatory Visit: Payer: Medicare Other | Admitting: Adult Health

## 2017-10-04 NOTE — Progress Notes (Deleted)
Cardiology Office Note   Date:  10/04/2017   ID:  [name], DOB 11/18/1950, MRN [id number]  PCP:  Olivia Showers, MD  Cardiologist:  Olivia Reynolds chief complaint on file.    History of Present Illness: Olivia Reynolds is a 67 y.o. female who presents for ongoing assessment and management of paroxysmal atrial fibrillation, hypertension, hyperlipidemia, with other history to include GERD and obesity.  Patient was last seen in the office in March 2018 by Olivia Reynolds, nurse practitioner.  At that time the patient was continuing to complain of dyspnea on exertion, she was intolerant to beta-blocker due to significant fatigue and she was discontinued from this.  She was placed on diltiazem 180 mg daily, and was recurrent atrial fib was placed back on metoprolol 50 mg daily.  On that last office visit echocardiogram was ordered for evaluation of changes in LV function causing her to have worsening dyspnea and fatigue.  Metoprolol was decreased to 25 mg daily and diltiazem was increased to 240 mg daily.  Need on Eliquis for anticoagulation.  Echocardiogram 01/01/2017 Left ventricle: The cavity size was normal. Wall thickness was   increased in a pattern of mild LVH. Systolic function was normal.   The estimated ejection fraction was in the range of 60% to 65%.   Doppler parameters are consistent with abnormal left ventricular   relaxation (grade 1 diastolic dysfunction). The E/e&' ratio is   between 8-15, suggesting indeterminate LV filling pressure. - Aortic valve: Trileaflet. Sclerosis without stenosis. There was   trivial regurgitation. - Mitral valve: Mildly thickened leaflets . There was trivial   regurgitation. - Left atrium: The atrium was normal in size. - Tricuspid valve: There was mild regurgitation. - Pulmonary arteries: PA peak pressure: 27 mm Hg (S). - Inferior vena cava: The vessel was normal in size. The   respirophasic diameter changes were in the normal range (>= 50%),  consistent with normal central venous pressure.  Past Medical History:  Diagnosis Date  . Chronic headaches   . Depression   . Diastolic dysfunction    a. 12/2013 Echo: EF 60-65%, Gr1 DD, Ao sclerosis w/o stenosis, triv MR, mod dil LA, mild TR.  . Diverticulosis   . GERD (gastroesophageal reflux disease)   . History of hiatal hernia    with repair  . History of stress test    a. 10/2008 MV: Ef 75%, nl perfusion.  Olivia Reynolds HTN (hypertension)   . Hyperlipidemia   . Obesity   . PAF (paroxysmal atrial fibrillation) ([place])    a. Anticoagulated w/ Eliquis (CHA2DS2VASc = 3).    Past Surgical History:  Procedure Laterality Date  . CATARACT EXTRACTION, BILATERAL    . COLONOSCOPY    . ELBOW SURGERY  left   Tendon Surgery  . EYE SURGERY Left    L-yey myopia  . HERNIA REPAIR  1884   nissan fundiplication  . KNEE ARTHROSCOPY  2005   R-knee  . LAPAROSCOPIC NISSEN FUNDOPLICATION    . TONSILLECTOMY    . TUBAL LIGATION    . UPPER GASTROINTESTINAL ENDOSCOPY       Current Outpatient Medications  Medication Sig Dispense Refill  . acetaminophen (TYLENOL) 500 MG tablet Take 500 mg by mouth every 6 (six) hours as needed.    Olivia Reynolds apixaban (ELIQUIS) 5 MG TABS tablet Take 1 tablet by mouth twice daily, Schedule MD appt for further refiils 60 tablet 0  . diazepam (VALIUM) 10 MG tablet TAKE 1 TABLET BY MOUTH EVERY  NIGHT AT BEDTIME AS NEEDED FOR SLEEP 30 tablet 2  . diltiazem (CARDIZEM CD) 240 MG 24 hr capsule Take 1 capsule (240 mg total) by mouth daily. 30 capsule 11  . metoprolol succinate (TOPROL-XL) 25 MG 24 hr tablet Take 1 tablet (25 mg total) by mouth daily. Take with or immediately following a meal. 30 tablet 11  . pantoprazole (PROTONIX) 40 MG tablet Take 1 tablet (40 mg total) daily before breakfast by mouth. 90 tablet 3  . Polyethyl Glycol-Propyl Glycol (SYSTANE OP) Apply 1 drop to eye daily as needed (for sry eyes).    . rosuvastatin (CRESTOR) 10 MG tablet TAKE 1 TABLET(10 MG) BY MOUTH DAILY 30  tablet 0  . sertraline (ZOLOFT) 50 MG tablet TAKE 1 TABLET(50 MG) BY MOUTH DAILY 90 tablet 0   No current facility-administered medications for this visit.     Allergies:   Codeine; Erythromycin; Statins; Hctz [hydrochlorothiazide]; and Lipitor [atorvastatin]    Social History:  The patient  reports that she quit smoking about 40 years ago. Her smoking use included cigarettes. she has never used smokeless tobacco. She reports that she does not drink alcohol or use drugs.   Family History:  The patient's family history includes Arthritis in her father; Asthma in her mother; COPD in her mother; Cancer in her father, sister, and son; Crohn's disease in her daughter; Diabetes in her sister and unknown relative; Hypertension in her brother, father, and sister; Prostate cancer in her father; Stroke in her father.    ROS: All other systems are reviewed and negative. Unless otherwise mentioned in H&P    PHYSICAL EXAM: VS:  There were no vitals taken for this visit. , BMI There is no height or weight on file to calculate BMI. GEN: Well nourished, well developed, in no acute distress HEENT: normal Neck: no JVD, carotid bruits, or masses Cardiac: ***RRR; no murmurs, rubs, or gallops,no edema  Respiratory:  clear to auscultation bilaterally, normal work of breathing GI: soft, nontender, nondistended, + BS MS: no deformity or atrophy Skin: warm and dry, no rash Neuro:  Strength and sensation are intact Psych: euthymic mood, full affect   EKG:  EKG {ACTION; IS/IS [id number]} ordered today. The ekg ordered today demonstrates ***   Recent Labs: No results found for requested labs within last 8760 hours.    Lipid Panel    Component Value Date/Time   CHOL 195 04/13/2016 1117   TRIG 142 04/13/2016 1117   HDL 61 04/13/2016 1117   CHOLHDL 3.2 04/13/2016 1117   VLDL 28 04/13/2016 1117   LDLCALC 106 04/13/2016 1117      Wt Readings from Last 3 Encounters:  08/11/17 197 lb (89.4 kg)    07/29/17 197 lb 2 oz (89.4 kg)  12/02/16 194 lb 9.6 oz (88.3 kg)      Other studies Reviewed: Additional studies/ records that were reviewed today include: ***. Review of the above records demonstrates: ***   ASSESSMENT AND PLAN:  1.  ***   Current medicines are reviewed at length with the patient today.    Labs/ tests ordered today include: *** Phill Myron. West Pugh, ANP, AACC   10/04/2017 8:27 AM    [facility] [address] [place], [place] [address] Phone: [phone]; Fax: [phone]

## 2017-10-05 ENCOUNTER — Other Ambulatory Visit: Payer: Self-pay | Admitting: Cardiology

## 2017-10-15 ENCOUNTER — Ambulatory Visit: Payer: Medicare Other | Admitting: Internal Medicine

## 2017-10-25 ENCOUNTER — Other Ambulatory Visit: Payer: Self-pay | Admitting: Cardiology

## 2017-10-27 ENCOUNTER — Other Ambulatory Visit: Payer: Self-pay | Admitting: Internal Medicine

## 2017-10-27 ENCOUNTER — Other Ambulatory Visit: Payer: Self-pay | Admitting: Cardiology

## 2017-10-28 ENCOUNTER — Telehealth: Payer: Self-pay | Admitting: Internal Medicine

## 2017-10-28 MED ORDER — DIAZEPAM 10 MG PO TABS: 10.0000 mg | ORAL_TABLET | Freq: Every evening | ORAL | 0 refills | Status: DC | PRN

## 2017-10-28 MED ORDER — CYCLOBENZAPRINE HCL 10 MG PO TABS: 10.0000 mg | ORAL_TABLET | Freq: Every day | ORAL | 0 refills | Status: DC

## 2017-10-28 NOTE — Telephone Encounter (Signed)
Follw up with DR Stanford Breed due on Sept/2018  Patient cancelled appointments in:  Nov/2018  Dec/2018 x 2  Jan/2019  Last blood work available is from Dec/2017.  Additional refill are NOT appropriate until Blood work and follow up completed.

## 2017-10-28 NOTE — Telephone Encounter (Signed)
Call in Prednisone 10 mg( #21) take in tapering course as directed 6-5-4-3-2-1. Flexeril 10 mg #30 one po qhs

## 2017-10-28 NOTE — Telephone Encounter (Signed)
She is packing up the [employer] office and she has to have everything out tomorrow.  She still has about 1/2 dozen boxes to go.  She has had to pack everything herself.  She has movers, but still has to pack everything up.  She says that no one has helped her; not one physician has offered, not one of her children has helped her.    Her back is giving her a fit.  Says that she's either having spasms or she's just hurting.  Says that she got home last night and once she sat down, she just couldn't get up.  She said she needs to get through tomorrow to get the rest packed up.  Wants to know if you will call her in something, anything to help get her through....PLEASE.  Says maybe a muscle relaxant or something will give her some relief??     Pharmacy:  [facility] @ [place] # for contact:  [phone]  Thank you.

## 2017-11-10 ENCOUNTER — Other Ambulatory Visit: Payer: Self-pay | Admitting: Cardiology

## 2017-11-22 NOTE — Progress Notes (Signed)
Cardiology Office Note   Date:  11/23/2017   ID:  Olivia Reynolds, DOB 03/10/1951, MRN [id number]  PCP:  Elby Showers, MD  Cardiologist: Dr. Stanford Breed Chief Complaint  Patient presents with  . Follow-up    discuss Eliquis, wants sleep aid  . Chest Pain  . Atrial Fibrillation     History of Present Illness: Olivia Reynolds is a 67 y.o. female who presents for ongoing assessment and management of paroxysmal atrial fibrillation, hyperlipidemia, hypertension, history of obesity and GERD.  He was last seen in the office by Murray Hodgkins NP, with some complaints of dyspnea on exertion.   A follow-up echocardiogram was ordered, and it was felt that her symptoms were related to deconditioning in the setting of obesity.  She had occasional issues of paroxysms concerning atrial fibrillation but they were not bothersome to her.  Her beta-blocker was decreased to 25 mg daily in the setting of fatigue but if she had recurrent issues with paroxysmal atrial fibrillation she was to report this.  Diltiazem was increased in the setting of reduced beta-blocker along with hypertension.  Echocardiogram 12/2016 Left ventricle: The cavity size was normal. Wall thickness was   increased in a pattern of mild LVH. Systolic function was normal.   The estimated ejection fraction was in the range of 60% to 65%.   Doppler parameters are consistent with abnormal left ventricular   relaxation (grade 1 diastolic dysfunction). The E/e&' ratio is   between 8-15, suggesting indeterminate LV filling pressure. - Aortic valve: Trileaflet. Sclerosis without stenosis. There was   trivial regurgitation. - Mitral valve: Mildly thickened leaflets . There was trivial   regurgitation. - Left atrium: The atrium was normal in size. - Tricuspid valve: There was mild regurgitation. - Pulmonary arteries: PA peak pressure: 27 mm Hg (S). - Inferior vena cava: The vessel was normal in size. The   respirophasic diameter changes were  in the normal range (>= 50%),   consistent with normal central venous pressure.  Impressions:  - Compared to a prior study in 2015, there are no significant   changes. The LA now measures normal by volume.   She comes today with complain bandlike chest pain, under her breasts, usually occurring bending over to pick something up, or mild exertion. Nonradiating, causes mild, and diaphoresis. Her overall energy level has decreased.  She is medically compliant, denies any bleeding, hemoptysis, or melanoma on ELIQUIS. She is requesting assistance with ELIQUIS cost.   She also complained muscle aches and pain, usually in her joints knees and left ankle. He was intolerant of Lipitor but appears to be tolerating Crestor, it is uncertain if the Crestor her lower extremity pain or not.  Past Medical History:  Diagnosis Date  . Chronic headaches   . Depression   . Diastolic dysfunction    a. 12/2013 Echo: EF 60-65%, Gr1 DD, Ao sclerosis w/o stenosis, triv MR, mod dil LA, mild TR.  . Diverticulosis   . GERD (gastroesophageal reflux disease)   . History of hiatal hernia    with repair  . History of stress test    a. 10/2008 MV: Ef 75%, nl perfusion.  Marland Kitchen HTN (hypertension)   . Hyperlipidemia   . Obesity   . PAF (paroxysmal atrial fibrillation) ([place])    a. Anticoagulated w/ Eliquis (CHA2DS2VASc = 3).    Past Surgical History:  Procedure Laterality Date  . CATARACT EXTRACTION, BILATERAL    . COLONOSCOPY    . ELBOW SURGERY  left   Tendon Surgery  . EYE SURGERY Left    L-yey myopia  . HERNIA REPAIR  3382   nissan fundiplication  . KNEE ARTHROSCOPY  2005   R-knee  . LAPAROSCOPIC NISSEN FUNDOPLICATION    . TONSILLECTOMY    . TUBAL LIGATION    . UPPER GASTROINTESTINAL ENDOSCOPY       Current Outpatient Medications  Medication Sig Dispense Refill  . acetaminophen (TYLENOL) 500 MG tablet Take 500 mg by mouth every 6 (six) hours as needed.    Marland Kitchen apixaban (ELIQUIS) 5 MG TABS tablet Take  1 tablet (5 mg total) by mouth 2 (two) times daily. CARDIOLOGIST FOLLOW UP NEEDED PRIOR TO NEXT REFILL AUTHORIZATION 30 tablet 0  . cyclobenzaprine (FLEXERIL) 10 MG tablet Take 1 tablet (10 mg total) by mouth at bedtime. 30 tablet 0  . diazepam (VALIUM) 10 MG tablet Take 1 tablet (10 mg total) by mouth at bedtime as needed. for sleep (Patient taking differently: Take 10 mg by mouth at bedtime as needed (Patient doesnt take if she has taken Flexeril). for sleep) 30 tablet 0  . diltiazem (CARDIZEM CD) 240 MG 24 hr capsule Take 1 capsule (240 mg total) by mouth daily. 30 capsule 11  . metoprolol succinate (TOPROL-XL) 25 MG 24 hr tablet Take 1 tablet (25 mg total) by mouth daily. Take with or immediately following a meal. 30 tablet 11  . pantoprazole (PROTONIX) 40 MG tablet Take 1 tablet (40 mg total) daily before breakfast by mouth. 90 tablet 3  . Polyethyl Glycol-Propyl Glycol (SYSTANE OP) Apply 1 drop to eye daily as needed (for sry eyes).    . rosuvastatin (CRESTOR) 10 MG tablet TAKE 1 TABLET BY MOUTH DAILY 30 tablet 1  . sertraline (ZOLOFT) 50 MG tablet TAKE 1 TABLET(50 MG) BY MOUTH DAILY 90 tablet 0   No current facility-administered medications for this visit.     Allergies:   Erythromycin; Statins; Codeine; Hctz [hydrochlorothiazide]; and Lipitor [atorvastatin]    Social History:  The patient  reports that she quit smoking about 40 years ago. Her smoking use included cigarettes. she has never used smokeless tobacco. She reports that she does not drink alcohol or use drugs.   Family History:  The patient's family history includes Arthritis in her father; Asthma in her mother; COPD in her mother; Cancer in her father, sister, and son; Crohn's disease in her daughter; Diabetes in her sister and unknown relative; Hypertension in her brother, father, and sister; Prostate cancer in her father; Stroke in her father.    ROS: All other systems are reviewed and negative. Unless otherwise mentioned in  H&P    PHYSICAL EXAM: VS:  BP 124/84   Pulse 80   Ht 5' 4.5" (1.638 m)   Wt 192 lb (87.1 kg)   BMI 32.45 kg/m  , BMI Body mass index is 32.45 kg/m. GEN: Well nourished, well developed, in no acute distress obese HEENT: normal  Neck: no JVD, carotid bruits, or masses Cardiac: RRR; soft 1/6 systolic murmurs no rubs, or gallops,no edema  Respiratory:  clear to auscultation bilaterally, normal work of breathing GI: soft, nontender, nondistended, + BS MS: no deformity or atrophy some pain with range of motion of legs in the knees and ankles Skin: warm and dry, no rash Neuro:  Strength and sensation are intact Psych: euthymic mood, full affect  EKG:  Normal sinus rhythm, 80 bpm.  Recent Labs: No results found for requested labs within last 8760 hours.  Lipid Panel    Component Value Date/Time   CHOL 195 04/13/2016 1117   TRIG 142 04/13/2016 1117   HDL 61 04/13/2016 1117   CHOLHDL 3.2 04/13/2016 1117   VLDL 28 04/13/2016 1117   LDLCALC 106 04/13/2016 1117      Wt Readings from Last 3 Encounters:  11/23/17 192 lb (87.1 kg)  08/11/17 197 lb (89.4 kg)  07/29/17 197 lb 2 oz (89.4 kg)      Other studies Reviewed: As above  ASSESSMENT AND PLAN:  1.  Recurrent chest discomfort: Described as bandlike pain under her breasts, usually with minimal exertion, or bending over, associated with diaphoresis and mild dyspnea, and worsening fatigue.  Cardiovascular risk factors include hypertension, hyperlipidemia, age. Will plan Lexiscan Myoview diagnostic purposes. She is unable to walk on treadmill due to pain in her knees and ankles.  2. Permanent atrial fibrillation: Heart rate is well controlled currently, she remains compliant with ELIQUIS, diltiazem, and metoprolol. He is requesting assistance with ELIQUIS due to cost. Patient assistance form is filled out and will be faxed today.  3. Hypertension: Blood pressure is currently well-controlled. No changes in her medication  regimen at this time.  4. Hypercholesterolemia: She is tolerating Crestor without similar reaction she had to Lipitor which was a wise rash. She is wondering if her leg and knee along with ankle pain may be related to Crestor as she is aware that he can cause myalgias. Advised her to stop Crestor from by me he changes anything. I doubt what she is experiencing myalgia. Advised her to return to taking Crestor after 5 days if there is no change in her musculoskeletal pain.  Current medicines are reviewed at length with the patient today.  I spent greater than 40 minutes with this patient answering multiple questions medications and examination. Also filling out paperwork for patient assistance for ELIQUIS.  Labs/ tests ordered today include: Lexiscan Myoview, also check BMET, CBC, TSH, and hemoglobin A1c as no lancinating completed in greater than 18 months.   Phill Myron. West Pugh, ANP, AACC   11/23/2017 12:41 PM    [facility] [address] [place], [place] [address] Phone: [phone]; Fax: [phone]

## 2017-11-23 ENCOUNTER — Encounter: Payer: Self-pay | Admitting: Adult Health

## 2017-11-23 ENCOUNTER — Ambulatory Visit (INDEPENDENT_AMBULATORY_CARE_PROVIDER_SITE_OTHER): Payer: Medicare Other | Admitting: Adult Health

## 2017-11-23 VITALS — BP 124/84 | HR 80 | Ht 64.5 in | Wt 192.0 lb

## 2017-11-23 DIAGNOSIS — D649 Anemia, unspecified: Secondary | ICD-10-CM | POA: Diagnosis not present

## 2017-11-23 DIAGNOSIS — R079 Chest pain, unspecified: Secondary | ICD-10-CM | POA: Diagnosis not present

## 2017-11-23 DIAGNOSIS — I10 Essential (primary) hypertension: Secondary | ICD-10-CM | POA: Diagnosis not present

## 2017-11-23 DIAGNOSIS — E785 Hyperlipidemia, unspecified: Secondary | ICD-10-CM | POA: Diagnosis not present

## 2017-11-23 DIAGNOSIS — Z79899 Other long term (current) drug therapy: Secondary | ICD-10-CM | POA: Diagnosis not present

## 2017-11-23 NOTE — Patient Instructions (Signed)
Medication Instructions:  NO CHANGES-Your physician recommends that you continue on your current medications as directed. Please refer to the Current Medication list given to you today.  If you need a refill on your cardiac medications before your next appointment, please call your pharmacy.  Labwork: TSH,CBC,FLP,LFT AND A1C HERE IN OUR OFFICE AT [facility]  * You will need to fast. DO NOT EAT OR DRINK PAST MIDNIGHT.   Testing/Procedures: Your physician has requested that you have a lexiscan myoview.A cardiac stress test is a cardiological test that measures the heart's ability to respond to external stress in a controlled clinical environment. The stress response is induced byintravenous pharmacological stimulation. For further information please visit [email or web address]. Please follow instruction sheet, as given.  Follow-Up: Your physician wants you to follow-up in: AFTER TESTING WITH DR CRENSHAW ONLY.    Thank you for choosing [facility] at [facility]!!

## 2017-11-24 LAB — LIPID PANEL
CHOL/HDL RATIO: 4.8 ratio — AB (ref 0.0–4.4)
Cholesterol, Total: 178 mg/dL (ref 100–199)
HDL: 37 mg/dL — ABNORMAL LOW (ref >39)
LDL Calculated: 111 mg/dL — ABNORMAL HIGH (ref 0–99)
TRIGLYCERIDES: 148 mg/dL (ref 0–149)
VLDL Cholesterol Cal: 30 mg/dL (ref 5–40)

## 2017-11-24 LAB — CBC
HEMOGLOBIN: 13 g/dL (ref 11.1–15.9)
Hematocrit: 39.2 % (ref 34.0–46.6)
MCH: 30 pg (ref 26.6–33.0)
MCHC: 33.2 g/dL (ref 31.5–35.7)
MCV: 90 fL (ref 79–97)
Platelets: 316 10*3/uL (ref 150–379)
RBC: 4.34 x10E6/uL (ref 3.77–5.28)
RDW: 13.7 % (ref 12.3–15.4)
WBC: 8.2 10*3/uL (ref 3.4–10.8)

## 2017-11-24 LAB — TSH: TSH: 1.76 u[IU]/mL (ref 0.450–4.500)

## 2017-11-24 LAB — HEPATIC FUNCTION PANEL
ALBUMIN: 4.6 g/dL (ref 3.6–4.8)
ALK PHOS: 50 IU/L (ref 39–117)
ALT: 21 IU/L (ref 0–32)
AST: 20 IU/L (ref 0–40)
BILIRUBIN TOTAL: 0.7 mg/dL (ref 0.0–1.2)
BILIRUBIN, DIRECT: 0.16 mg/dL (ref 0.00–0.40)
Total Protein: 7 g/dL (ref 6.0–8.5)

## 2017-11-24 LAB — HEMOGLOBIN A1C
Est. average glucose Bld gHb Est-mCnc: 117 mg/dL
HEMOGLOBIN A1C: 5.7 % — AB (ref 4.8–5.6)

## 2017-11-29 ENCOUNTER — Other Ambulatory Visit: Payer: Self-pay

## 2017-11-29 DIAGNOSIS — Z79899 Other long term (current) drug therapy: Secondary | ICD-10-CM

## 2017-11-29 DIAGNOSIS — E785 Hyperlipidemia, unspecified: Secondary | ICD-10-CM

## 2017-11-30 ENCOUNTER — Telehealth (HOSPITAL_COMMUNITY): Payer: Self-pay

## 2017-11-30 NOTE — Telephone Encounter (Signed)
Encounter complete. 

## 2017-12-02 ENCOUNTER — Ambulatory Visit (HOSPITAL_COMMUNITY)
Admission: RE | Admit: 2017-12-02 | Discharge: 2017-12-02 | Disposition: A | Discharge status: Home / Self Care | Payer: Medicare Other | Source: Ambulatory Visit | Attending: Cardiovascular Disease | Admitting: Cardiovascular Disease

## 2017-12-02 DIAGNOSIS — I10 Essential (primary) hypertension: Secondary | ICD-10-CM | POA: Diagnosis not present

## 2017-12-02 DIAGNOSIS — R079 Chest pain, unspecified: Secondary | ICD-10-CM | POA: Diagnosis not present

## 2017-12-02 LAB — MYOCARDIAL PERFUSION IMAGING
CHL CUP RESTING HR STRESS: 78 {beats}/min
CSEPPHR: 112 {beats}/min
LV sys vol: 27 mL
LVDIAVOL: 74 mL (ref 46–106)
SDS: 4
SRS: 0
SSS: 4
TID: 1.21

## 2017-12-02 MED ORDER — TECHNETIUM TC 99M TETROFOSMIN IV KIT
10.0000 | PACK | Freq: Once | INTRAVENOUS | Status: AC | PRN
  Administered 2017-12-02: 10 via INTRAVENOUS
  Filled 2017-12-02: qty 10

## 2017-12-02 MED ORDER — TECHNETIUM TC 99M TETROFOSMIN IV KIT
30.6000 | PACK | Freq: Once | INTRAVENOUS | Status: AC | PRN
  Administered 2017-12-02: 30.6 via INTRAVENOUS
  Filled 2017-12-02: qty 31

## 2017-12-02 MED ORDER — AMINOPHYLLINE 25 MG/ML IV SOLN
75.0000 mg | Freq: Once | INTRAVENOUS | Status: AC
  Administered 2017-12-02: 75 mg via INTRAVENOUS

## 2017-12-02 MED ORDER — REGADENOSON 0.4 MG/5ML IV SOLN
0.4000 mg | Freq: Once | INTRAVENOUS | Status: AC
  Administered 2017-12-02: 0.4 mg via INTRAVENOUS

## 2017-12-06 ENCOUNTER — Telehealth: Payer: Self-pay | Admitting: Adult Health

## 2017-12-06 ENCOUNTER — Ambulatory Visit: Payer: Medicare Other | Admitting: Internal Medicine

## 2017-12-06 NOTE — Telephone Encounter (Signed)
Follow UP:    Returning your call from this morning,concerning Stress Test results.

## 2017-12-07 ENCOUNTER — Other Ambulatory Visit: Payer: Self-pay

## 2017-12-07 MED ORDER — ROSUVASTATIN CALCIUM 20 MG PO TABS: 20.0000 mg | ORAL_TABLET | Freq: Every day | ORAL | 5 refills | Status: DC

## 2017-12-10 ENCOUNTER — Ambulatory Visit (INDEPENDENT_AMBULATORY_CARE_PROVIDER_SITE_OTHER): Payer: Medicare Other | Admitting: Internal Medicine

## 2017-12-10 ENCOUNTER — Encounter: Payer: Self-pay | Admitting: Internal Medicine

## 2017-12-10 VITALS — BP 130/84 | HR 80

## 2017-12-10 DIAGNOSIS — E538 Deficiency of other specified B group vitamins: Secondary | ICD-10-CM

## 2017-12-10 DIAGNOSIS — R208 Other disturbances of skin sensation: Secondary | ICD-10-CM | POA: Diagnosis not present

## 2017-12-10 DIAGNOSIS — Z1321 Encounter for screening for nutritional disorder: Secondary | ICD-10-CM | POA: Diagnosis not present

## 2017-12-10 DIAGNOSIS — Z79899 Other long term (current) drug therapy: Secondary | ICD-10-CM | POA: Diagnosis not present

## 2017-12-11 LAB — VITAMIN B12: VITAMIN B 12: 516 pg/mL (ref 200–1100)

## 2017-12-11 LAB — FOLATE: Folate: 12.1 ng/mL

## 2017-12-20 NOTE — Patient Instructions (Signed)
Mouthwash 2 teaspoons p.o. swish did not swallow 4 times daily.  B12 and folate levels drawn.

## 2017-12-20 NOTE — Progress Notes (Signed)
   Subjective:    Patient ID: Olivia Reynolds, female    DOB: 10-19-1950, 67 y.o.   MRN: [id number]  HPI Patient has encountered burning sensation in her mouth now  for several weeks.  She saw her dentist.  He raised the possibility of a vitamin deficiency.  He really could not detect any lesions in her mouth that were of concern.  Patient denies being under excessive stress.  Has never had this sort of issue before.  GE reflux has been an issue for many years.    Review of Systems see above     Objective:   Physical Exam  I do not appreciate any significant oral lesions.  Tongue appears to be within normal limits.      Assessment & Plan:  ?  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  Rule out vitamin deficiencies  Plan: Patient will have B12 and folate levels checked.  We will try Magic mouthwash 2 teaspoons swish but do not swallow 4 times daily as needed.  Addendum: B12 and folate levels are normal.

## 2017-12-27 ENCOUNTER — Telehealth: Payer: Self-pay | Admitting: *Deleted

## 2017-12-27 ENCOUNTER — Telehealth: Payer: Self-pay | Admitting: Cardiology

## 2017-12-27 MED ORDER — APIXABAN 5 MG PO TABS: 5.0000 mg | ORAL_TABLET | Freq: Two times a day (BID) | ORAL | 0 refills | Status: DC

## 2017-12-27 NOTE — Telephone Encounter (Signed)
ERROR

## 2017-12-27 NOTE — Telephone Encounter (Signed)
New message    Was in office 10/2017  *STAT* If patient is at the pharmacy, call can be transferred to refill team.   1. Which medications need to be refilled? (please list name of each medication and dose if known)   apixaban (ELIQUIS) 5 MG TABS tablet Take 1 tablet (5 mg total) by mouth 2 (two) times daily. CARDIOLOGIST FOLLOW UP NEEDED PRIOR TO NEXT REFILL AUTHORIZATION        2. Which pharmacy/location (including street and city if local pharmacy) is medication to be sent to? [facility]  3. Do they need a 30 day or 90 day supply? [place]

## 2017-12-28 ENCOUNTER — Telehealth: Payer: Self-pay | Admitting: Cardiology

## 2017-12-28 ENCOUNTER — Other Ambulatory Visit: Payer: Self-pay | Admitting: Pharmacist

## 2017-12-28 ENCOUNTER — Telehealth: Payer: Self-pay | Admitting: *Deleted

## 2017-12-28 MED ORDER — METOPROLOL SUCCINATE ER 25 MG PO TB24: 25.0000 mg | ORAL_TABLET | Freq: Every day | ORAL | 3 refills | Status: DC

## 2017-12-28 MED ORDER — APIXABAN 5 MG PO TABS: 5.0000 mg | ORAL_TABLET | Freq: Two times a day (BID) | ORAL | 0 refills | Status: DC

## 2017-12-28 NOTE — Telephone Encounter (Signed)
Follow up   Patient calling back regarding apixaban (ELIQUIS) 5 MG TABS tablet refill from 12/27/2017. Patient states medication should be filled for more than 30 tablets. She takes 2 a day.

## 2017-12-28 NOTE — Telephone Encounter (Signed)
Rx sent to prefer pharmacy for 90 days (no refills)

## 2017-12-28 NOTE — Telephone Encounter (Signed)
New message     *STAT* If patient is at the pharmacy, call can be transferred to refill team.   1. Which medications need to be refilled? (please list name of each medication and dose if known) metoprolol succinate (TOPROL-XL) 25 MG 24 hr tablet   2. Which pharmacy/location (including street and city if local pharmacy) is medication to be sent to?[facility] [place] [place] - [address] AT [place]  3. Do they need a 30 day or 90 day supply? [place]

## 2017-12-28 NOTE — Telephone Encounter (Signed)
Refill for eliquis sent yesterday to prefer pharmacy

## 2017-12-28 NOTE — Telephone Encounter (Signed)
ERROR

## 2017-12-29 ENCOUNTER — Ambulatory Visit (INDEPENDENT_AMBULATORY_CARE_PROVIDER_SITE_OTHER): Payer: Medicare Other | Admitting: Nurse Practitioner

## 2017-12-29 ENCOUNTER — Other Ambulatory Visit: Payer: Self-pay | Admitting: Nurse Practitioner

## 2017-12-29 ENCOUNTER — Encounter (INDEPENDENT_AMBULATORY_CARE_PROVIDER_SITE_OTHER): Payer: Self-pay

## 2017-12-29 ENCOUNTER — Encounter: Payer: Self-pay | Admitting: Nurse Practitioner

## 2017-12-29 VITALS — BP 116/80 | HR 84 | Ht 64.5 in | Wt 195.0 lb

## 2017-12-29 DIAGNOSIS — K219 Gastro-esophageal reflux disease without esophagitis: Secondary | ICD-10-CM

## 2017-12-29 DIAGNOSIS — R1310 Dysphagia, unspecified: Secondary | ICD-10-CM

## 2017-12-29 DIAGNOSIS — I480 Paroxysmal atrial fibrillation: Secondary | ICD-10-CM

## 2017-12-29 NOTE — Progress Notes (Addendum)
IMPRESSION and PLAN:    29. 67 year old female with GERD / hx of Nissen fundoplication presenting with recurrent solid food dysphagia just a few months after dilation of a mild esophageal stenosis.  A 5 cm hiatal hernia was found at time of EGD but no other abnormalities. Given only transient improvement in dysphagia wonder about a dysmotility disorder or maybe some anatomic abnormality not appreciated on EGD.  -will schedule patient for EGD with tablet for further evaluation.  -Advised patient to eat small bites, chew well with liquids in between bites to avoid food impaction.  2. Hx of colon polyps. Needs chart reviews regarding timing of follow up colonoscopy. One of the polypys removed at time of colonoscopy 5 years ago was not purely hyperplastic.    Agree with Ms. Guenther's assessment and plan.  Ba swallow showed persistence of residual hiatal hernia and dysmotility I think she will need esophageal manometry  Gatha Mayer, MD, [facility]    HPI:    Chief Complaint: recurrent swallowing problems.    Patient is a 67 year old female with hx of AFib, on Eliquis. She is known to Dr. Carlean Purl.  She was evaluated in October 2018 for evaluation of dysphagia. In November she underwent EGD with balloon dilation of a mild esophageal stenosis. Nissen fundoplication wrap found intact, a 5 cm hiatal hernia was found. Post dilation she had significant improvement in swallowing, now back with recurrent dysphagia.  She is avoiding chicken, bread and several other items.  She takes breaks during meals to let food go down.  Sometimes the food will not go down and she has to belch to regurgitate the food back into her mouth.  In the evening she often vomits undigested food.  No associated nausea just sudden vomiting of the food.  She takes a PPI, heartburn fairly well controlled. Sometimes gets a pain in chest when leaning over but this chronic, intermittent and unchanged  Patient complains of  numbness at the tip of her tongue.  She has seen a dentist and exam was apparently normal.  PCP evaluated, B12 was okay.   Review of systems:   No SOB, no urinary sx, no weight loss    Past Medical History:  Diagnosis Date  . Chronic headaches   . Depression   . Diastolic dysfunction    a. 12/2013 Echo: EF 60-65%, Gr1 DD, Ao sclerosis w/o stenosis, triv MR, mod dil LA, mild TR.  . Diverticulosis   . GERD (gastroesophageal reflux disease)   . History of hiatal hernia    with repair  . History of stress test    a. 10/2008 MV: Ef 75%, nl perfusion.  Marland Kitchen HTN (hypertension)   . Hyperlipidemia   . Obesity   . PAF (paroxysmal atrial fibrillation) ([place])    a. Anticoagulated w/ Eliquis (CHA2DS2VASc = 3).    Patient's surgical history, family medical history, social history, medications and allergies were all reviewed in Epic    Physical Exam:     BP 116/80   Pulse 84   Ht 5' 4.5" (1.638 m)   Wt 195 lb (88.5 kg)   BMI 32.95 kg/m   GENERAL:  Well developed white female in NAD PSYCH: :Pleasant, cooperative, normal affect EENT:  conjunctiva pink, mucous membranes moist, neck supple without masses CARDIAC:  RRR, no murmur heard, no peripheral edema PULM: Normal respiratory effort, lungs CTA bilaterally, no wheezing ABDOMEN:  Nondistended, soft, nontender. No obvious masses, no hepatomegaly,  normal bowel  sounds SKIN:  turgor, no lesions seen Musculoskeletal:  Normal muscle tone, normal strength NEURO: Alert and oriented x 3, no focal neurologic deficits   Tye Savoy , NP 12/29/2017, 10:10 AM

## 2017-12-29 NOTE — Telephone Encounter (Signed)
REFILL 

## 2017-12-29 NOTE — Patient Instructions (Signed)
If you are age 67 or older, your body mass index should be between 23-30. Your Body mass index is 32.95 kg/m. If this is out of the aforementioned range listed, please consider follow up with your Primary Care Provider.  If you are age 76 or younger, your body mass index should be between 19-25. Your Body mass index is 32.95 kg/m. If this is out of the aformentioned range listed, please consider follow up with your Primary Care Provider.   We will call you in a couple of days.  Thank you for choosing me and [facility].   Tye Savoy, NP.

## 2017-12-31 ENCOUNTER — Telehealth: Payer: Self-pay

## 2017-12-31 ENCOUNTER — Encounter: Payer: Self-pay | Admitting: Nurse Practitioner

## 2017-12-31 NOTE — Telephone Encounter (Signed)
Left message on mobile phone for patient to call with regarding an appt  For barium swallow with tablet gm

## 2017-12-31 NOTE — Telephone Encounter (Signed)
-----   Message from Olivia Craze, NP sent at 12/31/2017  9:13 AM EDT ----- Will you please call patient and tell her that I spoke with Dr. Carlean Purl and he agrees with getting a barium swallow with tablet for further evaluation. Please schedule for her. Thanks.

## 2018-01-05 ENCOUNTER — Other Ambulatory Visit: Payer: Self-pay

## 2018-01-05 ENCOUNTER — Telehealth: Payer: Self-pay

## 2018-01-05 DIAGNOSIS — R1310 Dysphagia, unspecified: Secondary | ICD-10-CM

## 2018-01-05 NOTE — Telephone Encounter (Signed)
Scheduled at [facility] for 01/12/18 at 10 am NPO 3 hrs before procedure. Pt aware.  Pt verbalized understanding of procedure.

## 2018-01-05 NOTE — Telephone Encounter (Signed)
-----   Message from Willia Craze, NP sent at 12/31/2017  9:13 AM EDT ----- Will you please call patient and tell her that I spoke with Dr. Carlean Purl and he agrees with getting a barium swallow with tablet for further evaluation. Please schedule for her. Thanks.

## 2018-01-07 ENCOUNTER — Telehealth: Payer: Self-pay | Admitting: Nurse Practitioner

## 2018-01-07 NOTE — Telephone Encounter (Signed)
The patient ate cheesecake last night. She "vomited for 5 hours" last night. Today she feels tired and has a headache. No fever. Diarrhea. Not more than 3 stools. She declines ER visit. She wants to try rehydrating at home. Able to keep fluids down today. Able to keep mashed potatoes down. She will call if she acutely worsens or go to ER. No change in imaging appointment.

## 2018-01-11 NOTE — Telephone Encounter (Signed)
I spoke with the patient.  She modified her diet (soft, bland) for several days and is feeling much better now.  Will keep imaging appointment tomorrow.

## 2018-01-11 NOTE — Telephone Encounter (Signed)
Ok, Olivia Reynolds will you check on [name] and make sure she improved. Thanks

## 2018-01-12 ENCOUNTER — Other Ambulatory Visit (HOSPITAL_COMMUNITY): Payer: Medicare Other

## 2018-01-12 ENCOUNTER — Telehealth: Payer: Self-pay | Admitting: Nurse Practitioner

## 2018-01-12 NOTE — Telephone Encounter (Signed)
Moved to 01/20/18 at 10:00 am. Patient is notified.

## 2018-01-20 ENCOUNTER — Ambulatory Visit (HOSPITAL_COMMUNITY)
Admission: RE | Admit: 2018-01-20 | Discharge: 2018-01-20 | Disposition: A | Discharge status: Home / Self Care | Payer: Medicare Other | Source: Ambulatory Visit | Attending: Nurse Practitioner | Admitting: Nurse Practitioner

## 2018-01-20 DIAGNOSIS — K219 Gastro-esophageal reflux disease without esophagitis: Secondary | ICD-10-CM | POA: Insufficient documentation

## 2018-01-20 DIAGNOSIS — K449 Diaphragmatic hernia without obstruction or gangrene: Secondary | ICD-10-CM | POA: Insufficient documentation

## 2018-01-20 DIAGNOSIS — R1310 Dysphagia, unspecified: Secondary | ICD-10-CM

## 2018-01-25 NOTE — Progress Notes (Signed)
I left Irem a message to call me back on cell or I would call her about this  I am not sure the hernia is much different than in past (after the surgery)

## 2018-01-27 ENCOUNTER — Other Ambulatory Visit: Payer: Self-pay

## 2018-01-27 ENCOUNTER — Telehealth: Payer: Self-pay | Admitting: Nurse Practitioner

## 2018-01-27 DIAGNOSIS — R1310 Dysphagia, unspecified: Secondary | ICD-10-CM

## 2018-01-27 DIAGNOSIS — K219 Gastro-esophageal reflux disease without esophagitis: Secondary | ICD-10-CM

## 2018-01-27 NOTE — Telephone Encounter (Signed)
Spoke with patient. She agrees to the esophageal manometry. Scheduled for 02/09/18 at 10:30 am. Arrive at 10:15 am. Npo for 4 hours prior.

## 2018-01-27 NOTE — Progress Notes (Signed)
I spoke to the patient about her problems  Plans  1) schedule esophageal manometry re: dysphagia and esophageal dysmotility 2) tell her we will hold off on colonoscopy for time being - I can explain but my plan was to think about one 5 years after the last - do not feel it is a pressing issue

## 2018-02-02 ENCOUNTER — Other Ambulatory Visit: Payer: Self-pay | Admitting: Internal Medicine

## 2018-02-09 ENCOUNTER — Ambulatory Visit (HOSPITAL_COMMUNITY): Admission: RE | Admit: 2018-02-09 | Payer: Medicare Other | Source: Ambulatory Visit | Admitting: Gastroenterology

## 2018-02-09 ENCOUNTER — Telehealth: Payer: Self-pay | Admitting: Nurse Practitioner

## 2018-02-09 ENCOUNTER — Encounter (HOSPITAL_COMMUNITY): Admission: RE | Payer: Self-pay | Source: Ambulatory Visit

## 2018-02-09 SURGERY — MANOMETRY, ESOPHAGUS

## 2018-02-09 NOTE — Progress Notes (Addendum)
Patient did not show for esophageal manometry.  Patient contacted and stated "I called the office at about 9 o'clock and thought they would call you.  I woke up with a cough and a problem with my sinuses.  Someone from the office will call me back to reschedule."  Dr. Carlean Purl made aware.

## 2018-02-09 NOTE — Telephone Encounter (Signed)
Left patient a message with new date and time for esophageal manometry 02/16/18 at 10:30.

## 2018-02-17 ENCOUNTER — Other Ambulatory Visit: Payer: Self-pay | Admitting: Internal Medicine

## 2018-02-17 NOTE — Telephone Encounter (Signed)
Refill x 90 days 

## 2018-02-23 ENCOUNTER — Ambulatory Visit (HOSPITAL_COMMUNITY)
Admission: RE | Admit: 2018-02-23 | Discharge: 2018-02-23 | Disposition: A | Discharge status: Home / Self Care | Payer: Medicare Other | Source: Ambulatory Visit | Attending: Gastroenterology | Admitting: Gastroenterology

## 2018-02-23 ENCOUNTER — Encounter (HOSPITAL_COMMUNITY)
Admission: RE | Disposition: A | Discharge status: Home / Self Care | Payer: Self-pay | Source: Ambulatory Visit | Attending: Gastroenterology

## 2018-02-23 DIAGNOSIS — K219 Gastro-esophageal reflux disease without esophagitis: Secondary | ICD-10-CM | POA: Insufficient documentation

## 2018-02-23 DIAGNOSIS — R1310 Dysphagia, unspecified: Secondary | ICD-10-CM

## 2018-02-23 DIAGNOSIS — R079 Chest pain, unspecified: Secondary | ICD-10-CM | POA: Diagnosis not present

## 2018-02-23 HISTORY — PX: ESOPHAGEAL MANOMETRY: SHX5429

## 2018-02-23 SURGERY — MANOMETRY, ESOPHAGUS

## 2018-02-23 MED ORDER — LIDOCAINE VISCOUS HCL 2 % MT SOLN
OROMUCOSAL | Status: AC
  Filled 2018-02-23: qty 15

## 2018-02-23 SURGICAL SUPPLY — 2 items
FACESHIELD LNG OPTICON STERILE (SAFETY) IMPLANT
GLOVE BIO SURGEON STRL SZ8 (GLOVE) ×4 IMPLANT

## 2018-02-23 NOTE — Progress Notes (Signed)
Esophageal manometry preformed per protocol.  Patient tolerated procedure without complications.  Results to be read by Dr. Silverio Decamp.

## 2018-02-24 ENCOUNTER — Ambulatory Visit: Payer: Medicare Other | Admitting: Cardiology

## 2018-02-24 ENCOUNTER — Encounter (HOSPITAL_COMMUNITY): Payer: Self-pay | Admitting: Gastroenterology

## 2018-02-25 DIAGNOSIS — R1310 Dysphagia, unspecified: Secondary | ICD-10-CM

## 2018-03-24 ENCOUNTER — Telehealth: Payer: Self-pay

## 2018-03-24 NOTE — Telephone Encounter (Signed)
Left message to call back needs to schedule CPE.

## 2018-04-29 ENCOUNTER — Other Ambulatory Visit: Payer: Self-pay

## 2018-05-09 ENCOUNTER — Ambulatory Visit: Payer: Medicare Other | Admitting: Cardiology

## 2018-05-20 ENCOUNTER — Other Ambulatory Visit: Payer: Self-pay | Admitting: Cardiology

## 2018-05-20 ENCOUNTER — Other Ambulatory Visit: Payer: Self-pay | Admitting: Internal Medicine

## 2018-06-11 ENCOUNTER — Other Ambulatory Visit: Payer: Self-pay | Admitting: Internal Medicine

## 2018-06-16 ENCOUNTER — Telehealth: Payer: Self-pay

## 2018-06-16 NOTE — Telephone Encounter (Signed)
Left message to call back she is over due for a physical.

## 2018-06-28 NOTE — Progress Notes (Deleted)
HPI: FU PAF. Monitor in June 2015 showed sinus rhythm. Last echo 4/18 showed normal LV function, mild DD, trace AI and MR and mild TR. Nuclear study 3/19 showed EF 63 with probable shifting breast attenuation. Since I last saw her,   Current Outpatient Medications  Medication Sig Dispense Refill  . acetaminophen (TYLENOL) 500 MG tablet Take 500 mg by mouth every 6 (six) hours as needed.    . diazepam (VALIUM) 10 MG tablet TAKE 1 TABLET BY MOUTH AT BEDTIME AS NEEDED FOR SLEEP 30 tablet 2  . diltiazem (CARDIZEM CD) 240 MG 24 hr capsule Take 1 capsule (240 mg total) by mouth daily. KEEP OV. 30 capsule 5  . ELIQUIS 5 MG TABS tablet TAKE 1 TABLET(5 MG) BY MOUTH TWICE DAILY 180 tablet 0  . metoprolol succinate (TOPROL-XL) 25 MG 24 hr tablet Take 1 tablet (25 mg total) by mouth daily. Take with or immediately following a meal. 90 tablet 3  . pantoprazole (PROTONIX) 40 MG tablet Take 1 tablet (40 mg total) daily before breakfast by mouth. 90 tablet 3  . Polyethyl Glycol-Propyl Glycol (SYSTANE OP) Apply 1 drop to eye daily as needed (for dry eyes).     . rosuvastatin (CRESTOR) 20 MG tablet Take 1 tablet (20 mg total) by mouth daily. 30 tablet 5  . sertraline (ZOLOFT) 50 MG tablet TAKE 1 TABLET(50 MG) BY MOUTH DAILY 90 tablet 0   No current facility-administered medications for this visit.      Past Medical History:  Diagnosis Date  . Chronic headaches   . Depression   . Diastolic dysfunction    a. 12/2013 Echo: EF 60-65%, Gr1 DD, Ao sclerosis w/o stenosis, triv MR, mod dil LA, mild TR.  . Diverticulosis   . GERD (gastroesophageal reflux disease)   . History of hiatal hernia    with repair  . History of stress test    a. 10/2008 MV: Ef 75%, nl perfusion.  Marland Kitchen HTN (hypertension)   . Hyperlipidemia   . Obesity   . PAF (paroxysmal atrial fibrillation) ([place])    a. Anticoagulated w/ Eliquis (CHA2DS2VASc = 3).    Past Surgical History:  Procedure Laterality Date  . CATARACT EXTRACTION,  BILATERAL    . COLONOSCOPY    . ELBOW SURGERY  left   Tendon Surgery  . ESOPHAGEAL MANOMETRY N/A 02/23/2018   Procedure: ESOPHAGEAL MANOMETRY (EM);  Surgeon: Mauri Pole, MD;  Location: [facility];  Service: Endoscopy;  Laterality: N/A;  . EYE SURGERY Left    L-yey myopia  . HERNIA REPAIR  5188   nissan fundiplication  . KNEE ARTHROSCOPY  2005   R-knee  . LAPAROSCOPIC NISSEN FUNDOPLICATION    . ROTATOR CUFF REPAIR Right   . TONSILLECTOMY    . TUBAL LIGATION    . UPPER GASTROINTESTINAL ENDOSCOPY      Social History   Socioeconomic History  . Marital status: Legally Separated    Spouse name: Not on file  . Number of children: Not on file  . Years of education: Not on file  . Highest education level: Not on file  Occupational History  . Occupation: [employer] [place]  . Financial resource strain: Not on file  . Food insecurity:    Worry: Not on file    Inability: Not on file  . Transportation needs:    Medical: Not on file    Non-medical: Not on file  Tobacco Use  .  Smoking status: Former Smoker    Types: Cigarettes    Last attempt to quit: 09/06/1977    Years since quitting: 40.8  . Smokeless tobacco: Never Used  Substance and Sexual Activity  . Alcohol use: Yes    Comment: once a month  . Drug use: No  . Sexual activity: Not on file  Lifestyle  . Physical activity:    Days per week: Not on file    Minutes per session: Not on file  . Stress: Not on file  Relationships  . Social connections:    Talks on phone: Not on file    Gets together: Not on file    Attends religious service: Not on file    Active member of club or organization: Not on file    Attends meetings of clubs or organizations: Not on file    Relationship status: Not on file  . Intimate partner violence:    Fear of current or ex partner: Not on file    Emotionally abused: Not on file    Physically abused: Not on file    Forced sexual activity: Not  on file  Other Topics Concern  . Not on file  Social History Narrative  . Not on file    Family History  Problem Relation Age of Onset  . COPD Mother   . Asthma Mother   . Arthritis Father   . Hypertension Father   . Stroke Father   . Cancer Father   . Prostate cancer Father   . Hypertension Sister   . Diabetes Sister   . Hypertension Brother   . Cancer Sister        UTERINE  . Cancer Son        TESTICULAR  . Crohn's disease Daughter   . Colon cancer Neg Hx   . Esophageal cancer Neg Hx   . Stomach cancer Neg Hx   . Pancreatic cancer Neg Hx   . Liver disease Neg Hx     ROS: no fevers or chills, productive cough, hemoptysis, dysphasia, odynophagia, melena, hematochezia, dysuria, hematuria, rash, seizure activity, orthopnea, PND, pedal edema, claudication. Remaining systems are negative.  Physical Exam: Well-developed well-nourished in no acute distress.  Skin is warm and dry.  HEENT is normal.  Neck is supple.  Chest is clear to auscultation with normal expansion.  Cardiovascular exam is regular rate and rhythm.  Abdominal exam nontender or distended. No masses palpated. Extremities show no edema. neuro grossly intact  ECG- personally reviewed  A/P  1  Kirk Ruths, MD

## 2018-07-04 ENCOUNTER — Ambulatory Visit: Payer: Medicare Other | Admitting: Cardiology

## 2018-07-29 ENCOUNTER — Telehealth: Payer: Self-pay

## 2018-07-29 NOTE — Telephone Encounter (Signed)
LM for patient that she would need to come and leave [place] a sample and be seen.

## 2018-07-29 NOTE — Telephone Encounter (Addendum)
Patient called with complaints of a UTI, she has low back pain more on her left side and low abdomen pain this started around 4am, she hasn't noticed any blood in her urine. She said the pain is so bad that she can hardly walk or drive she denies a fever or chills. She wants to know if you would call in an antibiotic for her, I advised her that she would have to come in but I ask you first.  Call back number [phone].

## 2018-07-29 NOTE — Telephone Encounter (Signed)
Needs OV.  

## 2018-07-29 NOTE — Telephone Encounter (Signed)
Patient called back and said she doesn't want to drive.  Daughter is out on a photo shoot.  Her sons are both out of town.  Her Sister is coming over from [place] to bring her come cranberry pills and some sort of other pill to try.  If that doesn't relieve it, she will go to an Urgent Care tomorrow.  And, if she is not better by Monday, she will call us back.  She said to thank you, Dr. Renold Genta.

## 2018-08-03 ENCOUNTER — Other Ambulatory Visit: Payer: Self-pay | Admitting: *Deleted

## 2018-08-03 MED ORDER — ROSUVASTATIN CALCIUM 20 MG PO TABS: 20.0000 mg | ORAL_TABLET | Freq: Every day | ORAL | 5 refills | Status: DC

## 2018-08-13 DIAGNOSIS — Z7901 Long term (current) use of anticoagulants: Secondary | ICD-10-CM | POA: Insufficient documentation

## 2018-08-13 DIAGNOSIS — R05 Cough: Secondary | ICD-10-CM | POA: Diagnosis not present

## 2018-08-13 DIAGNOSIS — Z79899 Other long term (current) drug therapy: Secondary | ICD-10-CM | POA: Diagnosis not present

## 2018-08-13 DIAGNOSIS — J181 Lobar pneumonia, unspecified organism: Secondary | ICD-10-CM | POA: Diagnosis not present

## 2018-08-13 DIAGNOSIS — R197 Diarrhea, unspecified: Secondary | ICD-10-CM | POA: Diagnosis not present

## 2018-08-13 DIAGNOSIS — Z87891 Personal history of nicotine dependence: Secondary | ICD-10-CM | POA: Insufficient documentation

## 2018-08-13 DIAGNOSIS — N390 Urinary tract infection, site not specified: Secondary | ICD-10-CM | POA: Diagnosis not present

## 2018-08-13 DIAGNOSIS — R51 Headache: Secondary | ICD-10-CM | POA: Diagnosis not present

## 2018-08-13 DIAGNOSIS — J168 Pneumonia due to other specified infectious organisms: Secondary | ICD-10-CM | POA: Diagnosis not present

## 2018-08-13 DIAGNOSIS — I10 Essential (primary) hypertension: Secondary | ICD-10-CM | POA: Diagnosis not present

## 2018-08-13 DIAGNOSIS — R0602 Shortness of breath: Secondary | ICD-10-CM | POA: Diagnosis not present

## 2018-08-13 DIAGNOSIS — R112 Nausea with vomiting, unspecified: Secondary | ICD-10-CM | POA: Insufficient documentation

## 2018-08-13 DIAGNOSIS — R509 Fever, unspecified: Secondary | ICD-10-CM | POA: Diagnosis not present

## 2018-08-14 ENCOUNTER — Emergency Department (HOSPITAL_COMMUNITY)
Admission: EM | Admit: 2018-08-14 | Discharge: 2018-08-14 | Disposition: A | Discharge status: Home / Self Care | Payer: Medicare Other | Attending: Emergency Medicine | Admitting: Emergency Medicine

## 2018-08-14 ENCOUNTER — Other Ambulatory Visit: Payer: Self-pay

## 2018-08-14 ENCOUNTER — Encounter (HOSPITAL_COMMUNITY): Payer: Self-pay | Admitting: Emergency Medicine

## 2018-08-14 ENCOUNTER — Emergency Department (HOSPITAL_COMMUNITY): Payer: Medicare Other

## 2018-08-14 DIAGNOSIS — J181 Lobar pneumonia, unspecified organism: Secondary | ICD-10-CM | POA: Diagnosis not present

## 2018-08-14 DIAGNOSIS — N390 Urinary tract infection, site not specified: Secondary | ICD-10-CM

## 2018-08-14 DIAGNOSIS — J189 Pneumonia, unspecified organism: Secondary | ICD-10-CM

## 2018-08-14 DIAGNOSIS — R0602 Shortness of breath: Secondary | ICD-10-CM | POA: Diagnosis not present

## 2018-08-14 DIAGNOSIS — R509 Fever, unspecified: Secondary | ICD-10-CM

## 2018-08-14 DIAGNOSIS — R05 Cough: Secondary | ICD-10-CM | POA: Diagnosis not present

## 2018-08-14 LAB — CBC
HCT: 38 % (ref 36.0–46.0)
HEMOGLOBIN: 12.1 g/dL (ref 12.0–15.0)
MCH: 30.1 pg (ref 26.0–34.0)
MCHC: 31.8 g/dL (ref 30.0–36.0)
MCV: 94.5 fL (ref 80.0–100.0)
NRBC: 0 % (ref 0.0–0.2)
Platelets: 255 10*3/uL (ref 150–400)
RBC: 4.02 MIL/uL (ref 3.87–5.11)
RDW: 12.6 % (ref 11.5–15.5)
WBC: 8.6 10*3/uL (ref 4.0–10.5)

## 2018-08-14 LAB — URINALYSIS, ROUTINE W REFLEX MICROSCOPIC
BILIRUBIN URINE: NEGATIVE
GLUCOSE, UA: NEGATIVE mg/dL
Ketones, ur: NEGATIVE mg/dL
NITRITE: POSITIVE — AB
Protein, ur: NEGATIVE mg/dL
SPECIFIC GRAVITY, URINE: 1.009 (ref 1.005–1.030)
pH: 6 (ref 5.0–8.0)

## 2018-08-14 LAB — COMPREHENSIVE METABOLIC PANEL
ALT: 24 U/L (ref 0–44)
AST: 23 U/L (ref 15–41)
Albumin: 3.9 g/dL (ref 3.5–5.0)
Alkaline Phosphatase: 44 U/L (ref 38–126)
Anion gap: 9 (ref 5–15)
BILIRUBIN TOTAL: 1.6 mg/dL — AB (ref 0.3–1.2)
BUN: 10 mg/dL (ref 8–23)
CHLORIDE: 107 mmol/L (ref 98–111)
CO2: 22 mmol/L (ref 22–32)
Calcium: 8.8 mg/dL — ABNORMAL LOW (ref 8.9–10.3)
Creatinine, Ser: 0.6 mg/dL (ref 0.44–1.00)
Glucose, Bld: 122 mg/dL — ABNORMAL HIGH (ref 70–99)
POTASSIUM: 3.6 mmol/L (ref 3.5–5.1)
Sodium: 138 mmol/L (ref 135–145)
TOTAL PROTEIN: 7.1 g/dL (ref 6.5–8.1)

## 2018-08-14 LAB — LIPASE, BLOOD: LIPASE: 33 U/L (ref 11–51)

## 2018-08-14 LAB — INFLUENZA PANEL BY PCR (TYPE A & B)
Influenza A By PCR: NEGATIVE
Influenza B By PCR: NEGATIVE

## 2018-08-14 MED ORDER — ONDANSETRON HCL 4 MG/2ML IJ SOLN
4.0000 mg | Freq: Once | INTRAMUSCULAR | Status: AC
  Administered 2018-08-14: 4 mg via INTRAVENOUS
  Filled 2018-08-14: qty 2

## 2018-08-14 MED ORDER — LEVOFLOXACIN 500 MG PO TABS
500.0000 mg | ORAL_TABLET | Freq: Once | ORAL | Status: AC
  Administered 2018-08-14: 500 mg via ORAL
  Filled 2018-08-14: qty 1

## 2018-08-14 MED ORDER — KETOROLAC TROMETHAMINE 15 MG/ML IJ SOLN
15.0000 mg | Freq: Once | INTRAMUSCULAR | Status: AC
  Administered 2018-08-14: 15 mg via INTRAVENOUS
  Filled 2018-08-14: qty 1

## 2018-08-14 MED ORDER — LEVOFLOXACIN 500 MG PO TABS: 500.0000 mg | ORAL_TABLET | Freq: Every day | ORAL | 0 refills | Status: DC

## 2018-08-14 MED ORDER — SODIUM CHLORIDE 0.9 % IV BOLUS
500.0000 mL | Freq: Once | INTRAVENOUS | Status: AC
  Administered 2018-08-14: 500 mL via INTRAVENOUS

## 2018-08-14 MED ORDER — ONDANSETRON 4 MG PO TBDP: 4.0000 mg | ORAL_TABLET | Freq: Three times a day (TID) | ORAL | 0 refills | Status: AC | PRN

## 2018-08-14 NOTE — ED Notes (Signed)
Pt states improvement in symptoms, readiness for DC, understanding of DC instructions.  Pt ambulating well at DC to go home by car.

## 2018-08-14 NOTE — ED Notes (Signed)
Urine culture sent to lab.

## 2018-08-14 NOTE — ED Notes (Signed)
Pt drank about half a cup of water and stopped because she said she felt nauseous.

## 2018-08-14 NOTE — ED Provider Notes (Signed)
[facility] DEPT Provider Note  CSN: [id number] Arrival date & time: 08/13/18 2324  Chief Complaint(s) Emesis and Fever  HPI Olivia Reynolds is a 67 y.o. female   The history is provided by the patient.  Fever   This is a new problem. The current episode started 12 to 24 hours ago. The problem has not changed since onset.The maximum temperature noted was 101 to 101.9 F. Associated symptoms include diarrhea (chronic), vomiting (NBNB), congestion, headaches and muscle aches. She has tried acetaminophen for the symptoms. The treatment provided moderate relief.   Sick contacts, granddaughter.   Reported dysuria couple weeks ago which have subsided.  Past Medical History Past Medical History:  Diagnosis Date  . Chronic headaches   . Depression   . Diastolic dysfunction    a. 12/2013 Echo: EF 60-65%, Gr1 DD, Ao sclerosis w/o stenosis, triv MR, mod dil LA, mild TR.  . Diverticulosis   . GERD (gastroesophageal reflux disease)   . History of hiatal hernia    with repair  . History of stress test    a. 10/2008 MV: Ef 75%, nl perfusion.  Marland Kitchen HTN (hypertension)   . Hyperlipidemia   . Obesity   . PAF (paroxysmal atrial fibrillation) ([place])    a. Anticoagulated w/ Eliquis (CHA2DS2VASc = 3).   Patient Active Problem List   Diagnosis Date Noted  . Dysphagia   . Osteopenia 08/15/2013  . Dyspnea 05/31/2013  . Personal history of colonic polyps- right sided small serrated polyps but no adenomas 09/26/2012  . Musculoskeletal pain 09/04/2012  . Anxiety 09/04/2012  . History of migraine headaches 09/04/2012  . Insomnia 10/25/2011  . Depression 10/25/2011  . PAF (paroxysmal atrial fibrillation) ([place])   . Hyperlipidemia   . HTN (hypertension)   . Obesity    Home Medication(s) Prior to Admission medications   Medication Sig Start Date End Date Taking? Authorizing Provider  acetaminophen (TYLENOL) 500 MG tablet Take 500 mg by mouth every 6 (six) hours as  needed.    [provider]  diazepam (VALIUM) 10 MG tablet TAKE 1 TABLET BY MOUTH AT BEDTIME AS NEEDED FOR SLEEP 06/12/18   Elby Showers, MD  diltiazem (CARDIZEM CD) 240 MG 24 hr capsule Take 1 capsule (240 mg total) by mouth daily. KEEP OV. 12/29/17   Lelon Perla, MD  ELIQUIS 5 MG TABS tablet TAKE 1 TABLET(5 MG) BY MOUTH TWICE DAILY 05/20/18   Lelon Perla, MD  levofloxacin (LEVAQUIN) 500 MG tablet Take 1 tablet (500 mg total) by mouth daily. 08/14/18   Fatima Blank, MD  metoprolol succinate (TOPROL-XL) 25 MG 24 hr tablet Take 1 tablet (25 mg total) by mouth daily. Take with or immediately following a meal. 12/28/17   Crenshaw, Denice Bors, MD  ondansetron (ZOFRAN ODT) 4 MG disintegrating tablet Take 1 tablet (4 mg total) by mouth every 8 (eight) hours as needed for up to 3 days for nausea or vomiting. 08/14/18 08/17/18  Fatima Blank, MD  pantoprazole (PROTONIX) 40 MG tablet Take 1 tablet (40 mg total) daily before breakfast by mouth. 08/11/17   Gatha Mayer, MD  Polyethyl Glycol-Propyl Glycol (SYSTANE OP) Apply 1 drop to eye daily as needed (for dry eyes).     [provider]  rosuvastatin (CRESTOR) 20 MG tablet Take 1 tablet (20 mg total) by mouth daily. 08/03/18   Lelon Perla, MD  sertraline (ZOLOFT) 50 MG tablet TAKE 1 TABLET(50 MG) BY MOUTH DAILY 05/20/18  Elby Showers, MD                                                                                                                                    Past Surgical History Past Surgical History:  Procedure Laterality Date  . CATARACT EXTRACTION, BILATERAL    . COLONOSCOPY    . ELBOW SURGERY  left   Tendon Surgery  . ESOPHAGEAL MANOMETRY N/A 02/23/2018   Procedure: ESOPHAGEAL MANOMETRY (EM);  Surgeon: Mauri Pole, MD;  Location: [facility] ENDOSCOPY;  Service: Endoscopy;  Laterality: N/A;  . EYE SURGERY Left    L-yey myopia  . HERNIA REPAIR  6789   nissan fundiplication  . KNEE  ARTHROSCOPY  2005   R-knee  . LAPAROSCOPIC NISSEN FUNDOPLICATION    . ROTATOR CUFF REPAIR Right   . TONSILLECTOMY    . TUBAL LIGATION    . UPPER GASTROINTESTINAL ENDOSCOPY     Family History Family History  Problem Relation Age of Onset  . COPD Mother   . Asthma Mother   . Arthritis Father   . Hypertension Father   . Stroke Father   . Cancer Father   . Prostate cancer Father   . Hypertension Sister   . Diabetes Sister   . Hypertension Brother   . Cancer Sister        UTERINE  . Cancer Son        TESTICULAR  . Crohn's disease Daughter   . Colon cancer Neg Hx   . Esophageal cancer Neg Hx   . Stomach cancer Neg Hx   . Pancreatic cancer Neg Hx   . Liver disease Neg Hx     Social History Social History   Tobacco Use  . Smoking status: Former Smoker    Types: Cigarettes    Last attempt to quit: 09/06/1977    Years since quitting: 40.9  . Smokeless tobacco: Never Used  Substance Use Topics  . Alcohol use: Yes    Comment: once a month  . Drug use: No   Allergies Erythromycin; Statins; Codeine; Hctz [hydrochlorothiazide]; and Lipitor [atorvastatin]  Review of Systems Review of Systems  Constitutional: Positive for fever.  HENT: Positive for congestion.   Gastrointestinal: Positive for diarrhea (chronic) and vomiting (NBNB).  Neurological: Positive for headaches.   All other systems are reviewed and are negative for acute change except as noted in the HPI  Physical Exam Vital Signs  I have reviewed the triage vital signs BP (!) 146/74 (BP Location: Right Arm)   Pulse (!) 106   Temp 100.2 F (37.9 C) (Oral)   Resp (!) 24   Ht 5\' 5"  (1.651 m)   Wt 83 kg   SpO2 96%   BMI 30.45 kg/m   Physical Exam  Constitutional: She is oriented to person, place, and time. She appears well-developed and well-nourished. No distress.  HENT:  Head: Normocephalic and atraumatic.  Nose:  Mucosal edema and rhinorrhea present.  Mouth/Throat: No posterior oropharyngeal edema,  posterior oropharyngeal erythema or tonsillar abscesses.  Post nasal drip  Eyes: Pupils are equal, round, and reactive to light. Conjunctivae and EOM are normal. Right eye exhibits no discharge. Left eye exhibits no discharge. No scleral icterus.  Neck: Normal range of motion. Neck supple.  Cardiovascular: Normal rate and regular rhythm. Exam reveals no gallop and no friction rub.  No murmur heard. Pulmonary/Chest: Effort normal and breath sounds normal. No stridor. No respiratory distress. She has no rales.  Abdominal: Soft. She exhibits no distension. There is no tenderness.  Musculoskeletal: She exhibits no edema or tenderness.  Neurological: She is alert and oriented to person, place, and time.  Skin: Skin is warm and dry. No rash noted. She is not diaphoretic. No erythema.  Psychiatric: She has a normal mood and affect.  Vitals reviewed.   ED Results and Treatments Labs (all labs ordered are listed, but only abnormal results are displayed) Labs Reviewed  COMPREHENSIVE METABOLIC PANEL - Abnormal; Notable for the following components:      Result Value   Glucose, Bld 122 (*)    Calcium 8.8 (*)    Total Bilirubin 1.6 (*)    All other components within normal limits  URINALYSIS, ROUTINE W REFLEX MICROSCOPIC - Abnormal; Notable for the following components:   Hgb urine dipstick SMALL (*)    Nitrite POSITIVE (*)    Leukocytes, UA SMALL (*)    Bacteria, UA MANY (*)    All other components within normal limits  LIPASE, BLOOD  CBC  INFLUENZA PANEL BY PCR (TYPE A & B)                                                                                                                         EKG  EKG Interpretation  Date/Time:    Ventricular Rate:    PR Interval:    QRS Duration:   QT Interval:    QTC Calculation:   R Axis:     Text Interpretation:        Radiology Dg Chest 2 View  Result Date: 08/14/2018 CLINICAL DATA:  Acute onset of shortness of breath, cough, fever,  headache and chills. EXAM: CHEST - 2 VIEW COMPARISON:  Chest radiograph performed 09/10/2016 FINDINGS: The lungs are well-aerated. Minimal bibasilar opacities may reflect atelectasis or possibly mild pneumonia, depending on the patient's symptoms. There is no evidence of pleural effusion or pneumothorax. The heart is normal in size; the mediastinal contour is within normal limits. No acute osseous abnormalities are seen. A moderate hiatal hernia is again noted. IMPRESSION: 1. Minimal bibasilar opacities may reflect atelectasis or possibly mild pneumonia, depending on the patient's symptoms. 2. Moderate hiatal hernia again noted. Electronically Signed   By: Garald Balding M.D.   On: 08/14/2018 02:14   Pertinent labs & imaging results that were available during my care of the patient were reviewed by me and considered in my medical  decision making (see chart for details).  Medications Ordered in ED Medications  levofloxacin (LEVAQUIN) tablet 500 mg (has no administration in time range)  sodium chloride 0.9 % bolus 500 mL (500 mLs Intravenous New Bag/Given 08/14/18 0229)  ondansetron (ZOFRAN) injection 4 mg (4 mg Intravenous Given 08/14/18 0224)  ketorolac (TORADOL) 15 MG/ML injection 15 mg (15 mg Intravenous Given 08/14/18 0224)                                                                                                                                    Procedures Procedures  (including critical care time)  Medical Decision Making / ED Course I have reviewed the nursing notes for this encounter and the patient's prior records (if available in EHR or on provided paperwork).    Patient presented with influenza-like symptoms but flu swab negative.  Labs reassuring without leukocytosis or anemia.  No significant electrolyte derangements or renal insufficiency.  No evidence of biliary obstruction or pancreatitis.  Abdomen benign on exam.  No need for advanced imaging at this time.  Chest x-ray  with bibasilar opacities suspicious for pneumonia.  Patient is not septic and appropriate for outpatient management.  UA obtained and suspicious for urinary tract infection.  We will treat with Levaquin that will cover both possible infections.  Recommend close follow-up with PCP.  The patient appears reasonably screened and/or stabilized for discharge and I doubt any other medical condition or other [facility] requiring further screening, evaluation, or treatment in the ED at this time prior to discharge.  The patient is safe for discharge with strict return precautions.   Final Clinical Impression(s) / ED Diagnoses Final diagnoses:  Fever  Pneumonia of both lower lobes due to infectious organism [facility])  Acute lower UTI   Disposition: Discharge  Condition: Good  I have discussed the results, Dx and Tx plan with the patient who expressed understanding and agree(s) with the plan. Discharge instructions discussed at great length. The patient was given strict return precautions who verbalized understanding of the instructions. No further questions at time of discharge.    ED Discharge Orders         Ordered    levofloxacin (LEVAQUIN) 500 MG tablet  Daily     08/14/18 0418    ondansetron (ZOFRAN ODT) 4 MG disintegrating tablet  Every 8 hours PRN     08/14/18 0418           Follow Up: Elby Showers, MD [address] [place] [id number] [phone]  Schedule an appointment as soon as possible for a visit  in 3-5 days, If symptoms do not improve or  worsen      This chart was dictated using voice recognition software.  Despite best efforts to proofread,  errors can occur which can change the documentation meaning.   Fatima Blank, MD 08/14/18 [phone]

## 2018-08-14 NOTE — ED Triage Notes (Signed)
Pt reports having vomiting and fever along with chills for the last several days. Pt reports last episode of vomiting at 1500 with 3 episodes yesterday.

## 2018-08-14 NOTE — ED Notes (Signed)
Patient transported to X-ray 

## 2018-08-19 ENCOUNTER — Encounter: Payer: Self-pay | Admitting: Internal Medicine

## 2018-08-19 ENCOUNTER — Ambulatory Visit (INDEPENDENT_AMBULATORY_CARE_PROVIDER_SITE_OTHER): Payer: Medicare Other | Admitting: Internal Medicine

## 2018-08-19 VITALS — BP 140/80 | HR 91 | Temp 98.4°F | Ht 64.5 in | Wt 184.0 lb

## 2018-08-19 DIAGNOSIS — R8290 Unspecified abnormal findings in urine: Secondary | ICD-10-CM

## 2018-08-19 DIAGNOSIS — J181 Lobar pneumonia, unspecified organism: Secondary | ICD-10-CM | POA: Diagnosis not present

## 2018-08-19 DIAGNOSIS — J189 Pneumonia, unspecified organism: Secondary | ICD-10-CM

## 2018-08-19 LAB — POCT URINALYSIS DIPSTICK
APPEARANCE: NORMAL
Bilirubin, UA: NEGATIVE
Blood, UA: NEGATIVE
Glucose, UA: NEGATIVE
KETONES UA: NEGATIVE
Leukocytes, UA: NEGATIVE
Nitrite, UA: NEGATIVE
ODOR: NORMAL
PH UA: 7.5 (ref 5.0–8.0)
Protein, UA: POSITIVE — AB
Spec Grav, UA: 1.01 (ref 1.010–1.025)
Urobilinogen, UA: 0.2 E.U./dL

## 2018-08-19 MED ORDER — LEVOFLOXACIN 250 MG PO TABS: ORAL_TABLET | ORAL | 0 refills | Status: DC

## 2018-08-19 NOTE — Patient Instructions (Addendum)
Take Levaquin 250 mg daily x 5 additional days. Have CXR next week. Rest and drink plenty of fluids.  Will need flu vaccine in 2 weeks and Prevnar 13 in 4 weeks

## 2018-08-19 NOTE — Progress Notes (Signed)
   Subjective:    Patient ID: Olivia Reynolds, female    DOB: 1951-04-11, 67 y.o.   MRN: [id number]  HPI 66 year old Female for follow up of bibasilar pneumonia diagnosed in the emergency department November 17 treated with Levaquin 500 mg daily for 7 days.  Patient has felt weak and washed out most of the week.  Has not been able to help with [employer] activities.  Her former mother-in-law passed away last week and this is been stressful as well.  Continues to have issues with reflux.  Says Dr. Hassell Done does not want to do repeat surgery.  She has had a fundoplication in the remote past.  She was wondering if she could have had aspiration pneumonia.  Has been around grandchildren who are frequently ill.  Thought initially she had a respiratory infection in early November.  Was asked to come to the office but said she did not want to drive or feel up to driving.  Did not seek treatment until November 17 when she drove herself to the emergency department.  She was febrile at the time and was complaining of flulike symptoms with congestion headache and muscle aches with some vomiting.  Was also having issues with diarrhea but reported that is chronic.  Did not have rales on physical exam however chest x-ray showed bibasilar opacities that were minimal and consistent with either atelectasis or possibly mild pneumonia.  Moderate hiatal hernia noted.    Review of Systems has had fatigue this entire week.     Objective:   Physical Exam Skin warm and dry.  Nodes none.  Pharynx very slightly injected.  Right TM slightly full.  Neck is supple.  Chest is clear to auscultation without rales or wheezing.       Assessment & Plan:  Bibasilar pneumonia-patient complaining that Levaquin 500 mg daily may have caused some diarrhea but she has history of diarrhea.  Will decrease Levaquin from 500 to 250 mg daily for an additional 5 days.  She will have a chest x-ray this coming Monday with further instructions to  follow.  Rest and drink plenty of fluids.  In the emergency department November 17 she complained of some UTI symptoms.  Urine specimen showed positive nitrite and small LE along with small hemoglobin.  Many bacteria noted.  No culture was done.  It was presumed she had a UTI she says.  This may be related to her initial complaint early November.  Repeat urine specimen today shows positive protein but no LE and no nitrite  In the emergency department white blood cell count was 8600.  Hemoglobin 12.1 g and platelet count 255,000.  Nonfasting glucose was 122.  Total bilirubin was slightly elevated at 1.6.  Lipase was normal.  PCR flu test for type A and B were both negative.  25 minutes spent with patient including evaluation, reviewing emergency department records, discussing previous treatment in the emergency department and plan treatment today.  Will need flu vaccine in 2 weeks and Prevnar 13 in 4 weeks.

## 2018-08-21 ENCOUNTER — Other Ambulatory Visit: Payer: Self-pay | Admitting: Internal Medicine

## 2018-08-23 ENCOUNTER — Other Ambulatory Visit: Payer: Self-pay | Admitting: Cardiology

## 2018-08-23 DIAGNOSIS — I480 Paroxysmal atrial fibrillation: Secondary | ICD-10-CM

## 2018-08-23 NOTE — Telephone Encounter (Signed)
Rx request sent to pharmacy.  

## 2018-09-01 ENCOUNTER — Other Ambulatory Visit: Payer: Self-pay | Admitting: Internal Medicine

## 2018-09-01 ENCOUNTER — Ambulatory Visit
Admission: RE | Admit: 2018-09-01 | Discharge: 2018-09-01 | Disposition: A | Discharge status: Home / Self Care | Payer: Medicare Other | Source: Ambulatory Visit | Attending: Internal Medicine | Admitting: Internal Medicine

## 2018-09-01 DIAGNOSIS — J189 Pneumonia, unspecified organism: Secondary | ICD-10-CM | POA: Diagnosis not present

## 2018-09-01 DIAGNOSIS — J181 Lobar pneumonia, unspecified organism: Principal | ICD-10-CM

## 2018-09-01 NOTE — Telephone Encounter (Signed)
Patient's chest x-ray was negative.  Because she is still coughing, is she okay to come in tomorrow for a flu shot?  Or, does she need to be treated again for the cough/congestion she still has?

## 2018-09-01 NOTE — Telephone Encounter (Signed)
Patient called earlier and stated that she is still having a terrible cough.  She never went to have the chest x-ray.  Told her she needs to go and have that.  So, she is having CXR today.    She also states that she's having a terrible time with depression at the present time.  She says that you all discussed on her last visit that perhaps the Zoloft 50mg  may not a therapeutic level for her.  She says she's not sure if it's Retirement that has her shook up, the holidays or what it is, but at the present, she is quite anxious and very depressed.  She states she is not suicidal or anything of that nature, just very teary and sad, etc.  She feels she definitely needs more medication to help deal with these feelings that she's having.    States that she has not made any of her family aware of this.  She isn't ready to let them in on these feelings.  She has been keeping these feelings to herself and not expressing it to anyone.    Pharmacy:  [facility] on [address].    Phone:  [phone]  Thank you.

## 2018-09-02 ENCOUNTER — Ambulatory Visit: Payer: Medicare Other | Admitting: Internal Medicine

## 2018-09-02 DIAGNOSIS — J189 Pneumonia, unspecified organism: Secondary | ICD-10-CM

## 2018-09-02 MED ORDER — LEVOFLOXACIN 250 MG PO TABS: ORAL_TABLET | ORAL | 0 refills | Status: DC

## 2018-09-02 MED ORDER — HYDROCODONE-HOMATROPINE 5-1.5 MG/5ML PO SYRP: 5.0000 mL | ORAL_SOLUTION | Freq: Three times a day (TID) | ORAL | 0 refills | Status: DC | PRN

## 2018-09-02 MED ORDER — PREDNISONE 10 MG PO TABS: 10.0000 mg | ORAL_TABLET | Freq: Every day | ORAL | 0 refills | Status: DC

## 2018-09-02 NOTE — Telephone Encounter (Signed)
Patient was notified of Dr. Verlene Mayer recommendations. Prescriptions were e-scribed. She said she would like some cough syrup.

## 2018-09-02 NOTE — Telephone Encounter (Signed)
Please call her and refill levaquin 250 mg daily for 7 days and give Prednisone 10 mg tapering from 60 mg to ) mg over 7 days 6-5-4-3-2-1 #21 tablets.  After taper done, can get flu shot. People cough with pneumonia 3-4 weeks sometimes. Does she need cough syrup?

## 2018-09-05 ENCOUNTER — Other Ambulatory Visit: Payer: Medicare Other | Admitting: Internal Medicine

## 2018-09-09 ENCOUNTER — Telehealth: Payer: Self-pay

## 2018-09-09 DIAGNOSIS — E785 Hyperlipidemia, unspecified: Secondary | ICD-10-CM

## 2018-09-09 DIAGNOSIS — R5383 Other fatigue: Secondary | ICD-10-CM

## 2018-09-09 DIAGNOSIS — I10 Essential (primary) hypertension: Secondary | ICD-10-CM

## 2018-09-12 ENCOUNTER — Other Ambulatory Visit (INDEPENDENT_AMBULATORY_CARE_PROVIDER_SITE_OTHER): Payer: Medicare Other | Admitting: Internal Medicine

## 2018-09-12 ENCOUNTER — Other Ambulatory Visit: Payer: Self-pay | Admitting: Internal Medicine

## 2018-09-12 DIAGNOSIS — Z23 Encounter for immunization: Secondary | ICD-10-CM | POA: Diagnosis not present

## 2018-09-12 DIAGNOSIS — R5383 Other fatigue: Secondary | ICD-10-CM

## 2018-09-12 DIAGNOSIS — I10 Essential (primary) hypertension: Secondary | ICD-10-CM

## 2018-09-12 DIAGNOSIS — E785 Hyperlipidemia, unspecified: Secondary | ICD-10-CM

## 2018-09-15 ENCOUNTER — Encounter: Payer: Medicare Other | Admitting: Internal Medicine

## 2018-09-16 ENCOUNTER — Telehealth: Payer: Self-pay

## 2018-09-16 ENCOUNTER — Encounter: Payer: Medicare Other | Admitting: Internal Medicine

## 2018-09-16 LAB — LIPID PANEL
CHOLESTEROL: 148 mg/dL (ref <200)
HDL: 45 mg/dL — AB (ref >50)
LDL Cholesterol (Calc): 81 mg/dL (calc)
Non-HDL Cholesterol (Calc): 103 mg/dL (calc) (ref <130)
Total CHOL/HDL Ratio: 3.3 (calc) (ref <5.0)
Triglycerides: 128 mg/dL (ref <150)

## 2018-09-16 LAB — COMPREHENSIVE METABOLIC PANEL
AG RATIO: 1.6 (calc) (ref 1.0–2.5)
ALBUMIN MSPROF: 3.9 g/dL (ref 3.6–5.1)
ALKALINE PHOSPHATASE (APISO): 37 U/L (ref 33–130)
ALT: 12 U/L (ref 6–29)
AST: 13 U/L (ref 10–35)
BILIRUBIN TOTAL: 0.9 mg/dL (ref 0.2–1.2)
BUN: 22 mg/dL (ref 7–25)
CO2: 26 mmol/L (ref 20–32)
Calcium: 9.2 mg/dL (ref 8.6–10.4)
Chloride: 105 mmol/L (ref 98–110)
Creat: 0.71 mg/dL (ref 0.50–0.99)
GLOBULIN: 2.4 g/dL (ref 1.9–3.7)
Glucose, Bld: 87 mg/dL (ref 65–99)
POTASSIUM: 3.8 mmol/L (ref 3.5–5.3)
Sodium: 142 mmol/L (ref 135–146)
Total Protein: 6.3 g/dL (ref 6.1–8.1)

## 2018-09-16 LAB — CBC WITH DIFFERENTIAL/PLATELET
Absolute Monocytes: 690 cells/uL (ref 200–950)
BASOS ABS: 60 {cells}/uL (ref 0–200)
Basophils Relative: 0.5 %
EOS PCT: 4 %
Eosinophils Absolute: 476 cells/uL (ref 15–500)
HEMATOCRIT: 37.5 % (ref 35.0–45.0)
Hemoglobin: 12.6 g/dL (ref 11.7–15.5)
LYMPHS ABS: 5617 {cells}/uL — AB (ref 850–3900)
MCH: 30.1 pg (ref 27.0–33.0)
MCHC: 33.6 g/dL (ref 32.0–36.0)
MCV: 89.5 fL (ref 80.0–100.0)
MPV: 9.4 fL (ref 7.5–12.5)
Monocytes Relative: 5.8 %
Neutro Abs: 5058 cells/uL (ref 1500–7800)
Neutrophils Relative %: 42.5 %
PLATELETS: 326 10*3/uL (ref 140–400)
RBC: 4.19 10*6/uL (ref 3.80–5.10)
RDW: 12.7 % (ref 11.0–15.0)
TOTAL LYMPHOCYTE: 47.2 %
WBC: 11.9 10*3/uL — AB (ref 3.8–10.8)

## 2018-09-16 LAB — TSH: TSH: 2.43 m[IU]/L (ref 0.40–4.50)

## 2018-09-16 NOTE — Telephone Encounter (Signed)
Patient called to cancel her physical for this afternoon, she said she has a migraine that she's been fighting since 7am she said she took some tylenol and it didn't do anything for her. She said she will call back to reschedule.

## 2018-09-25 ENCOUNTER — Other Ambulatory Visit: Payer: Self-pay | Admitting: Cardiology

## 2018-09-25 DIAGNOSIS — I480 Paroxysmal atrial fibrillation: Secondary | ICD-10-CM

## 2018-09-29 ENCOUNTER — Telehealth: Payer: Self-pay | Admitting: Internal Medicine

## 2018-09-29 NOTE — Telephone Encounter (Signed)
Called to reschedule her CPE that she had cancelled.  She is sick with a bad head cold that she caught from the grandchildren.  She has coughed so bad for the past 2 nights and hasn't been able to get much sleep.  The cough is bad that she has given herself a migraine.  She has been taking Delsym but it isn't giving her any relief, especially enough to be able to sleep at night.    When she had the bronchitis/pneumonia, you prescribed Hycodan for her but she never picked it up at the pharmacy.  She is wondering if you could prescribe her something that will help to give her some relief while she has this terrible cough?  She said once in a while it will be a little productive, but not much.  No fever, no other symptoms, she said she just has a terrible stuffy head cold with this bad cough.    I told her probably some Tessalon Perrles and she said those don't work for her.  And, if you couldn't call something with a little kick to give her some rest at night, she would just have to keep taking the Delsym.    Thank you.

## 2018-09-29 NOTE — Telephone Encounter (Signed)
She needs to be seen. We are not calling in meds during the flu season as we may miss someone with a severe illness. Would she like to come in today?

## 2018-09-30 NOTE — Telephone Encounter (Signed)
She is coming in next week for her CPE on 1/9.  She does not wish to be seen.

## 2018-10-01 ENCOUNTER — Other Ambulatory Visit: Payer: Self-pay | Admitting: Cardiology

## 2018-10-06 ENCOUNTER — Encounter: Payer: Self-pay | Admitting: Internal Medicine

## 2018-10-06 ENCOUNTER — Ambulatory Visit (INDEPENDENT_AMBULATORY_CARE_PROVIDER_SITE_OTHER): Payer: Medicare Other | Admitting: Internal Medicine

## 2018-10-06 ENCOUNTER — Ambulatory Visit
Admission: RE | Admit: 2018-10-06 | Discharge: 2018-10-06 | Disposition: A | Discharge status: Home / Self Care | Payer: Medicare Other | Source: Ambulatory Visit | Attending: Internal Medicine | Admitting: Internal Medicine

## 2018-10-06 VITALS — BP 110/80 | HR 90 | Temp 98.2°F | Ht 64.5 in | Wt 189.0 lb

## 2018-10-06 DIAGNOSIS — I480 Paroxysmal atrial fibrillation: Secondary | ICD-10-CM | POA: Diagnosis not present

## 2018-10-06 DIAGNOSIS — Z0000 Encounter for general adult medical examination without abnormal findings: Secondary | ICD-10-CM | POA: Diagnosis not present

## 2018-10-06 DIAGNOSIS — R748 Abnormal levels of other serum enzymes: Secondary | ICD-10-CM | POA: Diagnosis not present

## 2018-10-06 DIAGNOSIS — R0989 Other specified symptoms and signs involving the circulatory and respiratory systems: Secondary | ICD-10-CM

## 2018-10-06 DIAGNOSIS — K449 Diaphragmatic hernia without obstruction or gangrene: Secondary | ICD-10-CM

## 2018-10-06 DIAGNOSIS — F5102 Adjustment insomnia: Secondary | ICD-10-CM | POA: Diagnosis not present

## 2018-10-06 DIAGNOSIS — R05 Cough: Secondary | ICD-10-CM | POA: Diagnosis not present

## 2018-10-06 DIAGNOSIS — F439 Reaction to severe stress, unspecified: Secondary | ICD-10-CM

## 2018-10-06 DIAGNOSIS — Z8669 Personal history of other diseases of the nervous system and sense organs: Secondary | ICD-10-CM

## 2018-10-06 DIAGNOSIS — E785 Hyperlipidemia, unspecified: Secondary | ICD-10-CM | POA: Diagnosis not present

## 2018-10-06 DIAGNOSIS — R059 Cough, unspecified: Secondary | ICD-10-CM

## 2018-10-06 DIAGNOSIS — K219 Gastro-esophageal reflux disease without esophagitis: Secondary | ICD-10-CM | POA: Diagnosis not present

## 2018-10-06 DIAGNOSIS — I10 Essential (primary) hypertension: Secondary | ICD-10-CM

## 2018-10-06 MED ORDER — HYDROCODONE-HOMATROPINE 5-1.5 MG/5ML PO SYRP: 5.0000 mL | ORAL_SOLUTION | Freq: Three times a day (TID) | ORAL | 0 refills | Status: DC | PRN

## 2018-10-06 MED ORDER — DIAZEPAM 10 MG PO TABS: 10.0000 mg | ORAL_TABLET | Freq: Every evening | ORAL | 2 refills | Status: DC | PRN

## 2018-10-06 MED ORDER — LEVOFLOXACIN 500 MG PO TABS: 500.0000 mg | ORAL_TABLET | Freq: Every day | ORAL | 0 refills | Status: DC

## 2018-10-06 MED ORDER — CEFTRIAXONE SODIUM 1 G IJ SOLR
1.0000 g | Freq: Once | INTRAMUSCULAR | Status: AC
  Administered 2018-10-06: 1 g via INTRAMUSCULAR

## 2018-10-06 MED ORDER — METOPROLOL SUCCINATE ER 25 MG PO TB24: 25.0000 mg | ORAL_TABLET | Freq: Every day | ORAL | 3 refills | Status: DC

## 2018-10-06 MED ORDER — DILTIAZEM HCL ER COATED BEADS 240 MG PO CP24: 240.0000 mg | ORAL_CAPSULE | Freq: Every day | ORAL | 5 refills | Status: DC

## 2018-10-12 ENCOUNTER — Encounter: Payer: Self-pay | Admitting: Internal Medicine

## 2018-11-09 NOTE — Progress Notes (Signed)
Subjective:    Patient ID: Olivia Reynolds, female    DOB: 30-Mar-1951, 68 y.o.   MRN: [id number]  HPI 68 year old Female in today for health maintenance exam, Medicare wellness exam, and evaluation of medical issues.  Patient was treated in the emergency department August 14, 2018 with bibasilar pneumonia.  She was treated with Levaquin for 7 days.  Continues to have issues with GE reflux.  She has had a fundoplication in the remote past.  There is a question of possible aspiration pneumonia.  Dr. Hassell Done does not want to do repeat surgery.  Had esophageal manometry in May 2019.  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  Had colonoscopy in 2013.  Last upper endoscopy was in 2018.  History of hyperplastic colon polyps.  Repeat study recommended in 5 to 10 years.  Had flu vaccine December 2019.  She is followed by cardiologist for paroxysmal atrial fibrillation.  She is on chronic anticoagulation therapy.  History of hyperlipidemia and obesity.  History of knee surgery in 2004.  Surgery for epicondylitis of the elbow in 2002.  Bilateral tubal ligation 1986.  3 pregnancies and no miscarriages.  Social history: She does not smoke.  Social alcohol consumption consisting of wine.  3 adult children 2 sons and a daughter.  She is divorced and resides alone.  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  Family history: Daughter with history of Crohn's disease.  Mother died of complications of COPD.  Father deceased with history of dementia hypertension and stroke.  Sister with history of diabetes hypertension and subsequently had bariatric surgery.  Another sister in good health.  One brother with history of kidney stones gout and allergies.      Review of Systems history of migraine headaches, history of anxiety and insomnia Needs pneumococcal and Prevnar vaccines-discussed with her      Objective:   Physical Exam Vitals signs reviewed.  Constitutional:      General: She is not in acute distress.    Appearance: Normal appearance. She is not ill-appearing.  HENT:     Head: Normocephalic and atraumatic.     Right Ear: Tympanic membrane and external ear normal.     Left Ear: Tympanic membrane and external ear normal.     Nose: Nose normal.     Mouth/Throat:     Mouth: Mucous membranes are moist.     Pharynx: Oropharynx is clear. No oropharyngeal exudate.  Eyes:     General: No scleral icterus.       Right eye: No discharge.        Left eye: No discharge.     Extraocular Movements: Extraocular movements intact.     Conjunctiva/sclera: Conjunctivae normal.     Pupils: Pupils are equal, round, and reactive to light.  Neck:     Musculoskeletal: Neck supple. No neck rigidity.     Comments: No thyromegaly Cardiovascular:     Rate and Rhythm: Normal rate and regular rhythm.     Pulses: Normal pulses.     Heart sounds: No murmur.     Comments: Breast without masses Pulmonary:     Effort: Pulmonary effort is normal. No respiratory distress.     Breath sounds: Rales present. No wheezing or rhonchi.  Abdominal:     General: Bowel sounds are normal.     Palpations: Abdomen is soft. There is no mass.     Tenderness: There  is no abdominal tenderness. There is no rebound.  Genitourinary:    Comments: Bimanual normal Musculoskeletal:     Right lower leg: No edema.     Left lower leg: No edema.  Skin:    General: Skin is warm and dry.     Findings: No rash.  Neurological:     General: No focal deficit present.     Mental Status: She is alert and oriented to person, place, and time.     Cranial Nerves: No cranial nerve deficit.     Sensory: No sensory deficit.     Motor: No weakness.  Psychiatric:        Mood and Affect: Mood normal.        Behavior: Behavior normal.        Thought Content: Thought content normal.        Judgment: Judgment normal.            Assessment & Plan:  Cough with Hx pneumonia in November 2019 ?  Element of GE reflux causing chronic cough. Has rales today. CXR shows no pneumonia Will be treated  with Levaquin 500 mg daily for 10 days and Hycodan 1 teaspoon p.o. every 8 hours as needed cough.  Large hiatal hernia-is on PPI.  Dr. Hassell Done does not want to do surgery  HTN- BP stable on metoprolol and diltiazem  BMI 31.94- diet and exercise suggested  History of paroxysmal atrial fib-is on chronic anticoagulation.  Is in sinus rhythm today.  History of depression treated with Zoloft  History of migraine headaches  History of insomnia treated with Valium as needed  Hyperlipidemia treated with Crestor -lipid panel stable.  Has low HDL of 37       Subjective:   Patient presents for Medicare Annual/Subsequent preventive examination.  Review Past Medical/Family/Social: See above   Risk Factors  Current exercise habits: Light exercise- needs to get more Dietary issues discussed: Low-fat low carbohydrate  Cardiac risk factors: Family history of stroke in father, paroxysmal atrial fibrillation, hyperlipidemia  Depression Screen  (Note: if answer to either of the following is "Yes", a more complete depression screening is indicated)   Over the past two weeks, have you felt down, depressed or hopeless? Down due to illness and stress Over the past two weeks, have you felt little interest or pleasure in doing things? No Have you lost interest or pleasure in daily life? No Do you often feel hopeless? No Do you cry easily over simple problems? No   Activities of Daily Living  In your present state of health, do you have any difficulty performing the following activities?:   Driving? No  Managing money? No  Feeding yourself? No  Getting from bed to chair? No  Climbing a flight of stairs? yes Preparing food and eating?: No  Bathing or showering? No  Getting dressed: No  Getting to the toilet? No  Using the  toilet:No  Moving around from place to place: No  In the past year have you fallen or had a near fall?:No  Are you sexually active? No  Do you have more than one partner? No   Hearing Difficulties: No  Do you often ask people to speak up or repeat themselves? No  Do you experience ringing or noises in your ears? No  Do you have difficulty understanding soft or whispered voices? yes Do you feel that you have a problem with memory? No Do you often misplace items? No    Home Safety:  Do you have a smoke alarm at your residence? Yes Do you have grab bars in the bathroom?  Yes Do you have throw rugs in your house?  None   Cognitive Testing  Alert? Yes Normal Appearance?Yes  Oriented to person? Yes Place? Yes  Time? Yes  Recall of three objects? Yes  Can perform simple calculations? Yes  Displays appropriate judgment?Yes  Can read the correct time from a watch face?Yes   List the Names of Other Physician/Practitioners you currently use:  See referral list for the physicians patient is currently seeing.  Cardiology Dr. Carlean Purl   Review of Systems:see above   Objective:     General appearance: Appears stated age and mildly obese  Head: Normocephalic, without obvious abnormality, atraumatic  Eyes: conj clear, EOMi PEERLA  Ears: normal TM's and external ear canals both ears  Nose: Nares normal. Septum midline. Mucosa normal. No drainage or sinus tenderness.  Throat: lips, mucosa, and tongue normal; teeth and gums normal  Neck: no adenopathy, no carotid bruit, no JVD, supple, symmetrical, trachea midline and thyroid not enlarged, symmetric, no tenderness/mass/nodules  No CVA tenderness.  Lungs: clear to auscultation bilaterally  Breasts: normal appearance, no masses or tenderness Heart: regular rate and rhythm, S1, S2 normal, no murmur, click, rub or gallop  Abdomen: soft, non-tender; bowel sounds normal; no masses, no organomegaly  Musculoskeletal: ROM normal in all joints,  no crepitus, no deformity, Normal muscle strengthen. Back  is symmetric, no curvature. Skin: Skin color, texture, turgor normal. No rashes or lesions  Lymph nodes: Cervical, supraclavicular, and axillary nodes normal.  Neurologic: CN 2 -12 Normal, Normal symmetric reflexes. Normal coordination and gait  Psych: Alert & Oriented x 3, Mood appear stable.    Assessment:    Annual wellness medicare exam   Plan:    During the course of the visit the patient was educated and counseled about appropriate screening and preventive services including:   Needs pneumococcal vaccine     Patient Instructions (the written plan) was given to the patient.  Medicare Attestation  I have personally reviewed:  The patient's medical and social history  Their use of alcohol, tobacco or illicit drugs  Their current medications and supplements  The patient's functional ability including ADLs,fall risks, home safety risks, cognitive, and hearing and visual impairment  Diet and physical activities  Evidence for depression or mood disorders  The patient's weight, height, BMI, and visual acuity have been recorded in the chart. I have made referrals, counseling, and provided education to the patient based on review of the above and I have provided the patient with a written personalized care plan for preventive services.

## 2018-11-11 NOTE — Progress Notes (Signed)
HPI: FU atrial fibrillation. Monitor in June 2015 showed sinus rhythm.  Last echocardiogram April 2018 showed normal LV function, mild left ventricular hypertrophy, grade 1 diastolic dysfunction.  Nuclear study March 2019 showed ejection fraction 63% and probable shifting breast attenuation.  Since I last saw her, she denies exertional chest pain.  She is recovering from recent pneumonia and she did have pleuritic chest pain at that time.  She has some residual dyspnea.  No syncope.  Current Outpatient Medications  Medication Sig Dispense Refill  . acetaminophen (TYLENOL) 500 MG tablet Take 500 mg by mouth every 6 (six) hours as needed.    . diazepam (VALIUM) 10 MG tablet Take 1 tablet (10 mg total) by mouth at bedtime as needed. for sleep 30 tablet 2  . diltiazem (CARDIZEM CD) 240 MG 24 hr capsule Take 1 capsule (240 mg total) by mouth daily. 30 capsule 5  . ELIQUIS 5 MG TABS tablet TAKE 1 TABLET(5 MG) BY MOUTH TWICE DAILY. 180 tablet 1  . HYDROcodone-homatropine (HYCODAN) 5-1.5 MG/5ML syrup Take 5 mLs by mouth every 8 (eight) hours as needed for cough. 120 mL 0  . levofloxacin (LEVAQUIN) 500 MG tablet Take 1 tablet (500 mg total) by mouth daily. 10 tablet 0  . metoprolol succinate (TOPROL-XL) 25 MG 24 hr tablet Take 1 tablet (25 mg total) by mouth daily. Take with or immediately following a meal. 90 tablet 3  . pantoprazole (PROTONIX) 40 MG tablet TAKE 1 TABLET(40 MG) BY MOUTH DAILY BEFORE BREAKFAST 90 tablet 0  . Polyethyl Glycol-Propyl Glycol (SYSTANE OP) Apply 1 drop to eye daily as needed (for dry eyes).     . rosuvastatin (CRESTOR) 20 MG tablet Take 1 tablet (20 mg total) by mouth daily. 30 tablet 5  . sertraline (ZOLOFT) 50 MG tablet TAKE 1 TABLET(50 MG) BY MOUTH DAILY 90 tablet 3   No current facility-administered medications for this visit.      Past Medical History:  Diagnosis Date  . Chronic headaches   . Depression   . Diastolic dysfunction    a. 12/2013 Echo: EF  60-65%, Gr1 DD, Ao sclerosis w/o stenosis, triv MR, mod dil LA, mild TR.  . Diverticulosis   . GERD (gastroesophageal reflux disease)   . History of hiatal hernia    with repair  . History of stress test    a. 10/2008 MV: Ef 75%, nl perfusion.  Marland Kitchen HTN (hypertension)   . Hyperlipidemia   . Obesity   . PAF (paroxysmal atrial fibrillation) ([place])    a. Anticoagulated w/ Eliquis (CHA2DS2VASc = 3).    Past Surgical History:  Procedure Laterality Date  . CATARACT EXTRACTION, BILATERAL    . COLONOSCOPY    . ELBOW SURGERY  left   Tendon Surgery  . ESOPHAGEAL MANOMETRY N/A 02/23/2018   Procedure: ESOPHAGEAL MANOMETRY (EM);  Surgeon: Mauri Pole, MD;  Location: [facility];  Service: Endoscopy;  Laterality: N/A;  . EYE SURGERY Left    L-yey myopia  . HERNIA REPAIR  1610   nissan fundiplication  . KNEE ARTHROSCOPY  2005   R-knee  . LAPAROSCOPIC NISSEN FUNDOPLICATION    . ROTATOR CUFF REPAIR Right   . TONSILLECTOMY    . TUBAL LIGATION    . UPPER GASTROINTESTINAL ENDOSCOPY      Social History   Socioeconomic History  . Marital status: Legally Separated    Spouse name: Not on file  . Number of children: Not on file  .  Years of education: Not on file  . Highest education level: Not on file  Occupational History  . Occupation: [employer]  . Financial resource strain: Not on file  . Food insecurity:    Worry: Not on file    Inability: Not on file  . Transportation needs:    Medical: Not on file    Non-medical: Not on file  Tobacco Use  . Smoking status: Former Smoker    Types: Cigarettes    Last attempt to quit: 09/06/1977    Years since quitting: 41.2  . Smokeless tobacco: Never Used  Substance and Sexual Activity  . Alcohol use: Yes    Comment: once a month  . Drug use: No  . Sexual activity: Not on file  Lifestyle  . Physical activity:    Days per week: Not on file    Minutes per session: Not on file  . Stress: Not  on file  Relationships  . Social connections:    Talks on phone: Not on file    Gets together: Not on file    Attends religious service: Not on file    Active member of club or organization: Not on file    Attends meetings of clubs or organizations: Not on file    Relationship status: Not on file  . Intimate partner violence:    Fear of current or ex partner: Not on file    Emotionally abused: Not on file    Physically abused: Not on file    Forced sexual activity: Not on file  Other Topics Concern  . Not on file  Social History Narrative  . Not on file    Family History  Problem Relation Age of Onset  . COPD Mother   . Asthma Mother   . Arthritis Father   . Hypertension Father   . Stroke Father   . Cancer Father   . Prostate cancer Father   . Hypertension Sister   . Diabetes Sister   . Hypertension Brother   . Cancer Sister        UTERINE  . Cancer Son        TESTICULAR  . Crohn's disease Daughter   . Colon cancer Neg Hx   . Esophageal cancer Neg Hx   . Stomach cancer Neg Hx   . Pancreatic cancer Neg Hx   . Liver disease Neg Hx     ROS: no fevers or chills, productive cough, hemoptysis, dysphasia, odynophagia, melena, hematochezia, dysuria, hematuria, rash, seizure activity, orthopnea, PND, pedal edema, claudication. Remaining systems are negative.  Physical Exam: Well-developed well-nourished in no acute distress.  Skin is warm and dry.  HEENT is normal.  Neck is supple.  Chest is clear to auscultation with normal expansion.  Cardiovascular exam is regular rate and rhythm.  Abdominal exam nontender or distended. No masses palpated. Extremities show no edema. neuro grossly intact  ECG-sinus rhythm at a rate of 82, nonspecific ST changes.  Personally reviewed  A/P  1 PAF-pt in sinus today; continue apixaban; check hgb and renal function; continue cardizem and low dose metoprolol for rate control if atrial fibrillation recurs.   2 hypertension-patient's  blood pressure is controlled today.  Continue present medications and follow.  3 hyperlipidemia-continue statin.  Kirk Ruths, MD

## 2018-11-17 ENCOUNTER — Ambulatory Visit: Payer: Medicare Other | Admitting: Internal Medicine

## 2018-11-19 ENCOUNTER — Encounter: Payer: Self-pay | Admitting: Internal Medicine

## 2018-11-22 ENCOUNTER — Ambulatory Visit: Payer: Medicare Other | Admitting: Internal Medicine

## 2018-11-25 ENCOUNTER — Ambulatory Visit (INDEPENDENT_AMBULATORY_CARE_PROVIDER_SITE_OTHER): Payer: Medicare Other | Admitting: Cardiology

## 2018-11-25 ENCOUNTER — Encounter: Payer: Self-pay | Admitting: Cardiology

## 2018-11-25 VITALS — BP 126/78 | HR 82 | Ht 64.5 in | Wt 187.0 lb

## 2018-11-25 DIAGNOSIS — I10 Essential (primary) hypertension: Secondary | ICD-10-CM

## 2018-11-25 DIAGNOSIS — E7800 Pure hypercholesterolemia, unspecified: Secondary | ICD-10-CM | POA: Diagnosis not present

## 2018-11-25 DIAGNOSIS — I480 Paroxysmal atrial fibrillation: Secondary | ICD-10-CM | POA: Diagnosis not present

## 2018-11-25 NOTE — Patient Instructions (Signed)

## 2019-01-08 ENCOUNTER — Other Ambulatory Visit: Payer: Self-pay | Admitting: Cardiology

## 2019-01-08 DIAGNOSIS — I480 Paroxysmal atrial fibrillation: Secondary | ICD-10-CM

## 2019-01-12 ENCOUNTER — Other Ambulatory Visit: Payer: Self-pay | Admitting: Internal Medicine

## 2019-01-21 ENCOUNTER — Encounter: Payer: Self-pay | Admitting: Cardiology

## 2019-01-21 DIAGNOSIS — G4700 Insomnia, unspecified: Secondary | ICD-10-CM

## 2019-01-21 DIAGNOSIS — Z760 Encounter for issue of repeat prescription: Secondary | ICD-10-CM

## 2019-01-21 MED ORDER — DIAZEPAM 10 MG PO TABS: 10.0000 mg | ORAL_TABLET | Freq: Every day | ORAL | 0 refills | Status: DC

## 2019-01-21 NOTE — Telephone Encounter (Signed)
Received weekend request to refill Valium from pharmacy. E-scribed Valium 10 ng #90 one po qhs with no refills.  Time spent with documentation and e-scribe 0 minutes. [email or web address]

## 2019-02-07 DIAGNOSIS — H524 Presbyopia: Secondary | ICD-10-CM | POA: Diagnosis not present

## 2019-02-07 DIAGNOSIS — Z961 Presence of intraocular lens: Secondary | ICD-10-CM | POA: Diagnosis not present

## 2019-02-07 DIAGNOSIS — H5010 Unspecified exotropia: Secondary | ICD-10-CM | POA: Diagnosis not present

## 2019-02-09 ENCOUNTER — Encounter: Payer: Self-pay | Admitting: Internal Medicine

## 2019-02-09 ENCOUNTER — Other Ambulatory Visit: Payer: Self-pay

## 2019-02-09 ENCOUNTER — Ambulatory Visit: Payer: Medicare Other | Admitting: Internal Medicine

## 2019-02-09 ENCOUNTER — Ambulatory Visit (INDEPENDENT_AMBULATORY_CARE_PROVIDER_SITE_OTHER): Payer: Medicare Other | Admitting: Internal Medicine

## 2019-02-09 VITALS — Ht 65.0 in | Wt 184.0 lb

## 2019-02-09 DIAGNOSIS — G8929 Other chronic pain: Secondary | ICD-10-CM

## 2019-02-09 DIAGNOSIS — H532 Diplopia: Secondary | ICD-10-CM

## 2019-02-09 DIAGNOSIS — R51 Headache: Secondary | ICD-10-CM

## 2019-02-09 DIAGNOSIS — K222 Esophageal obstruction: Secondary | ICD-10-CM | POA: Insufficient documentation

## 2019-02-09 DIAGNOSIS — R519 Headache, unspecified: Secondary | ICD-10-CM

## 2019-02-09 DIAGNOSIS — R1115 Cyclical vomiting syndrome unrelated to migraine: Secondary | ICD-10-CM

## 2019-02-09 DIAGNOSIS — R197 Diarrhea, unspecified: Secondary | ICD-10-CM | POA: Diagnosis not present

## 2019-02-09 DIAGNOSIS — Z86010 Personal history of colonic polyps: Secondary | ICD-10-CM | POA: Diagnosis not present

## 2019-02-09 MED ORDER — DICYCLOMINE HCL 20 MG PO TABS: 20.0000 mg | ORAL_TABLET | Freq: Three times a day (TID) | ORAL | 0 refills | Status: DC

## 2019-02-09 NOTE — Progress Notes (Signed)
TELEHEALTH ENCOUNTER IN SETTING OF COVID-19 PANDEMIC - REQUESTED BY PATIENT SERVICE PROVIDED BY TELEMEDECINE - TYPE: Telephone PATIENT LOCATION: Home  PATIENT HAS CONSENTED TO TELEHEALTH VISIT PROVIDER LOCATION: OFFICE REFERRING PROVIDER self and Dr. Renold Genta PARTICIPANTS OTHER THAN PATIENT: None TIME SPENT ON CALL: 35 minutes    Olivia Reynolds 68 y.o. July 21, 1951 [id number]  Assessment & Plan:   Encounter Diagnoses  Name Primary?   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  I think she has IBS causing the alternating bowel habits with more diarrhea than constipation.  However this vomiting issue though I have a cause based upon the esophagogastric junction outflow obstruction from the hiatal hernia, with headaches and double vision makes me wonder could she had developed some sort of CNS lesion on top of that.  While this goes against a unifying diagnosis, I have decided the perform a CT of the head.  I want to make sure she does not have a brain tumor.  Seems unlikely but unfortunately is possible.  Once I see that result, it will lead me towards repeating an upper GI series versus not.  I understand the reluctance to consider redo surgery because of her recurrent hiatal hernia after hiatal hernia repair, but I think that may need to be considered based upon the history I am getting here.  She has tried to alter her diet quite a bit and still has this regurgitation vomiting whether she eats or not.  One thing to consider might be whether or not she could have reduction of her hiatal hernia along with a TIF procedure at the same time.  She also needs to schedule a colonoscopy at some point as well.  By the old guidelines she is overdue by the new  guidelines she is in range to have one because of her history of colon polyps.  I have prescribed some dicyclomine to see if it helps her postprandial diarrhea.   I appreciate the opportunity to care for this patient. CC: Elby Showers, MD  Subjective:   Chief Complaint: Vomiting diarrhea  HPI [name] has a telehealth visit today, she is having problems with diarrhea and still is having problems with dysphagia and regurgitation/vomiting.  She had pneumonia last fall and was treated with Levaquin and diarrhea developed.  She has a history of IBS diarrhea predominant and she has been swinging between constipation and more diarrhea than anything.  She will take a half an Imodium which will help it does not lock her up or leave her without defecation for days on and but it helps control things a bit.  She has tried modifying her diet quite a bit so that she can swallow foods, she has had dysphagia, she has a recurrence of a hiatal hernia and esophageal manometry last year shows she has esophagogastric junction outflow obstruction thought most likely due to her hiatal hernia.  She had seen Dr. Hassell Done and he was understandably reluctant to go ahead with a redo surgery given how difficult that can be.  Unfortunately she has struggled with the regurgitation and she will regurgitate saliva and phlegm, it can be particularly bad at night.  She is even being sitting up at night which helps a bit but then sleep  is difficult.  She feels isolated by all of this even before the COVID-19.  It is difficult for her to leave the house and her family does not want to be around her because of her difficulties with eating.  The diarrhea is a problem as well.  She does not seem to have lost significant amounts of weight with this but her appetite is way off.  On top of all this she has an increase in chronic headaches, and she developed double vision.  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  So she is having difficulty reading.  She has a pending referral to a pediatric ophthalmologist for consideration of repair it sounds like.  I do not think she is having nystagmus.  She relates increase in her vision symptoms with increase in the vomiting and regurgitation.  She has some chest discomfort as well.  She recently saw Dr. Stanford Breed of cardiology she takes Eliquis for atrial fibrillation.  She had a good checkup there.  Last colonoscopy in 2013 with 2 small sessile serrated polyps.  EGD in 2018 demonstrated a hiatal hernia and she did not respond to empiric GE junction dilation.  Upper GI series last year i.e. barium swallow showed a hiatal hernia and some dysmotility associated.  She had normal peristalsis on the manometry in May 2019. Allergies  Allergen Reactions   Erythromycin Hives   Statins     Lipitor   Codeine Hives   Hctz [Hydrochlorothiazide] Rash   Lipitor [Atorvastatin] Other (See Comments)    Joint pain   Current Meds  Medication Sig   acetaminophen (TYLENOL) 500 MG tablet Take 500 mg by mouth every 6 (six) hours as needed.   diazepam (VALIUM) 10 MG tablet Take 1 tablet (10 mg total) by mouth at bedtime. for sleep   diltiazem (CARDIZEM CD) 240 MG 24 hr capsule TAKE 1 CAPSULE BY MOUTH DAILY   ELIQUIS 5 MG TABS tablet TAKE 1 TABLET(5 MG) BY MOUTH TWICE DAILY.   loperamide (IMODIUM) 2 MG capsule Take 2 mg by mouth as needed for diarrhea or loose stools.   metoprolol succinate (TOPROL-XL) 25 MG 24 hr tablet Take 1 tablet (25 mg total) by mouth daily. Take with or immediately following a meal.   OVER THE COUNTER MEDICATION Tylenol Migraine , as needed   OVER THE COUNTER MEDICATION Percogesic backache relief medicine- over the counter  Use as needed   pantoprazole (PROTONIX) 40 MG tablet TAKE 1 TABLET(40 MG) BY MOUTH DAILY BEFORE BREAKFAST   rosuvastatin (CRESTOR) 20 MG tablet Take 1 tablet (20 mg total) by  mouth daily.   sertraline (ZOLOFT) 50 MG tablet TAKE 1 TABLET(50 MG) BY MOUTH DAILY   Past Medical History:  Diagnosis Date   Chronic headaches    Depression    Diastolic dysfunction    a. 12/2013 Echo: EF 60-65%, Gr1 DD, Ao sclerosis w/o stenosis, triv MR, mod dil LA, mild TR.   Diverticulosis    GERD (gastroesophageal reflux disease)    History of hiatal hernia    with repair   History of stress test    a. 10/2008 MV: Ef 75%, nl perfusion.   HTN (hypertension)    Hyperlipidemia    Migraine    Obesity    PAF (paroxysmal atrial fibrillation) ([place])    a. Anticoagulated w/ Eliquis (CHA2DS2VASc = 3).   Past Surgical History:  Procedure Laterality Date  CATARACT EXTRACTION, BILATERAL     COLONOSCOPY     ELBOW SURGERY  left   Tendon Surgery   ESOPHAGEAL MANOMETRY N/A 02/23/2018   Procedure: ESOPHAGEAL MANOMETRY (EM);  Surgeon: Mauri Pole, MD;  Location: [facility];  Service: Endoscopy;  Laterality: N/A;   EYE SURGERY Left    L-yey myopia   HERNIA REPAIR  6226   nissan fundiplication   KNEE ARTHROSCOPY  2005   R-knee   LAPAROSCOPIC NISSEN FUNDOPLICATION     ROTATOR CUFF REPAIR Right    TONSILLECTOMY     TUBAL LIGATION     UPPER GASTROINTESTINAL ENDOSCOPY     Social History   Social History Narrative   Divorced, 3 children lives alone   She is a retired Development worker, 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   Review of Systems   Wt Readings from Last 3 Encounters:  02/09/19 184 lb (83.5 kg)  11/25/18 187 lb (84.8 kg)  10/06/18 189 lb (85.7 kg)   As per HPI

## 2019-02-09 NOTE — Patient Instructions (Addendum)
Sorry to hear how you have been feeling.  I will order a CT scan of the brain with contrast because of the vomiting, headaches and double vision. [facility] is going to reach out to you and set this appointment up. Their # is [phone].   You will need to have a BUN and creatinine checked. This can be done at our lab 8:00AM-4:00PM Monday-Friday, no appointment needed.  I sent a prescription for dicyclomine into pharmacy that can help diarrhea. That was not on your formulary but is cheap and they should charge cash price.  I appreciate the opportunity to care for you. Gatha Mayer, MD, [facility]

## 2019-02-10 ENCOUNTER — Other Ambulatory Visit (INDEPENDENT_AMBULATORY_CARE_PROVIDER_SITE_OTHER): Payer: Medicare Other

## 2019-02-10 DIAGNOSIS — R1115 Cyclical vomiting syndrome unrelated to migraine: Secondary | ICD-10-CM | POA: Diagnosis not present

## 2019-02-10 DIAGNOSIS — R51 Headache: Secondary | ICD-10-CM

## 2019-02-10 DIAGNOSIS — R519 Headache, unspecified: Secondary | ICD-10-CM

## 2019-02-10 LAB — BUN: BUN: 15 mg/dL (ref 6–23)

## 2019-02-10 LAB — CREATININE, SERUM: Creatinine, Ser: 0.77 mg/dL (ref 0.40–1.20)

## 2019-02-13 ENCOUNTER — Inpatient Hospital Stay: Admission: RE | Admit: 2019-02-13 | Payer: Medicare Other | Source: Ambulatory Visit

## 2019-02-17 ENCOUNTER — Ambulatory Visit
Admission: RE | Admit: 2019-02-17 | Discharge: 2019-02-17 | Disposition: A | Discharge status: Home / Self Care | Payer: Medicare Other | Source: Ambulatory Visit | Attending: Internal Medicine | Admitting: Internal Medicine

## 2019-02-17 DIAGNOSIS — R1115 Cyclical vomiting syndrome unrelated to migraine: Secondary | ICD-10-CM

## 2019-02-17 DIAGNOSIS — H532 Diplopia: Secondary | ICD-10-CM

## 2019-02-17 DIAGNOSIS — G8929 Other chronic pain: Secondary | ICD-10-CM

## 2019-02-17 DIAGNOSIS — R51 Headache: Secondary | ICD-10-CM | POA: Diagnosis not present

## 2019-02-17 DIAGNOSIS — R519 Headache, unspecified: Secondary | ICD-10-CM

## 2019-02-17 MED ORDER — IOPAMIDOL (ISOVUE-300) INJECTION 61%
75.0000 mL | Freq: Once | INTRAVENOUS | Status: AC | PRN
  Administered 2019-02-17: 75 mL via INTRAVENOUS

## 2019-02-21 ENCOUNTER — Other Ambulatory Visit: Payer: Self-pay

## 2019-02-21 DIAGNOSIS — R1115 Cyclical vomiting syndrome unrelated to migraine: Secondary | ICD-10-CM

## 2019-02-21 NOTE — Progress Notes (Signed)
Let her know this is ok please I would like her to do an upper GI series to f/u hiatal hernia and because of persistent vomiting

## 2019-02-22 ENCOUNTER — Telehealth: Payer: Self-pay | Admitting: Internal Medicine

## 2019-02-22 NOTE — Telephone Encounter (Signed)
Pt call back to resched. appt for 05/29/ @10 :30am she stated that she was unable to keep that date

## 2019-02-22 NOTE — Telephone Encounter (Signed)
Patient advised to call [phone] and they will assist her in rescheduling.

## 2019-02-24 ENCOUNTER — Ambulatory Visit (HOSPITAL_COMMUNITY): Payer: Medicare Other

## 2019-03-14 ENCOUNTER — Other Ambulatory Visit: Payer: Self-pay | Admitting: Cardiology

## 2019-03-15 ENCOUNTER — Ambulatory Visit (HOSPITAL_COMMUNITY)
Admission: RE | Admit: 2019-03-15 | Discharge: 2019-03-15 | Disposition: A | Discharge status: Home / Self Care | Payer: Medicare Other | Source: Ambulatory Visit | Attending: Internal Medicine | Admitting: Internal Medicine

## 2019-03-15 ENCOUNTER — Other Ambulatory Visit: Payer: Self-pay

## 2019-03-15 ENCOUNTER — Other Ambulatory Visit: Payer: Self-pay | Admitting: Internal Medicine

## 2019-03-15 DIAGNOSIS — K449 Diaphragmatic hernia without obstruction or gangrene: Secondary | ICD-10-CM | POA: Diagnosis not present

## 2019-03-15 DIAGNOSIS — K219 Gastro-esophageal reflux disease without esophagitis: Secondary | ICD-10-CM | POA: Diagnosis not present

## 2019-03-15 DIAGNOSIS — R1115 Cyclical vomiting syndrome unrelated to migraine: Secondary | ICD-10-CM

## 2019-03-16 ENCOUNTER — Telehealth: Payer: Self-pay

## 2019-03-16 NOTE — Progress Notes (Signed)
Please let her know this is unchanged from before - still has hiatal hernia same size  I am communicating with Dr. Hassell Done and will get back  Ask her if dicyclomine was helpful for diarrhea?

## 2019-03-16 NOTE — Telephone Encounter (Signed)
Notes recorded by Gatha Mayer, MD on 03/16/2019 at 12:22 PM EDT  I have communicated with Dr. Hassell Done and relayed this to her by phone. She is much better on pantoprazole regarding reflux so we will continue that.  She needs a colonoscopy because of a history of colon polyps.   Plan   Obtain clearance regarding holding Eliquis 2 days before colonoscopy, Dr. Stanford Breed is her cardiologist   Schedule colonoscopy and pre-visit after that

## 2019-03-16 NOTE — Telephone Encounter (Signed)
[facility] Pre-operative Risk Assessment     Request for surgical clearance:     Endoscopy Procedure  What type of surgery is being performed?     Colonoscopy  When is this surgery scheduled?     TBD  What type of clearance is required ?   Pharmacy  Are there any medications that need to be held prior to surgery and how long? Eliquis  Practice name and name of physician performing surgery?      [facility]  What is your office phone and fax number?      Phone- [phone]  [phone]  Anesthesia type (None, local, MAC, general) ?       MAC

## 2019-03-17 NOTE — Telephone Encounter (Signed)
Patient with diagnosis of afib on Eliquis for anticoagulation.    Procedure:  Colonoscopy Date of procedure: TBD  CHADS2-VASc score of  4 (CHF, HTN, AGE, DM2, stroke/tia x 2, CAD, AGE, female)  CrCl 38ml/min  Per office protocol, patient can hold Eliquis for 1-2 days prior to procedure.

## 2019-03-23 NOTE — Telephone Encounter (Signed)
No answer Left message on machine to call back

## 2019-03-24 NOTE — Telephone Encounter (Signed)
The pt states that she is feeling 100% better and the medications prescribed have worked wonders.  She is not sure abut having colon at this time.  She will call her insurance company and think about whether or not she will proceed.

## 2019-04-09 ENCOUNTER — Other Ambulatory Visit: Payer: Self-pay | Admitting: Internal Medicine

## 2019-04-16 ENCOUNTER — Other Ambulatory Visit: Payer: Self-pay | Admitting: Cardiology

## 2019-04-20 ENCOUNTER — Encounter: Payer: Self-pay | Admitting: Internal Medicine

## 2019-05-04 ENCOUNTER — Other Ambulatory Visit: Payer: Self-pay | Admitting: Internal Medicine

## 2019-05-04 MED ORDER — DIAZEPAM 10 MG PO TABS: 10.0000 mg | ORAL_TABLET | Freq: Every day | ORAL | 0 refills | Status: DC

## 2019-05-04 NOTE — Telephone Encounter (Signed)
Received Fax RX request from  [facility]  Medication - diazepam (VALIUM) 10 MG tablet    Last Refill - 01/21/19  Last OV - 10/06/18  Last CPE - 10/06/18

## 2019-05-05 ENCOUNTER — Telehealth: Payer: Self-pay | Admitting: Internal Medicine

## 2019-05-05 ENCOUNTER — Other Ambulatory Visit: Payer: Self-pay

## 2019-05-05 ENCOUNTER — Ambulatory Visit: Payer: Medicare Other | Admitting: *Deleted

## 2019-05-05 VITALS — Ht 65.0 in | Wt 190.0 lb

## 2019-05-05 DIAGNOSIS — Z86010 Personal history of colonic polyps: Secondary | ICD-10-CM

## 2019-05-05 NOTE — Progress Notes (Signed)
Telephone interview for Previsit, ID per name, DOB and address.  No egg or soy allergy known to patient  No issues with past sedation with any surgeries  or procedures, no intubation problems  No diet pills per patient No home 02 use per patient  No blood thinners per patient  Pt denies issues with constipation  No A fib or A flutter  EMMI  Information, consent, acknowledgement form with stamped envelope for return, and instruction. Patient aware. At the endn of interview patient stating increased difficulty with constipation recently, taking ducolax without relief, eating brocoli with relief. Patient will eat cooked brocoli the week prior to procedure, increase water intake and call on Monday prior to procedure if she is experiencing increase in constipation. Patient agreed with plan.

## 2019-05-05 NOTE — Telephone Encounter (Signed)
Called and spoke with patient to schedule CPE due 10/06/18, she does not want to schedule at this time. She is having colonoscopy in a couple of weeks, she would like for me to call back after that to schedule.

## 2019-05-16 ENCOUNTER — Telehealth: Payer: Self-pay

## 2019-05-16 NOTE — Telephone Encounter (Signed)
Covid-19 screening questions   Do you now or have you had a fever in the last 14 days? No  Do you have any respiratory symptoms of shortness of breath or cough now or in the last 14 days? No  Do you have any family members or close contacts with diagnosed or suspected Covid-19 in the past 14 days? Yes Grandson had Covid over at college.   Have you been tested for Covid-19 and found to be positive? No      Confirmed with patient

## 2019-05-17 ENCOUNTER — Ambulatory Visit (AMBULATORY_SURGERY_CENTER): Payer: Medicare Other | Admitting: Internal Medicine

## 2019-05-17 ENCOUNTER — Other Ambulatory Visit: Payer: Self-pay

## 2019-05-17 ENCOUNTER — Encounter: Payer: Self-pay | Admitting: Internal Medicine

## 2019-05-17 VITALS — BP 120/48 | HR 66 | Temp 97.7°F | Resp 16 | Ht 65.0 in | Wt 190.0 lb

## 2019-05-17 DIAGNOSIS — D120 Benign neoplasm of cecum: Secondary | ICD-10-CM | POA: Diagnosis not present

## 2019-05-17 DIAGNOSIS — Z86010 Personal history of colonic polyps: Secondary | ICD-10-CM

## 2019-05-17 DIAGNOSIS — D122 Benign neoplasm of ascending colon: Secondary | ICD-10-CM | POA: Diagnosis not present

## 2019-05-17 MED ORDER — SODIUM CHLORIDE 0.9 % IV SOLN: 500.0000 mL | Freq: Once | INTRAVENOUS | Status: DC

## 2019-05-17 NOTE — Progress Notes (Signed)
JB- Temp CW- Vitals 

## 2019-05-17 NOTE — Progress Notes (Signed)
PT taken to PACU. Monitors in place. VSS. Report given to RN. 

## 2019-05-17 NOTE — Op Note (Signed)
[place] Patient Name: Olivia Reynolds Procedure Date: 05/17/2019 12:22 PM MRN: [id number] Endoscopist: Gatha Mayer , MD Age: 68 Referring MD:  Date of Birth: 05-Dec-1950 Gender: Female Account #: [id number] Procedure:                Colonoscopy Indications:              High risk colon cancer surveillance: Personal                            history of sessile serrated colon polyp (less than                            10 mm in size) with no dysplasia Medicines:                Propofol per Anesthesia, Monitored Anesthesia Care Procedure:                Pre-Anesthesia Assessment:                           - Prior to the procedure, a History and Physical                            was performed, and patient medications and                            allergies were reviewed. The patient's tolerance of                            previous anesthesia was also reviewed. The risks                            and benefits of the procedure and the sedation                            options and risks were discussed with the patient.                            All questions were answered, and informed consent                            was obtained. Prior Anticoagulants: The patient                            last took Eliquis (apixaban) 2 days prior to the                            procedure. ASA Grade Assessment: III - A patient                            with severe systemic disease. After reviewing the                            risks and benefits, the patient was deemed in  satisfactory condition to undergo the procedure.                           After obtaining informed consent, the colonoscope                            was passed under direct vision. Throughout the                            procedure, the patient's blood pressure, pulse, and                            oxygen saturations were monitored continuously. The   Colonoscope was introduced through the anus and                            advanced to the the cecum, identified by                            appendiceal orifice and ileocecal valve. The                            colonoscopy was performed without difficulty. The                            patient tolerated the procedure well. The quality                            of the bowel preparation was good. The ileocecal                            valve, appendiceal orifice, and rectum were                            photographed. The bowel preparation used was                            Miralax via split dose instruction. Scope In: 12:32:54 PM Scope Out: 12:47:26 PM Scope Withdrawal Time: 0 hours 11 minutes 23 seconds  Total Procedure Duration: 0 hours 14 minutes 32 seconds  Findings:                 The perianal and digital rectal examinations were                            normal.                           Three sessile polyps were found in the proximal                            transverse colon and cecum. The polyps were                            diminutive in size. These polyps were removed with  a cold snare. Resection and retrieval were                            complete. Verification of patient identification                            for the specimen was done. Estimated blood loss was                            minimal.                           Multiple diverticula were found in the sigmoid                            colon.                           The exam was otherwise without abnormality on                            direct and retroflexion views. Complications:            No immediate complications. Estimated Blood Loss:     Estimated blood loss was minimal. Impression:               - Three diminutive polyps in the proximal                            transverse colon and in the cecum, removed with a                            cold snare. Resected  and retrieved.                           - Diverticulosis in the sigmoid colon.                           - The examination was otherwise normal on direct                            and retroflexion views.                           - Personal history of colonic polyps. Recommendation:           - Patient has a contact number available for                            emergencies. The signs and symptoms of potential                            delayed complications were discussed with the                            patient. Return to normal activities tomorrow.  Written discharge instructions were provided to the                            patient.                           - Resume previous diet.                           - Continue present medications.                           - Repeat colonoscopy is recommended for                            surveillance. The colonoscopy date will be                            determined after pathology results from today's                            exam become available for review.                           - Resume Eliquis (apixaban) at prior dose tomorrow.                           - Will likely need f/u office visit to review                            ongoing IBS problems.                           Will review pathology and then contact about that                            if she has not scheduled. Gatha Mayer, MD 05/17/2019 12:58:01 PM This report has been signed electronically.

## 2019-05-17 NOTE — Progress Notes (Signed)
Pt's states no medical or surgical changes since previsit or office visit. 

## 2019-05-17 NOTE — Progress Notes (Signed)
Called to room to assist during endoscopic procedure.  Patient ID and intended procedure confirmed with present staff. Received instructions for my participation in the procedure from the performing physician.  

## 2019-05-17 NOTE — Patient Instructions (Addendum)
I found and removed 3 small polyps ad saw diverticulosis as before.  I will let you know pathology results and when to have another routine colonoscopy by mail and/or My Chart.  Sorry the bowel habit problems have returned.  Please make a follow-up visit so that we can figure out what else we can do to improve things.  Restart Eliquis tomorrow.  I appreciate the opportunity to care for you. Gatha Mayer, MD, [facility]   Need to make f/u appointment with Dr Carlean Purl to review ongoing IBS problems  INFORMATION ON POLYPS AND DIVERTICULOSIS GIVEN TO YOU TODAY   YOU HAD AN ENDOSCOPIC PROCEDURE TODAY AT [facility]:   Refer to the procedure report that was given to you for any specific questions about what was found during the examination.  If the procedure report does not answer your questions, please call your gastroenterologist to clarify.  If you requested that your care partner not be given the details of your procedure findings, then the procedure report has been included in a sealed envelope for you to review at your convenience later.  YOU SHOULD EXPECT: Some feelings of bloating in the abdomen. Passage of more gas than usual.  Walking can help get rid of the air that was put into your GI tract during the procedure and reduce the bloating. If you had a lower endoscopy (such as a colonoscopy or flexible sigmoidoscopy) you may notice spotting of blood in your stool or on the toilet paper. If you underwent a bowel prep for your procedure, you may not have a normal bowel movement for a few days.  Please Note:  You might notice some irritation and congestion in your nose or some drainage.  This is from the oxygen used during your procedure.  There is no need for concern and it should clear up in a day or so.  SYMPTOMS TO REPORT IMMEDIATELY:   Following lower endoscopy (colonoscopy or flexible sigmoidoscopy):  Excessive amounts of blood in the stool  Significant  tenderness or worsening of abdominal pains  Swelling of the abdomen that is new, acute  Fever of 100F or higher    For urgent or emergent issues, a gastroenterologist can be reached at any hour by calling [phone].   DIET:  We do recommend a small meal at first, but then you may proceed to your regular diet.  Drink plenty of fluids but you should avoid alcoholic beverages for 24 hours.  ACTIVITY:  You should plan to take it easy for the rest of today and you should NOT DRIVE or use heavy machinery until tomorrow (because of the sedation medicines used during the test).    FOLLOW UP: Our staff will call the number listed on your records 48-72 hours following your procedure to check on you and address any questions or concerns that you may have regarding the information given to you following your procedure. If we do not reach you, we will leave a message.  We will attempt to reach you two times.  During this call, we will ask if you have developed any symptoms of COVID 19. If you develop any symptoms (ie: fever, flu-like symptoms, shortness of breath, cough etc.) before then, please call [phone].  If you test positive for Covid 19 in the 2 weeks post procedure, please call and report this information to [place].    If any biopsies were taken you will be contacted by phone or by letter within the next  1-3 weeks.  Please call us at [phone] if you have not heard about the biopsies in 3 weeks.    SIGNATURES/CONFIDENTIALITY: You and/or your care partner have signed paperwork which will be entered into your electronic medical record.  These signatures attest to the fact that that the information above on your After Visit Summary has been reviewed and is understood.  Full responsibility of the confidentiality of this discharge information lies with you and/or your care-partner.

## 2019-05-19 ENCOUNTER — Telehealth: Payer: Self-pay

## 2019-05-19 NOTE — Telephone Encounter (Signed)
Follow up call to pt, left message, pt to call if problems, otherwise we will call back after noon today.

## 2019-05-19 NOTE — Telephone Encounter (Signed)
  Follow up Call-  Call back number 05/17/2019 08/11/2017  Post procedure Call Back phone  # [phone]  Permission to leave phone message Yes Yes  Some recent data might be hidden     Patient questions:  Do you have a fever, pain , or abdominal swelling? No. Pain Score  0 *  Have you tolerated food without any problems? Yes.    Have you been able to return to your normal activities? Yes.    Do you have any questions about your discharge instructions: Diet   No. Medications  No. Follow up visit  No.  Do you have questions or concerns about your Care? No.  Actions: * If pain score is 4 or above: No action needed, pain <4.  1. Have you developed a fever since your procedure? no  2.   Have you had an respiratory symptoms (SOB or cough) since your procedure? no  3.   Have you tested positive for COVID 19 since your procedure no  4.   Have you had any family members/close contacts diagnosed with the COVID 19 since your procedure?  no   If yes to any of these questions please route to Joylene John, RN and Alphonsa Gin, Therapist, sports.

## 2019-05-30 ENCOUNTER — Telehealth: Payer: Self-pay | Admitting: Internal Medicine

## 2019-05-30 DIAGNOSIS — R198 Other specified symptoms and signs involving the digestive system and abdomen: Secondary | ICD-10-CM | POA: Insufficient documentation

## 2019-05-30 DIAGNOSIS — R159 Full incontinence of feces: Secondary | ICD-10-CM | POA: Insufficient documentation

## 2019-05-30 MED ORDER — MIRTAZAPINE 15 MG PO TABS: 15.0000 mg | ORAL_TABLET | Freq: Every day | ORAL | 1 refills | Status: DC

## 2019-05-30 NOTE — Progress Notes (Signed)
3 diminutive adenomas  I called patient and made a phone note  Make a 5 year - 2025 colon recall  No letter

## 2019-05-30 NOTE — Telephone Encounter (Signed)
I called her about colonoscopy results  She had 3 dminutive adenomas and I will have [facility] recall for 2025   1) IBS - mixed 2) fecal urgency and incontinence 3) minimal urinary incontinence 4) insomnia,loss of wgt and depression - plus family wants her to go to a mood treatment center and is telling all of her problems are emotional/mental   Plans  1) Remeron 15 mg qhs - Rx sent by me  2) Benefiber 1 tablespoon daily - go up to 2 tbsop if necessarr  3) refer to pelvic PT [facility] or [facility] re: fecal incontinence problems and altered defecation  4) Set up an OV me early to mid October   5) Once on remeron for a week and if sleeping ok try to wean qhs diazepam by alternating nightly then every 3 nights and every 4th night over a few weeks to a month - please remind her of this - if she needs both that is ok - I will increase Remeron at f/u if necessary but will try to get off diazepam over time

## 2019-05-31 NOTE — Telephone Encounter (Signed)
OK. Thanks.

## 2019-05-31 NOTE — Telephone Encounter (Signed)
Patient notified of all recomendaitons She understands that she will be contacted directly for an appt with physical therapy Follow up arranged for 07/18/19

## 2019-06-02 ENCOUNTER — Telehealth: Payer: Self-pay | Admitting: Internal Medicine

## 2019-06-02 NOTE — Telephone Encounter (Signed)
   5) Once on remeron for a week and if sleeping ok try to wean qhs diazepam by alternating nightly then every 3 nights and every 4th night over a few weeks to a month - please remind her of this - if she needs both that is ok - I will increase Remeron at f/u if necessary but will try to get off diazepam over time   We discussed in detail the remeron and diazepam instructions.  The pt has been advised of the information and verbalized understanding.

## 2019-06-06 DIAGNOSIS — F3181 Bipolar II disorder: Secondary | ICD-10-CM | POA: Diagnosis not present

## 2019-06-14 ENCOUNTER — Ambulatory Visit: Payer: Medicare Other | Admitting: Physical Therapy

## 2019-06-22 ENCOUNTER — Ambulatory Visit: Payer: Medicare Other | Admitting: Physical Therapy

## 2019-07-03 ENCOUNTER — Ambulatory Visit: Payer: Medicare Other | Admitting: Physical Therapy

## 2019-07-05 ENCOUNTER — Telehealth: Payer: Self-pay | Admitting: Internal Medicine

## 2019-07-05 NOTE — Telephone Encounter (Signed)
Patient reports frequent urination, low back pain, and abdominal pain.  She is encouraged to start with her PCP.  She is having pain radiating down her back and leg.  She will see her PCP.  She will call back if she has GI concerns

## 2019-07-06 ENCOUNTER — Telehealth: Payer: Self-pay | Admitting: Internal Medicine

## 2019-07-06 NOTE — Telephone Encounter (Signed)
I would need to see her. Can she call family to bring her in or go to urgent care?

## 2019-07-06 NOTE — Telephone Encounter (Signed)
Called patient back to let her know she would have to be seen, she is going to try and get someone to bring here at 4:30 today, if not them tomorrow.

## 2019-07-06 NOTE — Telephone Encounter (Signed)
Olivia Reynolds [phone]  Brianna called to say she is expercing lower back pain, lower abdominal pain, last night had low grade fever 99.8 (Tylenol brought it down) no fever today, urine is dark and she is light headed. She has no one that can bring her in to office today.

## 2019-07-12 ENCOUNTER — Other Ambulatory Visit: Payer: Self-pay | Admitting: Internal Medicine

## 2019-07-17 ENCOUNTER — Telehealth: Payer: Self-pay | Admitting: Internal Medicine

## 2019-07-17 NOTE — Telephone Encounter (Signed)
Pt is calling and said that Dr. Carlean Purl has her on Remeron and she is also on Zoloft and she said they are both anti-depression and she is wondering if she needs to go off of one or not.

## 2019-07-17 NOTE — Telephone Encounter (Signed)
Patient informed and said thank you for your time.

## 2019-07-17 NOTE — Telephone Encounter (Signed)
OK with that

## 2019-07-17 NOTE — Telephone Encounter (Signed)
First off she is singing your praises for helping her. She is sleeping better on the mirtazapine. She has not taken any sertraline in 3 days because the pharmacist told her to check with you about taking both. She didn't notice any side effects when on both but like you said 1 is better than 2 medicines. She would like to stay off the sertraline and continue the mirtazapine. Please advise Sir?

## 2019-07-17 NOTE — Telephone Encounter (Signed)
The answer would be maybe.  Is she having problems - side effcts?  Is she improved with the Remeron?  I definitely believe in 1 medicine instead of 2 but we need to revisit things re: ? Above  I see her 12/1 and we can talk more then but if she has specific ? Let me know now

## 2019-07-17 NOTE — Telephone Encounter (Signed)
Please advise Sir, thank you. 

## 2019-07-18 ENCOUNTER — Ambulatory Visit: Payer: Medicare Other | Admitting: Internal Medicine

## 2019-07-18 DIAGNOSIS — Z23 Encounter for immunization: Secondary | ICD-10-CM | POA: Diagnosis not present

## 2019-07-18 NOTE — Telephone Encounter (Signed)
error 

## 2019-08-09 ENCOUNTER — Other Ambulatory Visit: Payer: Self-pay

## 2019-08-09 ENCOUNTER — Other Ambulatory Visit: Payer: Self-pay | Admitting: Internal Medicine

## 2019-08-09 MED ORDER — ROSUVASTATIN CALCIUM 20 MG PO TABS: ORAL_TABLET | ORAL | 3 refills | Status: DC

## 2019-08-09 NOTE — Telephone Encounter (Signed)
How many refills Sir, thank you. 

## 2019-08-16 ENCOUNTER — Other Ambulatory Visit: Payer: Self-pay | Admitting: Pharmacist

## 2019-08-16 MED ORDER — APIXABAN 5 MG PO TABS: 5.0000 mg | ORAL_TABLET | Freq: Two times a day (BID) | ORAL | 1 refills | Status: DC

## 2019-08-18 ENCOUNTER — Other Ambulatory Visit: Payer: Self-pay

## 2019-08-18 DIAGNOSIS — Z20822 Contact with and (suspected) exposure to covid-19: Secondary | ICD-10-CM

## 2019-08-21 LAB — NOVEL CORONAVIRUS, NAA: SARS-CoV-2, NAA: NOT DETECTED

## 2019-08-23 ENCOUNTER — Telehealth: Payer: Self-pay | Admitting: Internal Medicine

## 2019-08-23 NOTE — Telephone Encounter (Signed)
Pt calling to get COVID results, made her aware those are negative.

## 2019-08-29 ENCOUNTER — Telehealth: Payer: Self-pay | Admitting: Internal Medicine

## 2019-08-29 ENCOUNTER — Telehealth (INDEPENDENT_AMBULATORY_CARE_PROVIDER_SITE_OTHER): Payer: Medicare Other | Admitting: Internal Medicine

## 2019-08-29 ENCOUNTER — Other Ambulatory Visit: Payer: Self-pay

## 2019-08-29 DIAGNOSIS — R112 Nausea with vomiting, unspecified: Secondary | ICD-10-CM

## 2019-08-29 DIAGNOSIS — K582 Mixed irritable bowel syndrome: Secondary | ICD-10-CM | POA: Diagnosis not present

## 2019-08-29 DIAGNOSIS — F419 Anxiety disorder, unspecified: Secondary | ICD-10-CM | POA: Diagnosis not present

## 2019-08-29 DIAGNOSIS — F329 Major depressive disorder, single episode, unspecified: Secondary | ICD-10-CM

## 2019-08-29 DIAGNOSIS — F32A Depression, unspecified: Secondary | ICD-10-CM

## 2019-08-29 NOTE — Telephone Encounter (Signed)
Patient has been converted to a MyChj[employer] Video visit.

## 2019-08-29 NOTE — Progress Notes (Signed)
TELEHEALTH ENCOUNTER IN SETTING OF COVID-19 PANDEMIC - REQUESTED BY PATIENT SERVICE PROVIDED BY TELEMEDECINE - TYPE: Epic My Chart AV PATIENT LOCATION: home PATIENT HAS CONSENTED TO TELEHEALTH VISIT PROVIDER LOCATION: OFFICE REFERRING PROVIDER Elby Showers, MD  PARTICIPANTS OTHER THAN PATIENT:none TIME SPENT ON CALL:16 mins    Olivia Reynolds 68 y.o. [birth year] [id number]  Assessment & Plan:  Irritable bowel syndrome with both constipation and diarrhea Now w/ constipation after diarrhea and incontinence Dc dicyclomine and reassess, ? Needs fiber/MiraLAx RTC Jan/Feb  Intractable vomiting Much beter on current regimen - suspect mirtazapine reason why If weight gain too much consider 7.7 mg mirtazapine  Depression Mirtazapine helping  Anxiety improved      Subjective:   Chief Complaint: f/u nausae and vomiting, weight loss, diarrhea  HPI Audrionna reports she is better overall - can leave house w/o worrying about GI sxs, seeing family. Not vomiting and not having urgent diarrhea. In fact now constipated. Sleeping well and does not need diazepam qhs so stopped. Was losing but now gaining weight. Wt Readings from Last 3 Encounters:  05/17/19 190 lb (86.2 kg)  05/05/19 190 lb (86.2 kg)  02/09/19 184 lb (83.5 kg)   Low back pain flare so requested telehealth visit  Allergies  Allergen Reactions  . Erythromycin Hives  . Statins     Lipitor  . Codeine Hives  . Hctz [Hydrochlorothiazide] Rash  . Lipitor [Atorvastatin] Other (See Comments)    Joint pain   Current Meds  Medication Sig  . acetaminophen (TYLENOL) 500 MG tablet Take 500 mg by mouth every 6 (six) hours as needed.  Marland Kitchen apixaban (ELIQUIS) 5 MG TABS tablet Take 1 tablet (5 mg total) by mouth 2 (two) times daily.  Marland Kitchen dicyclomine (BENTYL) 20 MG tablet TAKE 1 TABLET(20 MG) BY MOUTH THREE TIMES DAILY BEFORE MEALS  . diltiazem (CARDIZEM CD) 240 MG 24 hr capsule TAKE 1 CAPSULE BY MOUTH DAILY  . metoprolol  succinate (TOPROL-XL) 25 MG 24 hr tablet Take 1 tablet (25 mg total) by mouth daily. Take with or immediately following a meal.  . mirtazapine (REMERON) 15 MG tablet TAKE 1 TABLET(15 MG) BY MOUTH AT BEDTIME  . OVER THE COUNTER MEDICATION Tylenol Migraine , as needed  . pantoprazole (PROTONIX) 40 MG tablet TAKE 1 TABLET(40 MG) BY MOUTH DAILY BEFORE BREAKFAST  . rosuvastatin (CRESTOR) 20 MG tablet TAKE 1 TABLET BY MOUTH EVERY DAY.   Past Medical History:  Diagnosis Date  . Chronic headaches   . Depression   . Diastolic dysfunction    a. 12/2013 Echo: EF 60-65%, Gr1 DD, Ao sclerosis w/o stenosis, triv MR, mod dil LA, mild TR.  . Diverticulosis   . GERD (gastroesophageal reflux disease)   . History of hiatal hernia    with repair  . History of stress test    a. 10/2008 MV: Ef 75%, nl perfusion.  Marland Kitchen HTN (hypertension)   . Hyperlipidemia   . Migraine   . Obesity   . Osteopenia   . PAF (paroxysmal atrial fibrillation) ([place])    a. Anticoagulated w/ Eliquis (CHA2DS2VASc = 3).   Past Surgical History:  Procedure Laterality Date  . CATARACT EXTRACTION, BILATERAL    . COLONOSCOPY    . ELBOW SURGERY  left   Tendon Surgery  . ESOPHAGEAL MANOMETRY N/A 02/23/2018   Procedure: ESOPHAGEAL MANOMETRY (EM);  Surgeon: Mauri Pole, MD;  Location: [facility];  Service: Endoscopy;  Laterality: N/A;  . EYE SURGERY Left  L-yey myopia  . HERNIA REPAIR  [id number]   nissan fundiplication  . KNEE ARTHROSCOPY  2005   R-knee  . LAPAROSCOPIC NISSEN FUNDOPLICATION    . ROTATOR CUFF REPAIR Right   . TONSILLECTOMY    . TUBAL LIGATION    . UPPER GASTROINTESTINAL ENDOSCOPY     Social History   Social History Narrative   Divorced, 3 children lives alone   She is a retired Development worker, [employer] of [employer]   Former smoker no alcohol no drug use   family history includes Arthritis in her father; Asthma in her mother; COPD in her mother; Cancer in her father, sister, and son; Colon cancer  in her cousin; Crohn's disease in her daughter; Diabetes in her sister; Hypertension in her brother, father, and sister; Pancreatic cancer in an other family member; Prostate cancer in her father; Stroke in her father.   Review of Systems As above  PE  Looks more vigorous, better color NAD Alert and oriented and appropriate mood/afect  15 minutes time spent with patient > half in counseling coordination of care

## 2019-08-29 NOTE — Telephone Encounter (Signed)
Yes/yes 

## 2019-08-29 NOTE — Telephone Encounter (Signed)
Patient is calling stating that she can not make appointment today at 3:30 with Dr. Carlean Purl because her back is hurting really bad. She said she can barely lift her leg. Patient is asking if Dr. Carlean Purl could do a virtual visit- she has a few questions and is wondering if her lower back issues has to do with her bowel issues.

## 2019-08-29 NOTE — Telephone Encounter (Signed)
Dr. Carlean Purl are you okay with virtual?  Can we try the [facility] Video visit?

## 2019-08-30 ENCOUNTER — Telehealth: Payer: Self-pay | Admitting: Cardiology

## 2019-08-30 DIAGNOSIS — R609 Edema, unspecified: Secondary | ICD-10-CM

## 2019-08-30 MED ORDER — FUROSEMIDE 20 MG PO TABS: ORAL_TABLET | ORAL | 3 refills | Status: DC

## 2019-08-30 NOTE — Telephone Encounter (Signed)
Spoke with pt and has noted lower leg swelling for about 6 months left leg is worse. Per pt has stopped wearing the compression stockings due to hard to get on with having back problems Pt has been eating a lot of soups with digestive issues (vomitting and diarrhea) she has been having Pt will try and consume low salt soup. Per pt weight has gotten down to around 178 and since has gained some of the weight back. Pt feels the weight gain was related to medicines she was taking since then meds have been changed to avoid any further weight gain .Will forward to Dr Stanford Breed for review and recommendations ./cy

## 2019-08-30 NOTE — Telephone Encounter (Signed)
Lasix 20 mg daily as needed; bmet 2 weeks  Brian Crenshaw  

## 2019-08-30 NOTE — Telephone Encounter (Signed)
Spoke with pt, Aware of dr crenshaw's recommendations. New script sent to the pharmacy and Lab orders mailed to the pt  

## 2019-08-30 NOTE — Telephone Encounter (Signed)
Pt c/o swelling: STAT is pt has developed SOB within 24 hours  1) How much weight have you gained and in what time span? 10 lbs in 6 months   2) If swelling, where is the swelling located? Lower legs & left ankle   3) Are you currently taking a fluid pill? No  4) Are you currently SOB? No  5) Do you have a log of your daily weights (if so, list)? No  6) Have you gained 3 pounds in a day or 5 pounds in a week? No   7) Have you traveled recently? No   Patient called concerned about swelling in her legs wanting to know if she needed to see Dr. Stanford Breed in regards to it sooner than her scheduled 1 yr f/u on 12/05/19. Please advise.

## 2019-09-05 DIAGNOSIS — K582 Mixed irritable bowel syndrome: Secondary | ICD-10-CM | POA: Insufficient documentation

## 2019-09-05 DIAGNOSIS — R1110 Vomiting, unspecified: Secondary | ICD-10-CM | POA: Insufficient documentation

## 2019-09-05 NOTE — Patient Instructions (Signed)
Glad things are better overall.  Let's see how stopping dicyclomine does re: constipation.   I would like to see you again in late Jan or February.  I appreciate the opportunity to care for you. Gatha Mayer, MD, [facility]

## 2019-09-05 NOTE — Assessment & Plan Note (Addendum)
Now w/ constipation after diarrhea and incontinence Dc dicyclomine and reassess, ? Needs fiber/MiraLAx RTC Jan/Feb

## 2019-09-05 NOTE — Assessment & Plan Note (Signed)
improved

## 2019-09-05 NOTE — Assessment & Plan Note (Addendum)
Much beter on current regimen - suspect mirtazapine reason why If weight gain too much consider 7.7 mg mirtazapine

## 2019-09-05 NOTE — Assessment & Plan Note (Signed)
Mirtazapine helping

## 2019-10-13 ENCOUNTER — Other Ambulatory Visit: Payer: Self-pay | Admitting: Internal Medicine

## 2019-10-13 NOTE — Telephone Encounter (Signed)
Please advise Sir? 

## 2019-10-17 ENCOUNTER — Telehealth: Payer: Self-pay | Admitting: Internal Medicine

## 2019-10-17 NOTE — Telephone Encounter (Signed)
Refill both for 6 mos

## 2019-10-17 NOTE — Telephone Encounter (Signed)
Pt called back and stated pharmacy also needs authorization for Pantoprazole

## 2019-10-17 NOTE — Telephone Encounter (Signed)
Please advise Sir? 

## 2019-10-18 MED ORDER — PANTOPRAZOLE SODIUM 40 MG PO TBEC: DELAYED_RELEASE_TABLET | ORAL | 1 refills | Status: DC

## 2019-10-18 MED ORDER — MIRTAZAPINE 15 MG PO TABS: ORAL_TABLET | ORAL | 5 refills | Status: DC

## 2019-10-18 NOTE — Telephone Encounter (Signed)
Refill both x 6 mos

## 2019-10-18 NOTE — Telephone Encounter (Signed)
Refills sent in as requested and approved.

## 2019-10-26 ENCOUNTER — Ambulatory Visit: Payer: Medicare Other

## 2019-10-27 ENCOUNTER — Telehealth: Payer: Self-pay | Admitting: Internal Medicine

## 2019-10-27 NOTE — Telephone Encounter (Signed)
Probably best if orthopedist looked at this. I believe she sees Dr. [name].

## 2019-10-27 NOTE — Telephone Encounter (Signed)
Olivia Reynolds [phone]  Dariah called to say that about 6 months ago she slid off her bed and hit the metal frame and cut her back. Her back has continued to hurt instead of getting better, she also stated that the cut has not healed. She would like to come in for you to look at it. I let her know since it was so late on Friday afternoon that we do not have anymore appointments today and it would probably be Tuesday before she could be seen. She was okay with that because she has repair man coming today.

## 2019-10-27 NOTE — Telephone Encounter (Signed)
Called patient to let her know what Dr Renold Genta said, so she is going to call Dr Archie Endo office and schedule appointment.

## 2019-11-03 ENCOUNTER — Ambulatory Visit: Payer: Medicare HMO | Attending: Internal Medicine

## 2019-11-03 DIAGNOSIS — Z23 Encounter for immunization: Secondary | ICD-10-CM | POA: Insufficient documentation

## 2019-11-03 NOTE — Progress Notes (Signed)
   Covid-19 Vaccination Clinic  Name:  Olivia Reynolds    MRN: [id number] DOB: [birth year]  11/03/2019  [name] was observed post Covid-19 immunization for 15 minutes without incidence. She was provided with Vaccine Information Sheet and instruction to access the V-Safe system.   [name] was instructed to call 911 with any severe reactions post vaccine: Marland Kitchen Difficulty breathing  . Swelling of your face and throat  . A fast heartbeat  . A bad rash all over your body  . Dizziness and weakness    Immunizations Administered    Name Date Dose VIS Date Route   Pfizer COVID-19 Vaccine 11/03/2019  3:36 PM 0.3 mL 09/08/2019 Intramuscular   Manufacturer: [place]   Lot: [id number]   [place]: [id number]

## 2019-11-08 ENCOUNTER — Telehealth: Payer: Self-pay | Admitting: Internal Medicine

## 2019-11-08 NOTE — Telephone Encounter (Signed)
LVM to CB and schedule CPE with labs prior

## 2019-11-14 NOTE — Telephone Encounter (Signed)
Scheduled

## 2019-11-21 DIAGNOSIS — R609 Edema, unspecified: Secondary | ICD-10-CM | POA: Diagnosis not present

## 2019-11-22 LAB — BASIC METABOLIC PANEL
BUN/Creatinine Ratio: 16 (ref 12–28)
BUN: 15 mg/dL (ref 8–27)
CO2: 23 mmol/L (ref 20–29)
Calcium: 9.9 mg/dL (ref 8.7–10.3)
Chloride: 104 mmol/L (ref 96–106)
Creatinine, Ser: 0.93 mg/dL (ref 0.57–1.00)
GFR calc Af Amer: 73 mL/min/{1.73_m2} (ref >59)
GFR calc non Af Amer: 63 mL/min/{1.73_m2} (ref >59)
Glucose: 134 mg/dL — ABNORMAL HIGH (ref 65–99)
Potassium: 4.5 mmol/L (ref 3.5–5.2)
Sodium: 142 mmol/L (ref 134–144)

## 2019-11-23 ENCOUNTER — Telehealth: Payer: Self-pay | Admitting: Internal Medicine

## 2019-11-23 MED ORDER — METOPROLOL SUCCINATE ER 25 MG PO TB24: 25.0000 mg | ORAL_TABLET | Freq: Every day | ORAL | 3 refills | Status: DC

## 2019-11-23 NOTE — Telephone Encounter (Signed)
Received Fax RX request from  [facility], [place] Phone:  [phone]  Fax:  [phone]       Medication - metoprolol succinate (TOPROL-XL) 25 MG 24 hr tablet   Last Refill - 08/30/19  Last OV - 10/06/18  Last CPE - 10/06/18  Next Appointment - 12/28/19

## 2019-11-27 NOTE — Progress Notes (Signed)
HPI: FU atrial fibrillation. Monitor in June 2015 showed sinus rhythm.  Last echocardiogram April 2018 showed normal LV function, mild left ventricular hypertrophy, grade 1 diastolic dysfunction.  Nuclear study March 2019 showed ejection fraction 63% and probable shifting breast attenuation.  Since I last saw her,  she has dyspnea on exertion since having pneumonia in late 2019.  No orthopnea or PND.  She did have pedal edema which improved with Lasix.  No chest pain, palpitations or syncope.  Current Outpatient Medications  Medication Sig Dispense Refill  . acetaminophen (TYLENOL) 500 MG tablet Take 500 mg by mouth every 6 (six) hours as needed.    Marland Kitchen apixaban (ELIQUIS) 5 MG TABS tablet Take 1 tablet (5 mg total) by mouth 2 (two) times daily. 180 tablet 1  . diltiazem (CARDIZEM CD) 240 MG 24 hr capsule TAKE 1 CAPSULE BY MOUTH DAILY 90 capsule 3  . furosemide (LASIX) 20 MG tablet 1 tablet by mouth once daily as needed for swelling 90 tablet 3  . metoprolol succinate (TOPROL-XL) 25 MG 24 hr tablet Take 1 tablet (25 mg total) by mouth daily. Take with or immediately following a meal. 90 tablet 3  . mirtazapine (REMERON) 15 MG tablet TAKE 1 TABLET(15 MG) BY MOUTH AT BEDTIME 30 tablet 5  . OVER THE COUNTER MEDICATION Tylenol Migraine , as needed    . pantoprazole (PROTONIX) 40 MG tablet TAKE 1 TABLET(40 MG) BY MOUTH DAILY BEFORE BREAKFAST 90 tablet 1  . rosuvastatin (CRESTOR) 20 MG tablet TAKE 1 TABLET BY MOUTH EVERY DAY. 30 tablet 3   No current facility-administered medications for this visit.     Past Medical History:  Diagnosis Date  . Chronic headaches   . Depression   . Diastolic dysfunction    a. 12/2013 Echo: EF 60-65%, Gr1 DD, Ao sclerosis w/o stenosis, triv MR, mod dil LA, mild TR.  . Diverticulosis   . GERD (gastroesophageal reflux disease)   . History of hiatal hernia    with repair  . History of stress test    a. 10/2008 MV: Ef 75%, nl perfusion.  Marland Kitchen HTN (hypertension)    . Hyperlipidemia   . Migraine   . Obesity   . Osteopenia   . PAF (paroxysmal atrial fibrillation) ([place])    a. Anticoagulated w/ Eliquis (CHA2DS2VASc = 3).    Past Surgical History:  Procedure Laterality Date  . CATARACT EXTRACTION, BILATERAL    . COLONOSCOPY    . ELBOW SURGERY  left   Tendon Surgery  . ESOPHAGEAL MANOMETRY N/A 02/23/2018   Procedure: ESOPHAGEAL MANOMETRY (EM);  Surgeon: Mauri Pole, MD;  Location: [facility];  Service: Endoscopy;  Laterality: N/A;  . EYE SURGERY Left    L-yey myopia  . HERNIA REPAIR  [id number]   nissan fundiplication  . KNEE ARTHROSCOPY  2005   R-knee  . LAPAROSCOPIC NISSEN FUNDOPLICATION    . ROTATOR CUFF REPAIR Right   . TONSILLECTOMY    . TUBAL LIGATION    . UPPER GASTROINTESTINAL ENDOSCOPY      Social History   Socioeconomic History  . Marital status: Divorced    Spouse name: Not on file  . Number of children: 3  . Years of education: Not on file  . Highest education level: Not on file  Occupational History  . Occupation: [employer]    Comment: retired  Tobacco Use  . Smoking status: Former Smoker    Types: Cigarettes  Quit date: 09/07/1971    Years since quitting: 48.2  . Smokeless tobacco: Never Used  Substance and Sexual Activity  . Alcohol use: Not Currently  . Drug use: No  . Sexual activity: Not on file  Other Topics Concern  . Not on file  Social History Narrative   Divorced, 3 children lives alone   She is a retired Development worker, [employer]   Former smoker no alcohol no drug use   Social Determinants of Radio broadcast assistant Strain:   . Difficulty of Paying Living Expenses: Not on file  Food Insecurity:   . Worried About Charity fundraiser in the Last Year: Not on file  . Ran Out of Food in the Last Year: Not on file  Transportation Needs:   . Lack of Transportation (Medical): Not on file  . Lack of Transportation (Non-Medical): Not on file   Physical Activity:   . Days of Exercise per Week: Not on file  . Minutes of Exercise per Session: Not on file  Stress:   . Feeling of Stress : Not on file  Social Connections:   . Frequency of Communication with Friends and Family: Not on file  . Frequency of Social Gatherings with Friends and Family: Not on file  . Attends Religious Services: Not on file  . Active Member of Clubs or Organizations: Not on file  . Attends Archivist Meetings: Not on file  . Marital Status: Not on file  Intimate Partner Violence:   . Fear of Current or Ex-Partner: Not on file  . Emotionally Abused: Not on file  . Physically Abused: Not on file  . Sexually Abused: Not on file    Family History  Problem Relation Age of Onset  . COPD Mother   . Asthma Mother   . Arthritis Father   . Hypertension Father   . Stroke Father   . Cancer Father   . Prostate cancer Father   . Hypertension Sister   . Diabetes Sister   . Hypertension Brother   . Cancer Sister        UTERINE  . Cancer Son        TESTICULAR  . Crohn's disease Daughter   . Pancreatic cancer Other   . Colon cancer Cousin   . Esophageal cancer Neg Hx   . Stomach cancer Neg Hx   . Liver disease Neg Hx   . Rectal cancer Neg Hx     ROS: no fevers or chills, productive cough, hemoptysis, dysphasia, odynophagia, melena, hematochezia, dysuria, hematuria, rash, seizure activity, orthopnea, PND, pedal edema, claudication. Remaining systems are negative.  Physical Exam: Well-developed well-nourished in no acute distress.  Skin is warm and dry.  HEENT is normal.  Neck is supple.  Chest is clear to auscultation with normal expansion.  Cardiovascular exam is regular rate and rhythm.  Abdominal exam nontender or distended. No masses palpated. Extremities show no edema. neuro grossly intact  ECG-sinus rhythm at a rate of 95, no ST changes.  Personally reviewed  A/P  1 paroxysmal atrial fibrillation-patient is in sinus rhythm  today.  Continue Cardizem and low-dose metoprolol.  Continue apixaban.  Check hemoglobin and renal function.  2 hypertension-blood pressure controlled.  Continue present medical regimen.  3 hyperlipidemia-continue statin.  4 acute diastolic congestive heart failure-she has had recent mild volume excess that improved with Lasix.  We will continue with Lasix as needed.  Check CBC, bmet in BNP.  Kirk Ruths, MD

## 2019-11-28 ENCOUNTER — Ambulatory Visit: Payer: Medicare HMO | Attending: Internal Medicine

## 2019-11-28 DIAGNOSIS — Z23 Encounter for immunization: Secondary | ICD-10-CM | POA: Insufficient documentation

## 2019-11-28 NOTE — Progress Notes (Signed)
   Covid-19 Vaccination Clinic  Name:  Olivia Reynolds    MRN: [id number] DOB: [birth year]  11/28/2019  [name] was observed post Covid-19 immunization for 15 minutes without incident. She was provided with Vaccine Information Sheet and instruction to access the V-Safe system.   [name] was instructed to call 911 with any severe reactions post vaccine: Marland Kitchen Difficulty breathing  . Swelling of face and throat  . A fast heartbeat  . A bad rash all over body  . Dizziness and weakness   Immunizations Administered    Name Date Dose VIS Date Route   Pfizer COVID-19 Vaccine 11/28/2019  3:14 PM 0.3 mL 09/08/2019 Intramuscular   Manufacturer: [place]   Lot: [id number]   [place]: [id number]

## 2019-12-05 ENCOUNTER — Other Ambulatory Visit: Payer: Self-pay

## 2019-12-05 ENCOUNTER — Encounter: Payer: Self-pay | Admitting: Cardiology

## 2019-12-05 ENCOUNTER — Ambulatory Visit: Payer: Medicare HMO | Admitting: Cardiology

## 2019-12-05 VITALS — BP 142/80 | HR 95 | Temp 97.4°F | Ht 65.0 in | Wt 199.0 lb

## 2019-12-05 DIAGNOSIS — E7800 Pure hypercholesterolemia, unspecified: Secondary | ICD-10-CM

## 2019-12-05 DIAGNOSIS — I480 Paroxysmal atrial fibrillation: Secondary | ICD-10-CM | POA: Diagnosis not present

## 2019-12-05 DIAGNOSIS — I110 Hypertensive heart disease with heart failure: Secondary | ICD-10-CM | POA: Diagnosis not present

## 2019-12-05 DIAGNOSIS — I10 Essential (primary) hypertension: Secondary | ICD-10-CM

## 2019-12-05 DIAGNOSIS — I5031 Acute diastolic (congestive) heart failure: Secondary | ICD-10-CM | POA: Diagnosis not present

## 2019-12-05 DIAGNOSIS — R609 Edema, unspecified: Secondary | ICD-10-CM | POA: Diagnosis not present

## 2019-12-05 NOTE — Patient Instructions (Signed)
Medication Instructions:  NO CHANGE *If you need a refill on your cardiac medications before your next appointment, please call your pharmacy*   Lab Work: Your physician recommends that you return for lab work when convenient.  If you have labs (blood work) drawn today and your tests are completely normal, you will receive your results only by: Marland Kitchen MyChart Message (if you have MyChart) OR . A paper copy in the mail If you have any lab test that is abnormal or we need to change your treatment, we will call you to review the results.   Follow-Up: At [facility], you and your health needs are our priority.  As part of our continuing mission to provide you with exceptional heart care, we have created designated Provider Care Teams.  These Care Teams include your primary Cardiologist (physician) and Advanced Practice Providers (APPs -  Physician Assistants and Nurse Practitioners) who all work together to provide you with the care you need, when you need it.  We recommend signing up for the patient portal called "MyChart".  Sign up information is provided on this After Visit Summary.  MyChart is used to connect with patients for Virtual Visits (Telemedicine).  Patients are able to view lab/test results, encounter notes, upcoming appointments, etc.  Non-urgent messages can be sent to your provider as well.   To learn more about what you can do with MyChart, go to [email or web address].    Your next appointment:   12 month(s)  The format for your next appointment:   Either In Person or Virtual  Provider:   You may see Kirk Ruths MD or one of the following Advanced Practice Providers on your designated Care Team:    Kerin Ransom, PA-C  [place], [place]  Coletta Memos, [place]

## 2019-12-06 DIAGNOSIS — E7800 Pure hypercholesterolemia, unspecified: Secondary | ICD-10-CM | POA: Diagnosis not present

## 2019-12-06 DIAGNOSIS — I10 Essential (primary) hypertension: Secondary | ICD-10-CM | POA: Diagnosis not present

## 2019-12-06 DIAGNOSIS — I480 Paroxysmal atrial fibrillation: Secondary | ICD-10-CM | POA: Diagnosis not present

## 2019-12-06 DIAGNOSIS — R609 Edema, unspecified: Secondary | ICD-10-CM | POA: Diagnosis not present

## 2019-12-07 LAB — CBC
Hematocrit: 38.3 % (ref 34.0–46.6)
Hemoglobin: 12.8 g/dL (ref 11.1–15.9)
MCH: 30 pg (ref 26.6–33.0)
MCHC: 33.4 g/dL (ref 31.5–35.7)
MCV: 90 fL (ref 79–97)
Platelets: 319 10*3/uL (ref 150–450)
RBC: 4.27 x10E6/uL (ref 3.77–5.28)
RDW: 12.6 % (ref 11.7–15.4)
WBC: 8.6 10*3/uL (ref 3.4–10.8)

## 2019-12-07 LAB — BASIC METABOLIC PANEL
BUN/Creatinine Ratio: 19 (ref 12–28)
BUN: 14 mg/dL (ref 8–27)
CO2: 21 mmol/L (ref 20–29)
Calcium: 9.5 mg/dL (ref 8.7–10.3)
Chloride: 106 mmol/L (ref 96–106)
Creatinine, Ser: 0.75 mg/dL (ref 0.57–1.00)
GFR calc Af Amer: 95 mL/min/{1.73_m2} (ref >59)
GFR calc non Af Amer: 82 mL/min/{1.73_m2} (ref >59)
Glucose: 123 mg/dL — ABNORMAL HIGH (ref 65–99)
Potassium: 4.6 mmol/L (ref 3.5–5.2)
Sodium: 142 mmol/L (ref 134–144)

## 2019-12-07 LAB — PRO B NATRIURETIC PEPTIDE: NT-Pro BNP: 112 pg/mL (ref 0–301)

## 2019-12-15 ENCOUNTER — Other Ambulatory Visit: Payer: Self-pay

## 2019-12-15 MED ORDER — ROSUVASTATIN CALCIUM 20 MG PO TABS: ORAL_TABLET | ORAL | 3 refills | Status: DC

## 2019-12-25 ENCOUNTER — Other Ambulatory Visit: Payer: Medicare HMO | Admitting: Internal Medicine

## 2019-12-26 ENCOUNTER — Other Ambulatory Visit: Payer: Medicare HMO | Admitting: Internal Medicine

## 2019-12-26 ENCOUNTER — Other Ambulatory Visit: Payer: Self-pay

## 2019-12-26 DIAGNOSIS — F439 Reaction to severe stress, unspecified: Secondary | ICD-10-CM | POA: Diagnosis not present

## 2019-12-26 DIAGNOSIS — F3181 Bipolar II disorder: Secondary | ICD-10-CM | POA: Diagnosis not present

## 2019-12-26 DIAGNOSIS — K219 Gastro-esophageal reflux disease without esophagitis: Secondary | ICD-10-CM | POA: Diagnosis not present

## 2019-12-26 DIAGNOSIS — I10 Essential (primary) hypertension: Secondary | ICD-10-CM | POA: Diagnosis not present

## 2019-12-26 DIAGNOSIS — Z0000 Encounter for general adult medical examination without abnormal findings: Secondary | ICD-10-CM | POA: Diagnosis not present

## 2019-12-26 DIAGNOSIS — R7309 Other abnormal glucose: Secondary | ICD-10-CM | POA: Diagnosis not present

## 2019-12-26 DIAGNOSIS — E785 Hyperlipidemia, unspecified: Secondary | ICD-10-CM | POA: Diagnosis not present

## 2019-12-26 DIAGNOSIS — K589 Irritable bowel syndrome without diarrhea: Secondary | ICD-10-CM | POA: Diagnosis not present

## 2019-12-26 DIAGNOSIS — I480 Paroxysmal atrial fibrillation: Secondary | ICD-10-CM | POA: Diagnosis not present

## 2019-12-27 LAB — TSH: TSH: 3.58 mIU/L (ref 0.40–4.50)

## 2019-12-27 LAB — COMPLETE METABOLIC PANEL WITH GFR
AG Ratio: 1.6 (calc) (ref 1.0–2.5)
ALT: 26 U/L (ref 6–29)
AST: 24 U/L (ref 10–35)
Albumin: 4.4 g/dL (ref 3.6–5.1)
Alkaline phosphatase (APISO): 46 U/L (ref 37–153)
BUN: 17 mg/dL (ref 7–25)
CO2: 29 mmol/L (ref 20–32)
Calcium: 10 mg/dL (ref 8.6–10.4)
Chloride: 110 mmol/L (ref 98–110)
Creat: 0.77 mg/dL (ref 0.50–0.99)
GFR, Est African American: 92 mL/min/{1.73_m2} (ref >=60)
GFR, Est Non African American: 79 mL/min/{1.73_m2} (ref >=60)
Globulin: 2.7 g/dL (calc) (ref 1.9–3.7)
Glucose, Bld: 127 mg/dL — ABNORMAL HIGH (ref 65–99)
Potassium: 4.7 mmol/L (ref 3.5–5.3)
Sodium: 148 mmol/L — ABNORMAL HIGH (ref 135–146)
Total Bilirubin: 0.8 mg/dL (ref 0.2–1.2)
Total Protein: 7.1 g/dL (ref 6.1–8.1)

## 2019-12-27 LAB — HEMOGLOBIN A1C
Hgb A1c MFr Bld: 5.7 % of total Hgb — ABNORMAL HIGH (ref <5.7)
Mean Plasma Glucose: 117 (calc)
eAG (mmol/L): 6.5 (calc)

## 2019-12-27 LAB — LIPID PANEL
Cholesterol: 175 mg/dL (ref <200)
HDL: 52 mg/dL (ref >=50)
LDL Cholesterol (Calc): 100 mg/dL (calc) — ABNORMAL HIGH
Non-HDL Cholesterol (Calc): 123 mg/dL (calc) (ref <130)
Total CHOL/HDL Ratio: 3.4 (calc) (ref <5.0)
Triglycerides: 133 mg/dL (ref <150)

## 2019-12-28 ENCOUNTER — Encounter: Payer: Medicare HMO | Admitting: Internal Medicine

## 2020-01-12 ENCOUNTER — Other Ambulatory Visit: Payer: Self-pay

## 2020-01-12 DIAGNOSIS — I480 Paroxysmal atrial fibrillation: Secondary | ICD-10-CM

## 2020-01-12 MED ORDER — DILTIAZEM HCL ER COATED BEADS 240 MG PO CP24: 240.0000 mg | ORAL_CAPSULE | Freq: Every day | ORAL | 0 refills | Status: DC

## 2020-01-17 DIAGNOSIS — M5416 Radiculopathy, lumbar region: Secondary | ICD-10-CM | POA: Diagnosis not present

## 2020-01-19 ENCOUNTER — Other Ambulatory Visit: Payer: Self-pay | Admitting: Neurosurgery

## 2020-01-19 DIAGNOSIS — M5416 Radiculopathy, lumbar region: Secondary | ICD-10-CM

## 2020-01-29 ENCOUNTER — Encounter: Payer: Self-pay | Admitting: Internal Medicine

## 2020-01-29 ENCOUNTER — Ambulatory Visit (INDEPENDENT_AMBULATORY_CARE_PROVIDER_SITE_OTHER): Payer: Medicare HMO | Admitting: Internal Medicine

## 2020-01-29 ENCOUNTER — Other Ambulatory Visit: Payer: Self-pay

## 2020-01-29 VITALS — BP 130/88 | HR 88 | Temp 98.3°F | Ht 65.0 in | Wt 192.0 lb

## 2020-01-29 DIAGNOSIS — Z7901 Long term (current) use of anticoagulants: Secondary | ICD-10-CM | POA: Diagnosis not present

## 2020-01-29 DIAGNOSIS — Z8669 Personal history of other diseases of the nervous system and sense organs: Secondary | ICD-10-CM | POA: Diagnosis not present

## 2020-01-29 DIAGNOSIS — I10 Essential (primary) hypertension: Secondary | ICD-10-CM

## 2020-01-29 DIAGNOSIS — Z0000 Encounter for general adult medical examination without abnormal findings: Secondary | ICD-10-CM | POA: Diagnosis not present

## 2020-01-29 DIAGNOSIS — Z8719 Personal history of other diseases of the digestive system: Secondary | ICD-10-CM | POA: Diagnosis not present

## 2020-01-29 DIAGNOSIS — F5102 Adjustment insomnia: Secondary | ICD-10-CM

## 2020-01-29 DIAGNOSIS — E785 Hyperlipidemia, unspecified: Secondary | ICD-10-CM | POA: Diagnosis not present

## 2020-01-29 DIAGNOSIS — I480 Paroxysmal atrial fibrillation: Secondary | ICD-10-CM

## 2020-01-29 DIAGNOSIS — M5416 Radiculopathy, lumbar region: Secondary | ICD-10-CM

## 2020-01-29 DIAGNOSIS — M5116 Intervertebral disc disorders with radiculopathy, lumbar region: Secondary | ICD-10-CM | POA: Diagnosis not present

## 2020-01-29 MED ORDER — HYDROCODONE-ACETAMINOPHEN 10-325 MG PO TABS: 1.0000 | ORAL_TABLET | Freq: Three times a day (TID) | ORAL | 0 refills | Status: AC | PRN

## 2020-01-29 NOTE — Progress Notes (Signed)
Subjective:    Patient ID: Olivia Reynolds, female    DOB: 02-05-1951, 69 y.o.   MRN: [id number]  HPI 69 year old seen today in person for Medicare wellness exam and evaluation of medical issues. She is not able to cooperate with physical exam on the exam table due to severe back pain. She has had this for some 6 to 8 weeks and is seeing Dr. Trenton Gammon.  She has a history of GE reflux. She had fundoplication in the remote past. History of hyperplastic colon polyps. Had colonoscopy in 2013. Repeat study was recommended in 5 to 10 years. Last upper endoscopy was in 2018.  Is followed by cardiologist for paroxysmal atrial fibrillation and is on chronic anticoagulation.  History of hyperlipidemia and obesity.  History of knee surgery in 2004. Surgery for epicondylitis of the elbow in 2002.  Bilateral tubal ligation 1986. 3 pregnancies and no miscarriages.  Social history: She does not smoke. Social alcohol consumption consisting of wine. 3 adult children, 2 sons and a daughter. She is divorced and resides alone.  Family history: Daughter with history of Crohn's disease. Mother died of complications of COPD. Father deceased with history of dementia, hypertension and stroke. Sister with history of diabetes, hypertension and has had bariatric surgery. Another sister in good health. 1 brother with history of kidney stones, gout and allergies.    Review of Systems patient has issues with migraine headaches, anxiety and insomnia.     Objective:   Physical Exam  Blood pressure 130/88, pulse 88, pulse oximetry 96% temperature 98.3 degrees weight 192 pounds. BMI 31.95 Due to severe back pain-patient unable to cooperate with on the exam table physical examination. Her chest is clear. Cardiac exam regular rate and rhythm. Extremities without edema.    Assessment & Plan:  Hypertension-stable on metoprolol and diltiazem  BMI 31.95-continue diet and exercise efforts after back pain has improved  Large  hiatal hernia treated with PPI  History of paroxysmal atrial fibrillation treated with chronic anticoagulation-is in sinus rhythm today  History of depression treated with Zoloft  History of migraine headaches  History of insomnia treated with Valium as needed  Hyperlipidemia treated with Crestor and lipid panel within normal limits  Impaired glucose tolerance-treated with diet hemoglobin A1c is 5.7%  Back and right lower extremity radiculopathy seen by Dr. Trenton Gammon. Recently had MRI of the LS spine. Dr. 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 On the right at L4-L5 she has some lateral recess stenosis with some compression of the right-sided L5 nerve root. Epidural steroid injections were recommended.  We will need to schedule her health maintenance exam in 6 months. We will proceed today with Medicare wellness visit.  Subjective:   Patient presents for Medicare Annual/Subsequent preventive examination.  Review Past Medical/Family/Social:   Risk Factors  Current exercise habits:  Dietary issues discussed:   Cardiac risk factors:  Depression Screen  (Note: if answer to either of the following is "Yes", a more complete depression screening is indicated)   Over the past two weeks, have you felt down, depressed or hopeless? No  Over the past two weeks, have you felt little interest or pleasure in doing things? No Have you lost interest or pleasure in daily life? Yes due to severe back pain Do you often feel hopeless? No Do you cry easily over simple problems? No   Activities of Daily Living  In your present state of health, do  you have any difficulty performing the following activities?:   Driving? No consent for back pain Managing money? No  Feeding yourself? No  Getting from bed to chair? Yes due to back pain Climbing a flight of stairs? Yes due to back pain Preparing food and eating?: Yes due to back  pain Bathing or showering? Yes due to back pain getting dressed: Yes due to back pain Getting to the toilet? Yes due to back pain Using the toilet:No  Moving around from place to place: Yes due to back pain In the past year have you fallen or had a near fall?: Yes Are you sexually active? No  Do you have more than one partner? No   Hearing Difficulties: No  Do you often ask people to speak up or repeat themselves? No  Do you experience ringing or noises in your ears? No  Do you have difficulty understanding soft or whispered voices? No  Do you feel that you have a problem with memory? No Do you often misplace items? No    Home Safety:  Do you have a smoke alarm at your residence? Yes Do you have grab bars in the bathroom? Yes Do you have throw rugs in your house? None   Cognitive Testing  Alert? Yes Normal Appearance?Yes  Oriented to person? Yes Place? Yes  Time? Yes  Recall of three objects? Yes  Can perform simple calculations? Yes  Displays appropriate judgment?Yes  Can read the correct time from a watch face?Yes   List the Names of Other Physician/Practitioners you currently use:  See referral list for the physicians patient is currently seeing.  Dr. Casandra Doffing   Review of Systems: See above   Objective:    Unable to fully examine due to severe low back pain General appearance: Appears stated age and mildly obese  Head: Normocephalic, without obvious abnormality, atraumatic  Neck: no adenopathy, no carotid bruit, no JVD, supple, symmetrical, trachea midline and thyroid not enlarged, symmetric, no tenderness/mass/nodules  No CVA tenderness.  Lungs: clear to auscultation bilaterally  Breasts: Not examined due to severe back pain and patient unable to get on exam table Heart: regular rate and rhythm, S1, S2 normal, no murmur, click, rub or gallop  Abdomen: No masses Musculoskeletal. Extreme low back pain cannot examine Skin: Skin color, texture, turgor  normal. No rashes or lesions  Lymph nodes: Cervical, supraclavicular, and axillary nodes normal.  Neurologic: CN 2 -12 Normal Right lumbar radiculopathy. Moves very slowly due to back pain Psych: Alert & Oriented x 3, Mood appear stable.    Assessment:    Annual wellness medicare exam   Plan:    During the course of the visit the patient was educated and counseled about appropriate screening and preventive services including:   Annual flu vaccine  Has had 2 COVID-19 vaccines     Patient Instructions (the written plan) was given to the patient.  Medicare Attestation  I have personally reviewed:  The patient's medical and social history  Their use of alcohol, tobacco or illicit drugs  Their current medications and supplements  The patient's functional ability including ADLs,fall risks, home safety risks, cognitive, and hearing and visual impairment  Diet and physical activities  Evidence for depression or mood disorders  The patient's weight, height, BMI, and visual acuity have been recorded in the chart. I have made referrals, counseling, and provided education to the patient based on review of the above and I have provided the patient with a written personalized care  plan for preventive services.

## 2020-02-01 DIAGNOSIS — I10 Essential (primary) hypertension: Secondary | ICD-10-CM | POA: Diagnosis not present

## 2020-02-01 DIAGNOSIS — Z6832 Body mass index (BMI) 32.0-32.9, adult: Secondary | ICD-10-CM | POA: Diagnosis not present

## 2020-02-01 DIAGNOSIS — M5416 Radiculopathy, lumbar region: Secondary | ICD-10-CM | POA: Diagnosis not present

## 2020-02-16 ENCOUNTER — Telehealth: Payer: Self-pay

## 2020-02-16 NOTE — Telephone Encounter (Signed)
   [facility] Pre-operative Risk Assessment     Request for surgical clearance:  1. What type of surgery is being performed? Spinal Injection    2. When is this surgery scheduled? TBD   3. What type of clearance is required (medical clearance vs. Pharmacy clearance to hold med vs. Both)? Pharmacy  4. Are there any medications that need to be held prior to surgery and how long? Eliquis for 3-5 days    5. Practice name and name of physician performing surgery? [facility] Dr. Brien Few   6. What is the office phone number? [phone]   7.   What is the office fax number? [phone]  8.   Anesthesia type (None, local, MAC, general) ?  Local

## 2020-02-20 NOTE — Telephone Encounter (Signed)
Patient with diagnosis of afib on Eliquis for anticoagulation.    Procedure: Spinal Injection  Date of procedure: TBD  CHADS2-VASc score of  4 (CHF, HTN, AGE, female)  CrCl 75 ml/min  Per office protocol, patient can hold Eliquis for 3 days prior to procedure.

## 2020-02-20 NOTE — Telephone Encounter (Signed)
   Primary Cardiologist: Kirk Ruths, MD  Chart reviewed as part of pre-operative protocol coverage. Given past medical history and time since last visit, based on ACC/AHA guidelines, Olivia Reynolds would be at acceptable risk for the planned procedure without further cardiovascular testing.   Patient with diagnosis of afib on Eliquis for anticoagulation.    Procedure: Spinal Injection Date of procedure: TBD  CHADS2-VASc score of  4 (CHF, HTN, AGE, female)  CrCl 75 ml/min  Per office protocol, patient can hold Eliquis for 3 days prior to procedure.  I will route this recommendation to the requesting party via Epic fax function and remove from pre-op pool.  Please call with questions.  [name]. Gaytha Raybourn NP-C    02/20/2020, 9:27 AM [place] [address] Office [phone] Fax [phone]

## 2020-02-25 ENCOUNTER — Telehealth: Payer: Self-pay | Admitting: Internal Medicine

## 2020-02-25 DIAGNOSIS — I480 Paroxysmal atrial fibrillation: Secondary | ICD-10-CM

## 2020-02-25 MED ORDER — DILTIAZEM HCL ER COATED BEADS 240 MG PO CP24: 240.0000 mg | ORAL_CAPSULE | Freq: Every day | ORAL | 3 refills | Status: DC

## 2020-02-25 NOTE — Patient Instructions (Signed)
We are sorry you are having such a hard time with back pain. Hopefully epidural steroids will help. Continue current medications and return in 6 months for your routine physical exam and fasting labs. Medicare wellness visit done today along with office visit.

## 2020-02-25 NOTE — Telephone Encounter (Signed)
Refill Diltiazem 240 mg for one year to [facility]

## 2020-03-05 ENCOUNTER — Other Ambulatory Visit: Payer: Self-pay | Admitting: Pharmacist

## 2020-03-05 DIAGNOSIS — M5416 Radiculopathy, lumbar region: Secondary | ICD-10-CM | POA: Diagnosis not present

## 2020-03-05 MED ORDER — APIXABAN 5 MG PO TABS: 5.0000 mg | ORAL_TABLET | Freq: Two times a day (BID) | ORAL | 1 refills | Status: DC

## 2020-04-04 ENCOUNTER — Other Ambulatory Visit: Payer: Self-pay | Admitting: Internal Medicine

## 2020-04-04 NOTE — Telephone Encounter (Signed)
Please advise Sir, thank you. 

## 2020-04-17 ENCOUNTER — Other Ambulatory Visit: Payer: Self-pay | Admitting: Cardiology

## 2020-04-17 NOTE — Telephone Encounter (Signed)
Rx has been sent to the pharmacy electronically. ° °

## 2020-04-23 NOTE — Telephone Encounter (Signed)
Left detailed message for "[facility]" Dr Lauris Poag assistant. refaxed to [place] fax function

## 2020-05-04 ENCOUNTER — Other Ambulatory Visit: Payer: Self-pay | Admitting: Internal Medicine

## 2020-05-07 DIAGNOSIS — M5416 Radiculopathy, lumbar region: Secondary | ICD-10-CM | POA: Diagnosis not present

## 2020-05-28 ENCOUNTER — Other Ambulatory Visit: Payer: Self-pay | Admitting: Internal Medicine

## 2020-05-28 NOTE — Telephone Encounter (Signed)
Received Fax RX request from  [facility] [place], [place] Phone:  [phone]  Fax:  [phone]       Medication - DIAZEPAM 10MG  TABLETS  Last Refill - 05/04/19  Last OV - 01/29/2020  Last CPE - 01/29/2020  Next Appointment -

## 2020-05-28 NOTE — Telephone Encounter (Signed)
I believe she was to book CPE November. Had wellness visit in May. Please book before refilling.

## 2020-05-28 NOTE — Telephone Encounter (Signed)
CPE scheduled  

## 2020-05-29 MED ORDER — DIAZEPAM 10 MG PO TABS
10.0000 mg | ORAL_TABLET | Freq: Two times a day (BID) | ORAL | 0 refills | Status: DC | PRN
Start: 2020-05-29 — End: 2020-07-21

## 2020-05-29 NOTE — Telephone Encounter (Signed)
Patient states she needs a refill on diazepam to help her relax and help her sleep since she is having back issues.

## 2020-05-29 NOTE — Addendum Note (Signed)
Addended by: Mady Haagensen on: 05/29/2020 10:23 AM   Modules accepted: Orders

## 2020-05-29 NOTE — Telephone Encounter (Signed)
Left message this rx is not on her med list, did Dr. Renold Genta prescribe? Needs to call back.   FYI

## 2020-05-30 ENCOUNTER — Telehealth: Payer: Self-pay | Admitting: Internal Medicine

## 2020-06-01 DIAGNOSIS — Z20822 Contact with and (suspected) exposure to covid-19: Secondary | ICD-10-CM | POA: Diagnosis not present

## 2020-06-04 ENCOUNTER — Ambulatory Visit: Payer: Medicare HMO | Admitting: Internal Medicine

## 2020-06-11 ENCOUNTER — Telehealth: Payer: Self-pay | Admitting: Internal Medicine

## 2020-06-11 NOTE — Telephone Encounter (Signed)
Patient called to let us know that she was not able to come in this evening because her daughter was still at her doctor's appointment and patient was baby sitting I offered her an appt for tomorrow 9/15 and she declined it I told her we are going to be seeing patient tomorrow until 12:30pm if she wanted to come in before 12:30pm she said she was going to call back tomorrow.

## 2020-06-11 NOTE — Telephone Encounter (Signed)
Olivia Reynolds [phone]  Aundreya called to say she has been fatigued for over a week, just not feeling herself, however the last couple of days she has started having runny nose, sore throat, dry cough, right ear feels stopped up and itchy skin. She has been keeping her grandchildren and one of them has been diagnosed with RSV.

## 2020-06-11 NOTE — Telephone Encounter (Signed)
After speaking with Dr Renold Genta I offered a car visit for this afternoon at 4:45, she stated she was keeping one of her grandchildren and was waiting on her daughter to get back from doctor. So she did not want to schedule a visit till she heard from her daughter.

## 2020-06-12 NOTE — Telephone Encounter (Signed)
error 

## 2020-06-22 DIAGNOSIS — Z20822 Contact with and (suspected) exposure to covid-19: Secondary | ICD-10-CM | POA: Diagnosis not present

## 2020-07-09 ENCOUNTER — Encounter: Payer: Self-pay | Admitting: Internal Medicine

## 2020-07-09 ENCOUNTER — Other Ambulatory Visit: Payer: Self-pay

## 2020-07-09 ENCOUNTER — Ambulatory Visit (INDEPENDENT_AMBULATORY_CARE_PROVIDER_SITE_OTHER): Payer: Medicare HMO | Admitting: Internal Medicine

## 2020-07-09 VITALS — BP 160/90 | HR 92 | Temp 98.2°F | Ht 65.0 in | Wt 192.0 lb

## 2020-07-09 DIAGNOSIS — F411 Generalized anxiety disorder: Secondary | ICD-10-CM | POA: Diagnosis not present

## 2020-07-09 DIAGNOSIS — Z0000 Encounter for general adult medical examination without abnormal findings: Secondary | ICD-10-CM

## 2020-07-09 DIAGNOSIS — Z23 Encounter for immunization: Secondary | ICD-10-CM

## 2020-07-15 ENCOUNTER — Ambulatory Visit: Payer: Medicare HMO | Admitting: Internal Medicine

## 2020-07-18 ENCOUNTER — Encounter: Payer: Self-pay | Admitting: Internal Medicine

## 2020-07-18 DIAGNOSIS — N6489 Other specified disorders of breast: Secondary | ICD-10-CM | POA: Diagnosis not present

## 2020-07-18 DIAGNOSIS — R928 Other abnormal and inconclusive findings on diagnostic imaging of breast: Secondary | ICD-10-CM | POA: Diagnosis not present

## 2020-07-21 ENCOUNTER — Encounter: Payer: Self-pay | Admitting: Internal Medicine

## 2020-07-21 NOTE — Progress Notes (Addendum)
   Subjective:    Patient ID: Olivia Reynolds, female    DOB: [birth year], 69 y.o.   MRN: [id number]  HPI 69 year old Female concerned about abnormality she feels in left  axilla. She would like diagnostic mammogram.office visit was advised today to examine breasts as she has concerns.  Patient has PAF and is on chronic anticoagulation. Hx hyperlipidemia and obesity.Hx hyperplastic colon polyps. Hx GERD. In Spring 2021 had severe back pain seen by Dr. Trenton Gammon. Did not need surgery.Hx migraine headaches.Hx anxiety and insomnia.Hx HTN. Hx large hiatal hernia. Hx impaired glucose tolerance and obesity.Hx of depression. Now on Remeron. It also helped her IBS and intractable vomiting.   Review of Systems see above.     Objective:   Physical Exam  BP 160/90 but patient is anxious today.  Pulse 92, temperature 98.2 degrees pulse oximetry 97% BMI 31.95  Skin warm and dry.  Both axillae examined and no adenopathy is found.  Bilateral breast exam was performed.  I do not feel any significant masses or tenderness.  Patient thinks there is fullness in left axillary area.I do not appreciate this.      Assessment & Plan:  Patient concerned about abnormality in left breast.  I do not appreciate this abnormality but we are going to order diagnostic mammogram to ease her mind.  Pneumococcal 23 vaccine given today.  She has had 2 COVID-19 vaccines.  Will get influenza vaccine later.

## 2020-07-21 NOTE — Patient Instructions (Addendum)
Diagnostic mammogram ordered as patient requested.  Pneumococcal 23 vaccine given.

## 2020-07-29 NOTE — Progress Notes (Signed)
HPI: FUatrialfibrillation. Monitor in June 2015 showed sinus rhythm.Last echocardiogram April 2018 showed normal LV function, mild left ventricular hypertrophy, grade 1 diastolic dysfunction. Nuclear study March 2019 showed ejection fraction 63% and probable shifting breast attenuation. Since I last saw her, she denies any dyspnea, chest pain, palpitations or syncope.  Current Outpatient Medications  Medication Sig Dispense Refill  . acetaminophen (TYLENOL) 500 MG tablet Take 500 mg by mouth every 6 (six) hours as needed.    Marland Kitchen apixaban (ELIQUIS) 5 MG TABS tablet Take 1 tablet (5 mg total) by mouth 2 (two) times daily. 180 tablet 1  . diltiazem (CARDIZEM CD) 240 MG 24 hr capsule Take 1 capsule (240 mg total) by mouth daily. 90 capsule 3  . furosemide (LASIX) 20 MG tablet 1 tablet by mouth once daily as needed for swelling 90 tablet 3  . metoprolol succinate (TOPROL-XL) 25 MG 24 hr tablet Take 1 tablet (25 mg total) by mouth daily. Take with or immediately following a meal. 90 tablet 3  . mirtazapine (REMERON) 15 MG tablet TAKE 1 TABLET(15 MG) BY MOUTH AT BEDTIME 30 tablet 5  . OVER THE COUNTER MEDICATION Tylenol Migraine , as needed    . pantoprazole (PROTONIX) 40 MG tablet TAKE 1 TABLET(40 MG) BY MOUTH DAILY BEFORE BREAKFAST 90 tablet 1  . rosuvastatin (CRESTOR) 20 MG tablet TAKE 1 TABLET BY MOUTH EVERY DAY 90 tablet 2   No current facility-administered medications for this visit.     Past Medical History:  Diagnosis Date  . Chronic headaches   . Depression   . Diastolic dysfunction    a. 12/2013 Echo: EF 60-65%, Gr1 DD, Ao sclerosis w/o stenosis, triv MR, mod dil LA, mild TR.  . Diverticulosis   . GERD (gastroesophageal reflux disease)   . History of hiatal hernia    with repair  . History of stress test    a. 10/2008 MV: Ef 75%, nl perfusion.  Marland Kitchen HTN (hypertension)   . Hyperlipidemia   . Migraine   . Obesity   . Osteopenia   . PAF (paroxysmal atrial fibrillation)  ([place])    a. Anticoagulated w/ Eliquis (CHA2DS2VASc = 3).    Past Surgical History:  Procedure Laterality Date  . CATARACT EXTRACTION, BILATERAL    . COLONOSCOPY    . ELBOW SURGERY  left   Tendon Surgery  . ESOPHAGEAL MANOMETRY N/A 02/23/2018   Procedure: ESOPHAGEAL MANOMETRY (EM);  Surgeon: Mauri Pole, MD;  Location: [facility];  Service: Endoscopy;  Laterality: N/A;  . EYE SURGERY Left    L-yey myopia  . HERNIA REPAIR  3220   nissan fundiplication  . KNEE ARTHROSCOPY  2005   R-knee  . LAPAROSCOPIC NISSEN FUNDOPLICATION    . ROTATOR CUFF REPAIR Right   . TONSILLECTOMY    . TUBAL LIGATION    . UPPER GASTROINTESTINAL ENDOSCOPY      Social History   Socioeconomic History  . Marital status: Divorced    Spouse name: Not on file  . Number of children: 3  . Years of education: Not on file  . Highest education level: Not on file  Occupational History  . Occupation: [employer]    Comment: retired  Tobacco Use  . Smoking status: Former Smoker    Types: Cigarettes    Quit date: 09/07/1971    Years since quitting: 48.9  . Smokeless tobacco: Never Used  Vaping Use  . Vaping Use: Never used  Substance  and Sexual Activity  . Alcohol use: Not Currently  . Drug use: No  . Sexual activity: Not on file  Other Topics Concern  . Not on file  Social History Narrative   Divorced, 3 children lives alone   She is a retired Development worker, [employer]   Former smoker no alcohol no drug use   Social Determinants of Radio broadcast assistant Strain:   . Difficulty of Paying Living Expenses: Not on file  Food Insecurity:   . Worried About Charity fundraiser in the Last Year: Not on file  . Ran Out of Food in the Last Year: Not on file  Transportation Needs:   . Lack of Transportation (Medical): Not on file  . Lack of Transportation (Non-Medical): Not on file  Physical Activity:   . Days of Exercise per Week: Not on file   . Minutes of Exercise per Session: Not on file  Stress:   . Feeling of Stress : Not on file  Social Connections:   . Frequency of Communication with Friends and Family: Not on file  . Frequency of Social Gatherings with Friends and Family: Not on file  . Attends Religious Services: Not on file  . Active Member of Clubs or Organizations: Not on file  . Attends Archivist Meetings: Not on file  . Marital Status: Not on file  Intimate Partner Violence:   . Fear of Current or Ex-Partner: Not on file  . Emotionally Abused: Not on file  . Physically Abused: Not on file  . Sexually Abused: Not on file    Family History  Problem Relation Age of Onset  . COPD Mother   . Asthma Mother   . Arthritis Father   . Hypertension Father   . Stroke Father   . Cancer Father   . Prostate cancer Father   . Hypertension Sister   . Diabetes Sister   . Hypertension Brother   . Cancer Sister        UTERINE  . Cancer Son        TESTICULAR  . Crohn's disease Daughter   . Pancreatic cancer Other   . Colon cancer Cousin   . Esophageal cancer Neg Hx   . Stomach cancer Neg Hx   . Liver disease Neg Hx   . Rectal cancer Neg Hx     ROS: Back pain but no fevers or chills, productive cough, hemoptysis, dysphasia, odynophagia, melena, hematochezia, dysuria, hematuria, rash, seizure activity, orthopnea, PND, pedal edema, claudication. Remaining systems are negative.  Physical Exam: Well-developed well-nourished in no acute distress.  Skin is warm and dry.  HEENT is normal.  Neck is supple.  Chest is clear to auscultation with normal expansion.  Cardiovascular exam is regular rate and rhythm.  Abdominal exam nontender or distended. No masses palpated. Extremities show no edema. neuro grossly intact  A/P  1 paroxysmal atrial fibrillation-patient is in sinus today on exam. Continue Cardizem and metoprolol at present dose. Continue apixaban.   2 chronic diastolic congestive heart  failure-continue Lasix as needed.   3 hypertension-blood pressure is controlled today. Continue present medications and follow.  4 hyperlipidemia-continue statin.  Kirk Ruths, MD

## 2020-08-05 ENCOUNTER — Ambulatory Visit: Payer: Medicare HMO | Admitting: Cardiology

## 2020-08-05 ENCOUNTER — Encounter: Payer: Self-pay | Admitting: Cardiology

## 2020-08-05 ENCOUNTER — Other Ambulatory Visit: Payer: Self-pay

## 2020-08-05 VITALS — BP 137/73 | HR 67 | Temp 97.2°F | Ht 65.0 in | Wt 196.0 lb

## 2020-08-05 DIAGNOSIS — I480 Paroxysmal atrial fibrillation: Secondary | ICD-10-CM

## 2020-08-05 DIAGNOSIS — E7800 Pure hypercholesterolemia, unspecified: Secondary | ICD-10-CM | POA: Diagnosis not present

## 2020-08-05 DIAGNOSIS — I110 Hypertensive heart disease with heart failure: Secondary | ICD-10-CM | POA: Diagnosis not present

## 2020-08-05 DIAGNOSIS — I10 Essential (primary) hypertension: Secondary | ICD-10-CM | POA: Diagnosis not present

## 2020-08-05 DIAGNOSIS — I5032 Chronic diastolic (congestive) heart failure: Secondary | ICD-10-CM | POA: Diagnosis not present

## 2020-08-05 NOTE — Patient Instructions (Signed)

## 2020-08-06 ENCOUNTER — Other Ambulatory Visit: Payer: Medicare HMO | Admitting: Internal Medicine

## 2020-08-06 DIAGNOSIS — Z0000 Encounter for general adult medical examination without abnormal findings: Secondary | ICD-10-CM

## 2020-08-06 DIAGNOSIS — F5102 Adjustment insomnia: Secondary | ICD-10-CM

## 2020-08-06 DIAGNOSIS — M5416 Radiculopathy, lumbar region: Secondary | ICD-10-CM | POA: Diagnosis not present

## 2020-08-06 DIAGNOSIS — I10 Essential (primary) hypertension: Secondary | ICD-10-CM

## 2020-08-06 DIAGNOSIS — E785 Hyperlipidemia, unspecified: Secondary | ICD-10-CM

## 2020-08-06 DIAGNOSIS — M8580 Other specified disorders of bone density and structure, unspecified site: Secondary | ICD-10-CM | POA: Diagnosis not present

## 2020-08-06 DIAGNOSIS — I480 Paroxysmal atrial fibrillation: Secondary | ICD-10-CM | POA: Diagnosis not present

## 2020-08-06 DIAGNOSIS — Z7901 Long term (current) use of anticoagulants: Secondary | ICD-10-CM

## 2020-08-07 LAB — COMPLETE METABOLIC PANEL WITH GFR
AG Ratio: 1.9 (calc) (ref 1.0–2.5)
ALT: 22 U/L (ref 6–29)
AST: 22 U/L (ref 10–35)
Albumin: 4.1 g/dL (ref 3.6–5.1)
Alkaline phosphatase (APISO): 44 U/L (ref 37–153)
BUN: 12 mg/dL (ref 7–25)
CO2: 26 mmol/L (ref 20–32)
Calcium: 9.3 mg/dL (ref 8.6–10.4)
Chloride: 106 mmol/L (ref 98–110)
Creat: 0.69 mg/dL (ref 0.50–0.99)
GFR, Est African American: 103 mL/min/{1.73_m2} (ref >=60)
GFR, Est Non African American: 89 mL/min/{1.73_m2} (ref >=60)
Globulin: 2.2 g/dL (calc) (ref 1.9–3.7)
Glucose, Bld: 115 mg/dL — ABNORMAL HIGH (ref 65–99)
Potassium: 3.9 mmol/L (ref 3.5–5.3)
Sodium: 141 mmol/L (ref 135–146)
Total Bilirubin: 1.3 mg/dL — ABNORMAL HIGH (ref 0.2–1.2)
Total Protein: 6.3 g/dL (ref 6.1–8.1)

## 2020-08-07 LAB — HEMOGLOBIN A1C
Hgb A1c MFr Bld: 5.7 % of total Hgb — ABNORMAL HIGH (ref <5.7)
Mean Plasma Glucose: 117 (calc)
eAG (mmol/L): 6.5 (calc)

## 2020-08-07 LAB — CBC WITH DIFFERENTIAL/PLATELET
Absolute Monocytes: 497 cells/uL (ref 200–950)
Basophils Absolute: 70 cells/uL (ref 0–200)
Basophils Relative: 1 %
Eosinophils Absolute: 441 cells/uL (ref 15–500)
Eosinophils Relative: 6.3 %
HCT: 39 % (ref 35.0–45.0)
Hemoglobin: 13 g/dL (ref 11.7–15.5)
Lymphs Abs: 2471 cells/uL (ref 850–3900)
MCH: 30.2 pg (ref 27.0–33.0)
MCHC: 33.3 g/dL (ref 32.0–36.0)
MCV: 90.5 fL (ref 80.0–100.0)
MPV: 9.5 fL (ref 7.5–12.5)
Monocytes Relative: 7.1 %
Neutro Abs: 3521 cells/uL (ref 1500–7800)
Neutrophils Relative %: 50.3 %
Platelets: 317 10*3/uL (ref 140–400)
RBC: 4.31 10*6/uL (ref 3.80–5.10)
RDW: 12.1 % (ref 11.0–15.0)
Total Lymphocyte: 35.3 %
WBC: 7 10*3/uL (ref 3.8–10.8)

## 2020-08-07 LAB — LIPID PANEL
Cholesterol: 152 mg/dL (ref <200)
HDL: 41 mg/dL — ABNORMAL LOW (ref >=50)
LDL Cholesterol (Calc): 85 mg/dL (calc)
Non-HDL Cholesterol (Calc): 111 mg/dL (calc) (ref <130)
Total CHOL/HDL Ratio: 3.7 (calc) (ref <5.0)
Triglycerides: 159 mg/dL — ABNORMAL HIGH (ref <150)

## 2020-08-07 LAB — TSH: TSH: 1.78 mIU/L (ref 0.40–4.50)

## 2020-08-08 ENCOUNTER — Encounter: Payer: Self-pay | Admitting: Internal Medicine

## 2020-08-08 ENCOUNTER — Ambulatory Visit (INDEPENDENT_AMBULATORY_CARE_PROVIDER_SITE_OTHER): Payer: Medicare HMO | Admitting: Internal Medicine

## 2020-08-08 ENCOUNTER — Other Ambulatory Visit: Payer: Self-pay

## 2020-08-08 ENCOUNTER — Telehealth: Payer: Self-pay | Admitting: Internal Medicine

## 2020-08-08 VITALS — BP 130/80 | HR 81 | Ht 64.0 in | Wt 195.0 lb

## 2020-08-08 DIAGNOSIS — F5102 Adjustment insomnia: Secondary | ICD-10-CM

## 2020-08-08 DIAGNOSIS — Z0000 Encounter for general adult medical examination without abnormal findings: Secondary | ICD-10-CM

## 2020-08-08 DIAGNOSIS — Z7901 Long term (current) use of anticoagulants: Secondary | ICD-10-CM

## 2020-08-08 DIAGNOSIS — I10 Essential (primary) hypertension: Secondary | ICD-10-CM | POA: Diagnosis not present

## 2020-08-08 DIAGNOSIS — K219 Gastro-esophageal reflux disease without esophagitis: Secondary | ICD-10-CM

## 2020-08-08 DIAGNOSIS — Z8669 Personal history of other diseases of the nervous system and sense organs: Secondary | ICD-10-CM

## 2020-08-08 DIAGNOSIS — F439 Reaction to severe stress, unspecified: Secondary | ICD-10-CM

## 2020-08-08 DIAGNOSIS — K449 Diaphragmatic hernia without obstruction or gangrene: Secondary | ICD-10-CM | POA: Diagnosis not present

## 2020-08-08 DIAGNOSIS — I480 Paroxysmal atrial fibrillation: Secondary | ICD-10-CM | POA: Diagnosis not present

## 2020-08-08 DIAGNOSIS — F411 Generalized anxiety disorder: Secondary | ICD-10-CM | POA: Diagnosis not present

## 2020-08-08 DIAGNOSIS — Z6833 Body mass index (BMI) 33.0-33.9, adult: Secondary | ICD-10-CM

## 2020-08-08 DIAGNOSIS — R748 Abnormal levels of other serum enzymes: Secondary | ICD-10-CM

## 2020-08-08 NOTE — Telephone Encounter (Signed)
Olivia Reynolds [phone]  [name] called to say she just received a phone call from her son-in-law and he has just tested positive for COVID with a rapid test. He is coughing. He was at her house 2 days ago, while he was there, they wore their mask and stayed 6 foot apart. She is not having any symptoms at this time. She wanted to call and let us know since she had just left our office.

## 2020-08-08 NOTE — Telephone Encounter (Signed)
After speaking with Dr Renold Genta I called patient back and let her know that she could come in to office tomorrow for a car visit and be tested for COVID-19. She stated that she had some work being done at the house and could not leave. She did let me know she had an N95 mask on when her son-in-law was at the house, and she was not having symptoms. She will call back if she starts having symptoms.

## 2020-08-08 NOTE — Progress Notes (Signed)
Subjective:    Patient ID: Olivia Reynolds, female    DOB: [birth year], 69 y.o.   MRN: [id number]  HPI  69 year old Female seen for Medicare wellness and health maintenance exam as well as evaluation of medical issues.   History of GE reflux.  Had fundoplication in the remote past.  History of hyperplastic colon polyps.  A colonoscopy in 2013 and repeat study recommended in 5 to 10 years.  Last upper endoscopy was in 2018.  Is followed by cardiologist for paroxysmal atrial fibrillation and is on chronic anticoagulation.  History of hyperlipidemia and obesity.  Had severe back pain in the spring 2021 and saw Dr. Trenton Gammon.  An MRI of the LS spine showing right L4-L5 lateral recess stenosis.  Had epidural steroid injections after a trial of oral steroids.  Had prolonged recovery but finally improved.  History of knee surgery 2004.  Surgery for epicondylitis of the elbow in 2002.  Bilateral tubal ligation 1986.  3 pregnancies and no miscarriages.  Social history: She does not smoke.  Social alcohol consumption consisting of wine.  3 adult children, 2 sons and a daughter.  She is divorced and resides alone.  Family history: Daughter with history of Crohn's disease.  Mother died of complications of COPD.  Father deceased with history of dementia, hypertension and stroke.  Sister with history of diabetes, hypertension and has had bariatric surgery.  Sister with history of uterine cancer.  1 brother with history of kidney stones, hypertension, gout and allergies.            Review of Systems last bone density was 2018 and T spine -2.0 but does not want to repeat  Patient has history of migraine headaches, history of anxiety and insomnia.     Objective:   Physical Exam Blood pressure 130/80 pulse 81 regular, pulse oximetry 97% weight 195 pounds height 5 feet 4 inches BMI 33.47  Skin warm and dry.  Nodes none.  TMs are clear.  Neck is supple without JVD thyromegaly or carotid bruits.   Chest clear to auscultation.  Breast without masses.  Cardiac exam regular rate and rhythm normal S1 and S2 without murmurs or gallops.  Extremities without edema.  Neuro intact without focal deficits.  Affect thought and judgment are normal.       Assessment & Plan:  Essential hypertension stable on current regimen of diltiazem 240 mg every 24 hours, metoprolol 25 mg daily  Dependent edema treated with furosemide 20 mg  Osteopenia-does not want to repeat bone density at this time. Lowest T score was -2.0 in 2018  History of migraine headaches   Anxiety/insomnia treated with Valium as needed  Hyperlipidemia stable - Triglycerides 159, total cholesterol 152 LDL cholesterol 85.  Encourage patient to take statin medication.  Impaired glucose tolerance treated with diet.  Hemoglobin A1c 5.7%  History of paroxysmal atrial fibrillation currently on Cardizem CD 240 mg daily and Eliquis 5 mg twice daily  Status post fundoplication several years ago.  Is on Protonix and Remeron per Dr. Carlean Purl.  History of severe GE reflux.  Low HDL of 41  Plan: Return in 1 year or as needed.  Have annual mammogram.  Has had 3 COVID-19 vaccines.  Tetanus immunization is up-to-date.  Should get flu vaccine.  Medicare wellness visit was done May 2021.  Visit was split with Medicare wellness visit being done in May and health maintenance exam being done in November because of [employer] coverage.

## 2020-09-01 ENCOUNTER — Other Ambulatory Visit: Payer: Self-pay | Admitting: Cardiology

## 2020-09-01 DIAGNOSIS — R609 Edema, unspecified: Secondary | ICD-10-CM

## 2020-09-17 ENCOUNTER — Other Ambulatory Visit: Payer: Self-pay | Admitting: Internal Medicine

## 2020-09-27 NOTE — Patient Instructions (Signed)
It was a pleasure to see you today.  Return in May 2022 for Medicare wellness visit.  Your physical exam will be scheduled this time next year.  No change in medications.  Work on diet and exercise.

## 2020-10-22 ENCOUNTER — Telehealth: Payer: Self-pay

## 2020-10-22 ENCOUNTER — Ambulatory Visit: Payer: Medicare HMO | Admitting: Gastroenterology

## 2020-10-22 ENCOUNTER — Encounter: Payer: Self-pay | Admitting: Gastroenterology

## 2020-10-22 ENCOUNTER — Encounter: Payer: Self-pay | Admitting: Internal Medicine

## 2020-10-22 VITALS — BP 130/82 | HR 86 | Ht 65.0 in | Wt 194.0 lb

## 2020-10-22 DIAGNOSIS — R1310 Dysphagia, unspecified: Secondary | ICD-10-CM | POA: Diagnosis not present

## 2020-10-22 DIAGNOSIS — Z7901 Long term (current) use of anticoagulants: Secondary | ICD-10-CM

## 2020-10-22 DIAGNOSIS — K219 Gastro-esophageal reflux disease without esophagitis: Secondary | ICD-10-CM | POA: Diagnosis not present

## 2020-10-22 MED ORDER — PANTOPRAZOLE SODIUM 40 MG PO TBEC: 40.0000 mg | DELAYED_RELEASE_TABLET | Freq: Two times a day (BID) | ORAL | 4 refills | Status: DC

## 2020-10-22 NOTE — Telephone Encounter (Signed)
[facility] Pre-operative Risk Assessment     Request for surgical clearance:     Endoscopy Procedure  What type of surgery is being performed?     Endoscopy   When is this surgery scheduled?     10/23/20  What type of clearance is required ?   Pharmacy  Are there any medications that need to be held prior to surgery and how long? 2 days  Practice name and name of physician performing surgery?      [facility]  What is your office phone and fax number?      Phone- [phone]  [phone]  Anesthesia type (None, local, MAC, general) ?       MAC

## 2020-10-22 NOTE — Patient Instructions (Addendum)
If you are age 70 or older, your body mass index should be between 23-30. Your Body mass index is 32.28 kg/m. If this is out of the aforementioned range listed, please consider follow up with your Primary Care Provider.  If you are age 62 or younger, your body mass index should be between 19-25. Your Body mass index is 32.28 kg/m. If this is out of the aformentioned range listed, please consider follow up with your Primary Care Provider.   You have been scheduled for an endoscopy . Please follow the written instructions given to you at your visit today. If you use inhalers (even only as needed), please bring them with you on the day of your procedure.  Thank you for choosing me and [facility].  Alonza Bogus, PA-C

## 2020-10-22 NOTE — Telephone Encounter (Signed)
Patient with diagnosis of afib on Eliquis for anticoagulation.    Procedure: endoscopy Date of procedure: 10/23/20  CHA2DS2-VASc Score = 4  This indicates a 4.8% annual risk of stroke. The patient's score is based upon: CHF History: Yes HTN History: Yes Diabetes History: No Stroke History: No Vascular Disease History: No Age Score: 1 Gender Score: 1  CrCl 23mL/min using adjusted body weight Platelet count 317K  Pt can hold Eliquis for 2 days prior to procedure, however we received this request the evening before her procedure so she is really only able to hold 1 evening dose before procedure if date is kept for tomorrow. If 2 day hold is needed by MD performing endoscopy, procedure date will need to be moved unless pt has already started holding her Eliquis.

## 2020-10-22 NOTE — Progress Notes (Addendum)
10/22/2020 Olivia Reynolds [id number] [birth year]   HISTORY OF PRESENT ILLNESS: This is a pleasant 70 year old female who is a patient of Dr. Celesta Aver.  She has history of hiatal hernia which was previously repaired many years ago and now has been recurrent at 5 cm on her last EGD.  She also has history of benign intrinsic stenosis of the esophagus its been dilated in the past.  Has acid reflux.  Is currently on pantoprazole 40 mg daily.  She has had good results from previous dilations the last being in November 2018.  She comes here today stating that over the past 1 to 2 months she has had a lot of dysphagia again.  Is having to both solids and liquids.  She is having to pure everything and is really not eating much at all because it is such a pain to have to pure everything and even having problems with liquids as well.  She is on Eliquis twice daily for atrial fibrillation as prescribed by Dr. Stanford Breed.   Past Medical History:  Diagnosis Date  . Chronic headaches   . Depression   . Diastolic dysfunction    a. 12/2013 Echo: EF 60-65%, Gr1 DD, Ao sclerosis w/o stenosis, triv MR, mod dil LA, mild TR.  . Diverticulosis   . GERD (gastroesophageal reflux disease)   . History of hiatal hernia    with repair  . History of stress test    a. 10/2008 MV: Ef 75%, nl perfusion.  Marland Kitchen HTN (hypertension)   . Hyperlipidemia   . Migraine   . Obesity   . Osteopenia   . PAF (paroxysmal atrial fibrillation) ([place])    a. Anticoagulated w/ Eliquis (CHA2DS2VASc = 3).   Past Surgical History:  Procedure Laterality Date  . CATARACT EXTRACTION, BILATERAL    . COLONOSCOPY    . ELBOW SURGERY  left   Tendon Surgery  . ESOPHAGEAL MANOMETRY N/A 02/23/2018   Procedure: ESOPHAGEAL MANOMETRY (EM);  Surgeon: Mauri Pole, MD;  Location: [facility];  Service: Endoscopy;  Laterality: N/A;  . EYE SURGERY Left    L-yey myopia  . HERNIA REPAIR  1751   nissan fundiplication  . KNEE ARTHROSCOPY  2005    R-knee  . LAPAROSCOPIC NISSEN FUNDOPLICATION    . ROTATOR CUFF REPAIR Right   . TONSILLECTOMY    . TUBAL LIGATION    . UPPER GASTROINTESTINAL ENDOSCOPY      reports that she quit smoking about 49 years ago. Her smoking use included cigarettes. She has never used smokeless tobacco. She reports previous alcohol use. She reports that she does not use drugs. family history includes Arthritis in her father; Asthma in her mother; COPD in her mother; Cancer in her father, sister, and son; Colon cancer in her cousin; Crohn's disease in her daughter; Diabetes in her sister; Hypertension in her brother, father, and sister; Pancreatic cancer in an other family member; Prostate cancer in her father; Stroke in her father. Allergies  Allergen Reactions  . Erythromycin Hives  . Statins     Lipitor  . Codeine Hives  . Hctz [Hydrochlorothiazide] Rash  . Lipitor [Atorvastatin] Other (See Comments)    Joint pain      Outpatient Encounter Medications as of 10/22/2020  Medication Sig  . acetaminophen (TYLENOL) 500 MG tablet Take 500 mg by mouth every 6 (six) hours as needed.  Marland Kitchen apixaban (ELIQUIS) 5 MG TABS tablet Take 1 tablet (5 mg total) by mouth 2 (two) times  daily.  . diazepam (VALIUM) 10 MG tablet TAKE 1 TABLET(10 MG) BY MOUTH EVERY 12 HOURS AS NEEDED FOR ANXIETY  . diltiazem (CARDIZEM CD) 240 MG 24 hr capsule Take 1 capsule (240 mg total) by mouth daily.  . furosemide (LASIX) 20 MG tablet TAKE 1 TABLET BY MOUTH EVERY DAY AS NEEDED FOR SWELLING  . metoprolol succinate (TOPROL-XL) 25 MG 24 hr tablet Take 1 tablet (25 mg total) by mouth daily. Take with or immediately following a meal.  . mirtazapine (REMERON) 15 MG tablet TAKE 1 TABLET(15 MG) BY MOUTH AT BEDTIME  . OVER THE COUNTER MEDICATION Tylenol Migraine , as needed  . pantoprazole (PROTONIX) 40 MG tablet TAKE 1 TABLET(40 MG) BY MOUTH DAILY BEFORE BREAKFAST  . rosuvastatin (CRESTOR) 20 MG tablet TAKE 1 TABLET BY MOUTH EVERY DAY   No  facility-administered encounter medications on file as of 10/22/2020.    REVIEW OF SYSTEMS  : All other systems reviewed and negative except where noted in the History of Present Illness.  PHYSICAL EXAM: BP 130/82   Pulse 86   Ht 5\' 5"  (1.651 m)   Wt 194 lb (88 kg)   BMI 32.28 kg/m  General: Well developed white female in no acute distress Head: Normocephalic and atraumatic Eyes:  Sclerae anicteric, conjunctiva pink. Ears: Normal auditory acuity Lungs: Clear throughout to auscultation; no W/R/R. Heart: Regular rate and rhythm; no M/R/G. Abdomen: Soft, non-distended.  BS present.  Mild epigastric TTP. Musculoskeletal: Symmetrical with no gross deformities  Skin: No lesions on visible extremities Extremities: No edema  Neurological: Alert oriented x 4, grossly non-focal Psychological:  Alert and cooperative. Normal mood and affect  ASSESSMENT AND PLAN: *70 year old female with history of GERD and dysphagia as well as a 5 cm hiatal hernia: Had benign intrinsic stenosis seen on last EGD in 2018 that was balloon dilated with relief of symptoms. Now again with recurrent dysphagia to both solids and liquids over the last 1 to 2 months. We will plan for repeat EGD with dilation with Dr. Carlean Purl. She is currently on pantoprazole 40 mg daily.  I am going to increase that to twice daily. New prescription sent to pharmacy. *Chronic anticoagulation with Eliquis due to paroxysmal atrial fibrillation:  Will hold Eliquis for 1-2 days prior to endoscopic procedures - will instruct when and how to resume after procedure. Benefits and risks of procedure explained including risks of bleeding, perforation, infection, missed lesions, reactions to medications and possible need for hospitalization and surgery for complications. Additional rare but real risk of stroke or other vascular clotting events off of Eliquis also explained and need to seek urgent help if any signs of these problems occur. Will communicate  by phone or EMR with patient's prescribing provider, Dr. Stanford Breed, to confirm that holding Eliquis is reasonable in this case.   **Since patient is scheduled for procedure tomorrow and has already held her Eliquis on her own for 24 hours Dr. Carlean Purl had conversation with her and explained the risks instead of trying to get clearance from Dr. Stanford Breed.   CC:  Baxley, Cresenciano Lick, MD    Agree with Ms. Alani Sabbagh's management.Janeal Holmes discussed with patient as well.  Gatha Mayer, MD, [facility]

## 2020-10-22 NOTE — Telephone Encounter (Signed)
   Primary Cardiologist: Kirk Ruths, MD  Chart reviewed as part of pre-operative protocol coverage.   Patient with diagnosis of afib on Eliquis for anticoagulation.    Procedure: endoscopy Date of procedure: 10/23/20  CHA2DS2-VASc Score = 4  This indicates a 4.8% annual risk of stroke. The patient's score is based upon: CHF History: Yes HTN History: Yes Diabetes History: No Stroke History: No Vascular Disease History: No Age Score: 1 Gender Score: 1  CrCl 36mL/min using adjusted body weight Platelet count 317K  Pt can hold Eliquis for 2 days prior to procedure, however we received this request the evening before her procedure so she is really only able to hold 1 evening dose before procedure if date is kept for tomorrow. If 2 day hold is needed by MD performing endoscopy, procedure date will need to be moved unless pt has already started holding her Eliquis.  I will route this recommendation to the requesting party via Epic fax function and remove from pre-op pool.  Please call with questions.  [name]. Jemimah Cressy NP-C    10/22/2020, 5:01 PM [facility] [place] [address] Office [phone] Fax [phone]

## 2020-10-23 ENCOUNTER — Encounter: Payer: Self-pay | Admitting: Internal Medicine

## 2020-10-23 ENCOUNTER — Other Ambulatory Visit: Payer: Self-pay

## 2020-10-23 ENCOUNTER — Ambulatory Visit (AMBULATORY_SURGERY_CENTER): Payer: Medicare HMO | Admitting: Internal Medicine

## 2020-10-23 VITALS — BP 138/74 | HR 80 | Temp 97.3°F | Resp 16 | Ht 65.0 in | Wt 194.0 lb

## 2020-10-23 DIAGNOSIS — K219 Gastro-esophageal reflux disease without esophagitis: Secondary | ICD-10-CM

## 2020-10-23 DIAGNOSIS — K222 Esophageal obstruction: Secondary | ICD-10-CM | POA: Diagnosis not present

## 2020-10-23 DIAGNOSIS — K449 Diaphragmatic hernia without obstruction or gangrene: Secondary | ICD-10-CM | POA: Diagnosis not present

## 2020-10-23 DIAGNOSIS — I10 Essential (primary) hypertension: Secondary | ICD-10-CM | POA: Diagnosis not present

## 2020-10-23 DIAGNOSIS — R1310 Dysphagia, unspecified: Secondary | ICD-10-CM

## 2020-10-23 MED ORDER — SODIUM CHLORIDE 0.9 % IV SOLN: 500.0000 mL | Freq: Once | INTRAVENOUS | Status: DC

## 2020-10-23 NOTE — Progress Notes (Signed)
Called to room to assist during endoscopic procedure.  Patient ID and intended procedure confirmed with present staff. Received instructions for my participation in the procedure from the performing physician.  

## 2020-10-23 NOTE — Progress Notes (Signed)
Vital signs checked by:CW ? ?The medical and surgical history was reviewed and verified with the patient. ? ?

## 2020-10-23 NOTE — Patient Instructions (Addendum)
I dilated the esophagus where it joins the stomach - let us see if that helps you.  Please update me in a week by My Chart to let me know how the swallowing is doing.  Resume Eliquis tomorrow.  I appreciate the opportunity to care for you. Gatha Mayer, MD, FACG YOU HAD AN ENDOSCOPIC PROCEDURE TODAY AT [facility]:   Refer to the procedure report that was given to you for any specific questions about what was found during the examination.  If the procedure report does not answer your questions, please call your gastroenterologist to clarify.  If you requested that your care partner not be given the details of your procedure findings, then the procedure report has been included in a sealed envelope for you to review at your convenience later.  YOU SHOULD EXPECT: Some feelings of bloating in the abdomen. Passage of more gas than usual.  Walking can help get rid of the air that was put into your GI tract during the procedure and reduce the bloating. If you had a lower endoscopy (such as a colonoscopy or flexible sigmoidoscopy) you may notice spotting of blood in your stool or on the toilet paper. If you underwent a bowel prep for your procedure, you may not have a normal bowel movement for a few days.  Please Note:  You might notice some irritation and congestion in your nose or some drainage.  This is from the oxygen used during your procedure.  There is no need for concern and it should clear up in a day or so.  SYMPTOMS TO REPORT IMMEDIATELY:    Following upper endoscopy (EGD)  Vomiting of blood or coffee ground material  New chest pain or pain under the shoulder blades  Painful or persistently difficult swallowing  New shortness of breath  Fever of 100F or higher  Black, tarry-looking stools  For urgent or emergent issues, a gastroenterologist can be reached at any hour by calling [phone]. Do not use MyChart messaging for urgent concerns.    DIET:  Follow a  Post-Dilation Diet (see handout given to you by your recovery nurse): Nothing by mouth until 12:30pm, Clear Liquids Only from 12:30pm to 1:30 pm. Then you may proceed to a Soft Diet for the rest of the day today starting at 1:30pm. You may resume your regular diet as tolerated tomorrow morning.  Drink plenty of fluids but you should avoid alcoholic beverages for 24 hours.  MEDICATIONS: Continue present medications. Resume Eliquid (apixaban) at prior dose tomorrow.  Please see handouts given to you by your recovery nurse.  PLEASE UPDATE DR. Carlean Purl IN 1 WEEK REGARDING RESULTS OF THE DILATION - MAY DO SO WITH A MYCHART MESSAGE.  ACTIVITY:  You should plan to take it easy for the rest of today and you should NOT DRIVE or use heavy machinery until tomorrow (because of the sedation medicines used during the test).    FOLLOW UP: Our staff will call the number listed on your records 48-72 hours following your procedure to check on you and address any questions or concerns that you may have regarding the information given to you following your procedure. If we do not reach you, we will leave a message.  We will attempt to reach you two times.  During this call, we will ask if you have developed any symptoms of COVID 19. If you develop any symptoms (ie: fever, flu-like symptoms, shortness of breath, cough etc.) before then, please call [phone].  If you test positive for Covid 19 in the 2 weeks post procedure, please call and report this information to [place].    If any biopsies were taken you will be contacted by phone or by letter within the next 1-3 weeks.  Please call us at [phone] if you have not heard about the biopsies in 3 weeks.   Thank you for allowing [place] to provide for your healthcare needs today.   SIGNATURES/CONFIDENTIALITY: You and/or your care partner have signed paperwork which will be entered into your electronic medical record.  These signatures attest to the fact that that the  information above on your After Visit Summary has been reviewed and is understood.  Full responsibility of the confidentiality of this discharge information lies with you and/or your care-partner.

## 2020-10-23 NOTE — Op Note (Signed)
[facility] Patient Name: Olivia Reynolds Procedure Date: 10/23/2020 11:02 AM MRN: [id number] Endoscopist: Gatha Mayer , MD Age: 70 Referring MD:  Date of Birth: 02-Feb-1951 Gender: Female Account #: [id number] Procedure:                Upper GI endoscopy Indications:              Dysphagia Medicines:                Propofol per Anesthesia, Monitored Anesthesia Care Procedure:                Pre-Anesthesia Assessment:                           - Prior to the procedure, a History and Physical                            was performed, and patient medications and                            allergies were reviewed. The patient's tolerance of                            previous anesthesia was also reviewed. The risks                            and benefits of the procedure and the sedation                            options and risks were discussed with the patient.                            All questions were answered, and informed consent                            was obtained. Prior Anticoagulants: The patient                            last took Eliquis (apixaban) 2 days prior to the                            procedure. ASA Grade Assessment: II - A patient                            with mild systemic disease. After reviewing the                            risks and benefits, the patient was deemed in                            satisfactory condition to undergo the procedure.                           After obtaining informed consent, the endoscope was  passed under direct vision. Throughout the                            procedure, the patient's blood pressure, pulse, and                            oxygen saturations were monitored continuously. The                            Endoscope was introduced through the mouth, and                            advanced to the second part of duodenum. The upper                            GI endoscopy was  accomplished without difficulty.                            The patient tolerated the procedure well. Scope In: Scope Out: Findings:                 One benign-appearing, intrinsic moderate stenosis                            was found at the gastroesophageal junction. The                            stenosis was traversed. A TTS dilator was passed                            through the scope. Dilation with an 18-19-20 mm                            balloon dilator was performed to 20 mm. The                            dilation site was examined and showed no change.                            Estimated blood loss: none.                           The examined esophagus was moderately tortuous.                           A 5 cm hiatal hernia was present.                           Evidence of a prior Nissen fundoplication was found                            in the gastric body. This was characterized by  moderate stenosis.                           The exam was otherwise without abnormality. Complications:            No immediate complications. Estimated Blood Loss:     Estimated blood loss: none. Impression:               - Benign-appearing esophageal stenosis. Dilated. ?                            if this, motility or hiatal hernia with stenosis                            causing problems - manometry in 2019 w/ NL                            esophageal peristalsis but impaired GE junction                            relaxation                           - Tortuous esophagus.                           - 5 cm hiatal hernia.                           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                           - The examination was otherwise normal.                           - No specimens collected. Recommendation:           - Patient has a contact number available for                             emergencies. The signs and symptoms of potential                            delayed complications were discussed with the                            patient. Return to normal activities tomorrow.                            Written discharge instructions were provided to the                            patient.                           -  Clear liquids x 1 hour then soft foods rest of                            day. Start prior diet tomorrow.                           - Continue present medications.                           - Resume Eliquis (apixaban) at prior dose tomorrow.                           - PATIENT TO UPDATE ME IN 1 WEEK REGARDING RESULTS                            OF DILATION - MY CHART MESSAGE PLEASE Gatha Mayer, MD 10/23/2020 11:29:53 AM This report has been signed electronically.

## 2020-10-23 NOTE — Progress Notes (Signed)
1108 Robinul 0.1 mg IV given due large amount of secretions upon assessment.  MD made aware, vss  

## 2020-10-23 NOTE — Progress Notes (Signed)
Report given to PACU, vss 

## 2020-10-25 ENCOUNTER — Other Ambulatory Visit: Payer: Self-pay | Admitting: Internal Medicine

## 2020-10-25 ENCOUNTER — Telehealth: Payer: Self-pay

## 2020-10-25 NOTE — Telephone Encounter (Signed)
  Follow up Call-  Call back number 10/23/2020 05/17/2019  Post procedure Call Back phone  # [phone]  Permission to leave phone message Yes Yes  Some recent data might be hidden     Patient questions:  Do you have a fever, pain , or abdominal swelling? No. Pain Score  0 *  Have you tolerated food without any problems? Yes.  pt reports she hasn't eaten as much as normal d/t sore throat but states "it's getting better each day".  Encouraged patient to call [place] if sore throat does not continue to progressively better.  Patient verbalized understanding and appreciated the follow up call.  Have you been able to return to your normal activities? Yes.    Do you have any questions about your discharge instructions: Diet   No. Medications  No. Follow up visit  No.  Do you have questions or concerns about your Care? No.  Actions: * If pain score is 4 or above: No action needed, pain <4.  1. Have you developed a fever since your procedure? no  2.   Have you had an respiratory symptoms (SOB or cough) since your procedure? no  3.   Have you tested positive for COVID 19 since your procedure no  4.   Have you had any family members/close contacts diagnosed with the COVID 19 since your procedure?  no   If yes to any of these questions please route to Joylene John, RN and Joella Prince, RN

## 2020-10-25 NOTE — Telephone Encounter (Signed)
  Follow up Call-  Call back number 10/23/2020 05/17/2019  Post procedure Call Back phone  # [phone]  Permission to leave phone message Yes Yes  Some recent data might be hidden     1st follow up call made.  NALM

## 2020-10-25 NOTE — Telephone Encounter (Signed)
Please advise Sir, thank you. 

## 2020-10-25 NOTE — Telephone Encounter (Signed)
Noted thank you

## 2020-11-06 DIAGNOSIS — H43812 Vitreous degeneration, left eye: Secondary | ICD-10-CM | POA: Diagnosis not present

## 2020-11-07 ENCOUNTER — Ambulatory Visit: Payer: Medicare HMO | Admitting: Internal Medicine

## 2020-11-12 ENCOUNTER — Telehealth: Payer: Self-pay

## 2020-11-12 DIAGNOSIS — R1310 Dysphagia, unspecified: Secondary | ICD-10-CM

## 2020-11-12 NOTE — Telephone Encounter (Signed)
-----   Message from Gatha Mayer, MD sent at 11/12/2020  8:12 AM EST ----- Regarding: Timed barium swallow [name] needs a timed barium swallow where they watch for the emptying of the esophagus. Dr. Silverio Decamp tells me you put this in the comments but it is best to speak to the radiologist doing the testing that day.  So please order one on her diagnosis is dysphagia and let me know when it is scheduled so I can contact the doctor that day.

## 2020-11-12 NOTE — Telephone Encounter (Signed)
Patient reports that she is feeling better, she no longer has fever. She has been with someone with the flu. She is assuming that her symptoms were from the flu and that "I panicked". She is feeling better and she wants to wait on the barium swallow for now.  She will call back or Mychart message if she wants to proceed. She is going to try and get a home COVID test as well.

## 2020-11-29 ENCOUNTER — Telehealth: Payer: Self-pay | Admitting: Internal Medicine

## 2020-11-29 ENCOUNTER — Encounter: Payer: Self-pay | Admitting: Internal Medicine

## 2020-11-29 NOTE — Telephone Encounter (Signed)
Spoke with patient by phone today. She saw Dr. Satira Sark and Dr. Valetta Close at [facility]. Had floaters and flashes of light that were new. Was also found to have Strabismus and was referred to to Dr. Everitt Amber, Pediatric Ophthalmologist who does strabismus surgery. She says Medicare is requiring referral from me as I am her primary care physician.I am OK with making that referral. MJB.MD

## 2020-12-01 ENCOUNTER — Other Ambulatory Visit: Payer: Self-pay | Admitting: Cardiology

## 2020-12-01 ENCOUNTER — Other Ambulatory Visit: Payer: Self-pay | Admitting: Internal Medicine

## 2020-12-04 DIAGNOSIS — Z0289 Encounter for other administrative examinations: Secondary | ICD-10-CM

## 2020-12-04 NOTE — Telephone Encounter (Signed)
Faxed referral to Dr Annamaria Boots on 11/29/2020

## 2020-12-04 NOTE — Telephone Encounter (Signed)
Received phone call on 12/02/2020 from [place] that we needed to go to [employer] site and do the referral so I tried to do that, can not find where she is talking about so I called [employer] and they told me that Dr Janee Morn office needs to call and get a waiver.

## 2020-12-04 NOTE — Telephone Encounter (Addendum)
Kelly's from Dr Janee Morn office called back and said we had to get the reference number for them. I got all of the procedure codes and ICD 10 codes along with Tax ID and NPI number and called [employer] back. [phone] fax [phone] Spoke with a representative gave them all the information Tax ID [id number] NPI [id number]  Di[place] Code H50-15 H50-112  Procedure Codes 16073 [id number]  All visits at 1 time  Good for 1 year  Reference # [id number]  After 25 minutes giving all information they said patient does not have out of network benefits and they need more information and wants me to fax medical information, so I faxed last CPE office notes along with notes from eye doctor. Dr Everitt Amber is out of network for [facility] plan, we are trying to get a waiver so patient can go there, he is closest to patient, without her having to travel a distant's, for this procedure.  Called and spoke with billing manager at Dr Sondra Come office to let her know that I had spoke with [facility] and that we are now waiting to see if they will approve since I have faxed them the medical information.

## 2021-01-09 DIAGNOSIS — H5034 Intermittent alternating exotropia: Secondary | ICD-10-CM | POA: Diagnosis not present

## 2021-01-17 ENCOUNTER — Other Ambulatory Visit: Payer: Self-pay | Admitting: Cardiology

## 2021-01-22 ENCOUNTER — Telehealth: Payer: Self-pay

## 2021-01-22 NOTE — Telephone Encounter (Signed)
   Name: Olivia Reynolds  DOB: October 23, 1950  MRN: [id number]   Primary Cardiologist: Kirk Ruths, MD  Chart reviewed as part of pre-operative protocol coverage. Patient was contacted 01/22/2021 in reference to pre-operative risk assessment for pending surgery as outlined below.  TEEGAN GUINTHER was last seen on 08/05/2020 by Dr. Stanford Breed.  Since that day, CLEA DUBACH has done well without chest pain. She does have slightly increased dyspnea when compared to last year, however heart rate has been very well controlled in the 70s according to the patient.  Therefore, based on ACC/AHA guidelines, the patient would be at acceptable risk for the planned procedure without further cardiovascular testing.   She may hold Eliquis for 3 days prior to the procedure and restart as soon as possible afterward at the surgeon's discretion.  The patient was advised that if she develops new symptoms prior to surgery to contact our office to arrange for a follow-up visit, and she verbalized understanding.  I will route this recommendation to the requesting party via Epic fax function and remove from pre-op pool. Please call with questions.  [place], [place] 01/22/2021, 7:14 PM

## 2021-01-22 NOTE — Telephone Encounter (Signed)
Patient with diagnosis of atrial fibrillation on Eliquis for anticoagulation.    Procedure: spinal injection Date of procedure: TBD   CHA2DS2-VASc Score = 4  This indicates a 4.8% annual risk of stroke. The patient's score is based upon: CHF History: Yes HTN History: Yes Diabetes History: No Stroke History: No Vascular Disease History: No Age Score: 1 Gender Score: 1    CrCl 105.4 Platelet count 317  Per office protocol, patient can hold Eliquis for 3 days prior to procedure.   Patient will not need bridging with Lovenox (enoxaparin) around procedure.

## 2021-01-22 NOTE — Telephone Encounter (Signed)
Clinical pharmacist to review Eliquis 

## 2021-01-22 NOTE — Telephone Encounter (Signed)
   [facility] Pre-operative Risk Assessment    Patient Name: Olivia Reynolds  DOB: July 13, 1951  MRN:   Request for surgical clearance:  1. What type of surgery is being performed Spinal injection  2. When is this surgery scheduled TBD  3. What type of clearance is required (medical clearance vs. Pharmacy clearance to hold med vs. Both )  Both  4. Are there any medications that need to be held prior to surgery and how long Eliquis  5. Practice name and name of physician performing surgery [facility]  Dr.Albert Bartko  6. What is the office phone number [phone]   7.   What is the office fax number [phone]  8.   Anesthesia type not listed   Kathyrn Lass 01/22/2021, 3:16 PM  _________________________________________________________________   (provider comments below)

## 2021-01-30 ENCOUNTER — Other Ambulatory Visit: Payer: Self-pay | Admitting: Internal Medicine

## 2021-01-30 ENCOUNTER — Other Ambulatory Visit: Payer: Self-pay | Admitting: Gastroenterology

## 2021-01-30 DIAGNOSIS — I480 Paroxysmal atrial fibrillation: Secondary | ICD-10-CM

## 2021-02-05 DIAGNOSIS — Z6832 Body mass index (BMI) 32.0-32.9, adult: Secondary | ICD-10-CM | POA: Diagnosis not present

## 2021-02-05 DIAGNOSIS — I10 Essential (primary) hypertension: Secondary | ICD-10-CM | POA: Diagnosis not present

## 2021-02-05 DIAGNOSIS — M5416 Radiculopathy, lumbar region: Secondary | ICD-10-CM | POA: Diagnosis not present

## 2021-02-26 ENCOUNTER — Other Ambulatory Visit: Payer: Self-pay | Admitting: Internal Medicine

## 2021-03-06 DIAGNOSIS — Z0100 Encounter for examination of eyes and vision without abnormal findings: Secondary | ICD-10-CM | POA: Diagnosis not present

## 2021-03-24 ENCOUNTER — Ambulatory Visit (INDEPENDENT_AMBULATORY_CARE_PROVIDER_SITE_OTHER): Payer: Medicare HMO | Admitting: Internal Medicine

## 2021-03-24 ENCOUNTER — Other Ambulatory Visit: Payer: Self-pay

## 2021-03-24 ENCOUNTER — Encounter: Payer: Self-pay | Admitting: Internal Medicine

## 2021-03-24 VITALS — BP 150/100 | HR 91 | Temp 98.6°F | Ht 65.0 in | Wt 194.0 lb

## 2021-03-24 DIAGNOSIS — F419 Anxiety disorder, unspecified: Secondary | ICD-10-CM

## 2021-03-24 MED ORDER — BUPROPION HCL ER (XL) 150 MG PO TB24: 150.0000 mg | ORAL_TABLET | Freq: Every day | ORAL | 0 refills | Status: DC

## 2021-03-24 NOTE — Progress Notes (Signed)
   Subjective:    Patient ID: Olivia Reynolds, female    DOB: 1950/12/08, 70 y.o.   MRN: [id number]  HPI 70 year old Female called this am indicating she was having some issues with motivation and some situational stress with family. Feels isolated. Would like to keep her grandchildren more often but usually has to go over to their home to see them. Has had some visual problems and does not like to drive at night.  She is a longstanding patient in this practice and is the former Development worker, international aid of the [employer]. She is divorced and resides alone. Seldom drinks alcohol.  History of occasional migraine headaches. Has  3 adult children  She has a hx of PAF and os on chronic anticoagulation. Followed by Dr. Stanford Breed.  Hx of lumbar radiculopathy seen by Dr. Trenton Gammon in 2021 and treated conservatively.  Hx of irritable bowel syndrome followed by Dr. Carlean Purl, hx HTN, hyperlipidemia, hx migraine H/As GERD and hx of hiatal hernia with repair by Dr. Kaylyn Lim a number of years ago (Nissen fundoplication)   Reportedly has been given Remeron she says for irritable bowel syndrome but  record in EPIC indicates she saw Dr. Raelyn Number once in Sept 2020.  Had esophageal mannometry 2019 showing no achalsia.  Review of Systems still has occasional migraine headache     Objective:   Physical Exam BP 150/100 pulse 91 T 98.6 degrees weight 194 pounds BMI 32.28  She seems a bit sad. Not tearful. Seems a bit lonely.      Assessment & Plan:  I think patient has depression and situational stress with family. Worried about health of a couple of her grandchildren. May be having some relationship issues with daughter.  I have placed her on Wellbutrin to see if it will help with energy and motivation but would like Psych consult. She remains on Remeron.  Have asked her to call [name]) for an appointment. CBC and C-met are essentially normal.

## 2021-03-25 ENCOUNTER — Telehealth: Payer: Self-pay | Admitting: Internal Medicine

## 2021-03-25 LAB — CBC WITH DIFFERENTIAL/PLATELET
Absolute Monocytes: 573 cells/uL (ref 200–950)
Basophils Absolute: 94 cells/uL (ref 0–200)
Basophils Relative: 1 %
Eosinophils Absolute: 291 cells/uL (ref 15–500)
Eosinophils Relative: 3.1 %
HCT: 41.6 % (ref 35.0–45.0)
Hemoglobin: 13.9 g/dL (ref 11.7–15.5)
Lymphs Abs: 2867 cells/uL (ref 850–3900)
MCH: 30.5 pg (ref 27.0–33.0)
MCHC: 33.4 g/dL (ref 32.0–36.0)
MCV: 91.2 fL (ref 80.0–100.0)
MPV: 9.9 fL (ref 7.5–12.5)
Monocytes Relative: 6.1 %
Neutro Abs: 5574 cells/uL (ref 1500–7800)
Neutrophils Relative %: 59.3 %
Platelets: 307 10*3/uL (ref 140–400)
RBC: 4.56 10*6/uL (ref 3.80–5.10)
RDW: 12.7 % (ref 11.0–15.0)
Total Lymphocyte: 30.5 %
WBC: 9.4 10*3/uL (ref 3.8–10.8)

## 2021-03-25 LAB — COMPLETE METABOLIC PANEL WITH GFR
AG Ratio: 1.6 (calc) (ref 1.0–2.5)
ALT: 27 U/L (ref 6–29)
AST: 23 U/L (ref 10–35)
Albumin: 4.3 g/dL (ref 3.6–5.1)
Alkaline phosphatase (APISO): 50 U/L (ref 37–153)
BUN: 13 mg/dL (ref 7–25)
CO2: 29 mmol/L (ref 20–32)
Calcium: 9.8 mg/dL (ref 8.6–10.4)
Chloride: 107 mmol/L (ref 98–110)
Creat: 0.71 mg/dL (ref 0.60–0.93)
GFR, Est African American: 100 mL/min/{1.73_m2} (ref >=60)
GFR, Est Non African American: 86 mL/min/{1.73_m2} (ref >=60)
Globulin: 2.7 g/dL (calc) (ref 1.9–3.7)
Glucose, Bld: 99 mg/dL (ref 65–99)
Potassium: 4.9 mmol/L (ref 3.5–5.3)
Sodium: 147 mmol/L — ABNORMAL HIGH (ref 135–146)
Total Bilirubin: 1 mg/dL (ref 0.2–1.2)
Total Protein: 7 g/dL (ref 6.1–8.1)

## 2021-03-25 LAB — TSH: TSH: 2.94 mIU/L (ref 0.40–4.50)

## 2021-03-25 LAB — T4, FREE: Free T4: 1.2 ng/dL (ref 0.8–1.8)

## 2021-03-25 NOTE — Patient Instructions (Signed)
Try Wellbutrin XL 150 mg daily. Please call [name] for appt. TSH and free T4 are normal.

## 2021-03-25 NOTE — Telephone Encounter (Signed)
Called to tell patient lab results are OK. Left voice mail message. MJB, MD

## 2021-03-25 NOTE — Telephone Encounter (Signed)
Olivia Reynolds called to get an appointment with [name], and was told she need a written referral from you, and next appointment was in August. I have everything ready for a little note.

## 2021-03-26 NOTE — Telephone Encounter (Signed)
Faxed referral, office notes, demographics, medication list,  H&P to [name] at [facility] [phone], phone [phone]

## 2021-04-07 DIAGNOSIS — M5416 Radiculopathy, lumbar region: Secondary | ICD-10-CM | POA: Diagnosis not present

## 2021-04-07 DIAGNOSIS — M461 Sacroiliitis, not elsewhere classified: Secondary | ICD-10-CM | POA: Diagnosis not present

## 2021-04-07 DIAGNOSIS — M48062 Spinal stenosis, lumbar region with neurogenic claudication: Secondary | ICD-10-CM | POA: Diagnosis not present

## 2021-04-17 ENCOUNTER — Ambulatory Visit (HOSPITAL_COMMUNITY): Payer: Medicare HMO

## 2021-04-17 ENCOUNTER — Other Ambulatory Visit (INDEPENDENT_AMBULATORY_CARE_PROVIDER_SITE_OTHER): Payer: Medicare HMO

## 2021-04-17 ENCOUNTER — Encounter: Payer: Self-pay | Admitting: Internal Medicine

## 2021-04-17 ENCOUNTER — Ambulatory Visit: Payer: Medicare HMO | Admitting: Internal Medicine

## 2021-04-17 VITALS — BP 112/80 | HR 84 | Ht 65.0 in | Wt 190.0 lb

## 2021-04-17 DIAGNOSIS — R1084 Generalized abdominal pain: Secondary | ICD-10-CM

## 2021-04-17 DIAGNOSIS — R10813 Right lower quadrant abdominal tenderness: Secondary | ICD-10-CM | POA: Diagnosis not present

## 2021-04-17 DIAGNOSIS — M5431 Sciatica, right side: Secondary | ICD-10-CM | POA: Diagnosis not present

## 2021-04-17 DIAGNOSIS — R10814 Left lower quadrant abdominal tenderness: Secondary | ICD-10-CM

## 2021-04-17 DIAGNOSIS — R1032 Left lower quadrant pain: Secondary | ICD-10-CM

## 2021-04-17 DIAGNOSIS — R1031 Right lower quadrant pain: Secondary | ICD-10-CM | POA: Diagnosis not present

## 2021-04-17 LAB — CBC WITH DIFFERENTIAL/PLATELET
Basophils Absolute: 0.1 10*3/uL (ref 0.0–0.1)
Basophils Relative: 1.1 % (ref 0.0–3.0)
Eosinophils Absolute: 0.3 10*3/uL (ref 0.0–0.7)
Eosinophils Relative: 3.8 % (ref 0.0–5.0)
HCT: 39.7 % (ref 36.0–46.0)
Hemoglobin: 13.3 g/dL (ref 12.0–15.0)
Lymphocytes Relative: 22.8 % (ref 12.0–46.0)
Lymphs Abs: 1.9 10*3/uL (ref 0.7–4.0)
MCHC: 33.5 g/dL (ref 30.0–36.0)
MCV: 89.4 fl (ref 78.0–100.0)
Monocytes Absolute: 0.6 10*3/uL (ref 0.1–1.0)
Monocytes Relative: 6.9 % (ref 3.0–12.0)
Neutro Abs: 5.5 10*3/uL (ref 1.4–7.7)
Neutrophils Relative %: 65.4 % (ref 43.0–77.0)
Platelets: 378 10*3/uL (ref 150.0–400.0)
RBC: 4.44 Mil/uL (ref 3.87–5.11)
RDW: 13.4 % (ref 11.5–15.5)
WBC: 8.4 10*3/uL (ref 4.0–10.5)

## 2021-04-17 LAB — BASIC METABOLIC PANEL
BUN: 17 mg/dL (ref 6–23)
CO2: 25 mEq/L (ref 19–32)
Calcium: 9.8 mg/dL (ref 8.4–10.5)
Chloride: 102 mEq/L (ref 96–112)
Creatinine, Ser: 0.92 mg/dL (ref 0.40–1.20)
GFR: 63.2 mL/min (ref >60.00)
Glucose, Bld: 95 mg/dL (ref 70–99)
Potassium: 3.7 mEq/L (ref 3.5–5.1)
Sodium: 140 mEq/L (ref 135–145)

## 2021-04-17 MED ORDER — AMOXICILLIN-POT CLAVULANATE 875-125 MG PO TABS: 1.0000 | ORAL_TABLET | Freq: Two times a day (BID) | ORAL | 0 refills | Status: DC

## 2021-04-17 MED ORDER — HYDROCODONE-ACETAMINOPHEN 5-325 MG PO TABS: 1.0000 | ORAL_TABLET | Freq: Four times a day (QID) | ORAL | 0 refills | Status: DC | PRN

## 2021-04-17 MED ORDER — ONDANSETRON HCL 4 MG PO TABS: 4.0000 mg | ORAL_TABLET | Freq: Three times a day (TID) | ORAL | 1 refills | Status: DC | PRN

## 2021-04-17 NOTE — Progress Notes (Signed)
Olivia Reynolds 70 y.o. 10/21/1950 [id number]  Assessment & Plan:   Encounter Diagnoses  Name Primary?   Abdominal pain, acute, bilateral lower quadrant Yes   Left lower quadrant abdominal tenderness without rebound tenderness    Right lower quadrant abdominal tenderness without rebound tenderness    Sciatica of right side    Generalized abdominal pain      Clinical scenario suggestive of possible diverticulitis.  She is quite tender.  Question how much her back issues are playing into this as well.  I think a CT is necessary and I am treating empirically for diverticulitis and doing labs.  She has never had a clinical diagnosis of diverticulitis nor has she had a CT scan of the abdomen and pelvis for many years.  Diverticulosis were seen on colonoscopy in the past couple of years.  Sigmoid.  Orders Placed This Encounter  Procedures   CT Abdomen Pelvis W Contrast   Basic metabolic panel   CBC with Differential/Platelet    Meds ordered this encounter  Medications   amoxicillin-clavulanate (AUGMENTIN) 875-125 MG tablet    Sig: Take 1 tablet by mouth 2 (two) times daily.    Dispense:  20 tablet    Refill:  0   ondansetron (ZOFRAN) 4 MG tablet    Sig: Take 1 tablet (4 mg total) by mouth every 8 (eight) hours as needed for nausea or vomiting.    Dispense:  30 tablet    Refill:  1   HYDROcodone-acetaminophen (NORCO/VICODIN) 5-325 MG tablet    Sig: Take 1 tablet by mouth every 6 (six) hours as needed for severe pain.    Dispense:  15 tablet    Refill:  0   Lab Results  Component Value Date   WBC 8.4 04/17/2021   HGB 13.3 04/17/2021   HCT 39.7 04/17/2021   MCV 89.4 04/17/2021   PLT 378.0 04/17/2021   Lab Results  Component Value Date   CREATININE 0.92 04/17/2021   BUN 17 04/17/2021   NA 140 04/17/2021   K 3.7 04/17/2021   CL 102 04/17/2021   CO2 25 04/17/2021   I did not address her antidepressant question.  I was preoccupied with the other issues.  Chart review  shows that this was started in addition to her Remeron which would not be inappropriate for significant depression.  The Remeron was started for nausea and vomiting problems and lack of appetite.  It has worked quite well.   I appreciate the opportunity to care for this patient. CC: Elby Showers, MD  Subjective:   Chief Complaint: Abdominal pain constipation then diarrhea  HPI Olivia Reynolds messaged me yesterday, as below.  I responded and indicated she needed to be seen in the office. Dr Carlean Purl I began having very painful constipation pain in my lower abdomen and back 5 days ago.  I used stool softeners with no relief. I was drinking plenty of water but still no relief. I used an enema and only got minimal relief and the lower abdomen and back were hurting so bad that it hurt when I walked. The next day I took 2 laxatives. Within 6 hours my bowels began to move explosively. The lower abdomen pain lessened but was still there. I did not eat anything but kept pushing water.  Yesterday I drank only chicken broth and water and was beginning to feel much better. Today I mistakenly ate solid food (egg & toast). Obviously not the correct thing to do because within  minutes I was back in the bathroom.  The lower abdomen is tender to touch.  I'm an intestinal mess. This is probably a series of incorrect responses on my behalf.  The IBS USUALLY is either IBS-D or IBS-C. This seemed like both.  She has not had any food today so is not having any diarrhea though the pain is still a problem.  Temperature maximum 98.9.    Right sciatica is also a major issue she is moving slowly and has a very stiff back.  She also has raised the question about whether she needs Wellbutrin and Remeron.  Wellbutrin was recently started by Dr. Renold Genta after complaints of situational stress with diagnosis of depression and was recommended to get counseling. Allergies  Allergen Reactions   Erythromycin Hives   Statins     Lipitor    Codeine Hives   Hctz [Hydrochlorothiazide] Rash   Lipitor [Atorvastatin] Other (See Comments)    Joint pain   Current Outpatient Medications  Medication Instructions   acetaminophen (TYLENOL) 500 mg, Oral, Every 6 hours PRN       buPROPion (WELLBUTRIN XL) 150 mg, Oral, Daily   diazepam (VALIUM) 10 MG tablet TAKE 1 TABLET(10 MG) BY MOUTH EVERY 12 HOURS AS NEEDED FOR ANXIETY   diltiazem (CARDIZEM CD) 240 MG 24 hr capsule TAKE 1 CAPSULE(240 MG) BY MOUTH DAILY   ELIQUIS 5 MG TABS tablet TAKE 1 TABLET(5 MG) BY MOUTH TWICE DAILY   furosemide (LASIX) 20 MG tablet TAKE 1 TABLET BY MOUTH EVERY DAY AS NEEDED FOR SWELLING       metoprolol succinate (TOPROL-XL) 25 MG 24 hr tablet TAKE 1 TABLET(25 MG) BY MOUTH DAILY WITH OR IMMEDIATELY FOLLOWING A MEAL   mirtazapine (REMERON) 15 MG tablet TAKE 1 TABLET(15 MG) BY MOUTH AT BEDTIME       OVER THE COUNTER MEDICATION Tylenol Migraine , as needed    pantoprazole (PROTONIX) 40 MG tablet TAKE 1 TABLET(40 MG) BY MOUTH TWICE DAILY   rosuvastatin (CRESTOR) 20 MG tablet TAKE 1 TABLET BY MOUTH EVERY DAY     Past Medical History:  Diagnosis Date   Chronic headaches    Depression    Diastolic dysfunction    a. 12/2013 Echo: EF 60-65%, Gr1 DD, Ao sclerosis w/o stenosis, triv MR, mod dil LA, mild TR.   Diverticulosis    GERD (gastroesophageal reflux disease)    History of hiatal hernia    with repair   History of stress test    a. 10/2008 MV: Ef 75%, nl perfusion.   HTN (hypertension)    Hyperlipidemia    Migraine    Obesity    Osteopenia    PAF (paroxysmal atrial fibrillation) ([place])    a. Anticoagulated w/ Eliquis (CHA2DS2VASc = 3).   Past Surgical History:  Procedure Laterality Date   CATARACT EXTRACTION, BILATERAL     COLONOSCOPY     ELBOW SURGERY  left   Tendon Surgery   ESOPHAGEAL MANOMETRY N/A 02/23/2018   Procedure: ESOPHAGEAL MANOMETRY (EM);  Surgeon: Mauri Pole, MD;  Location: [facility];  Service: Endoscopy;  Laterality: N/A;    EYE SURGERY Left    L-yey myopia   HERNIA REPAIR  6962   nissan fundiplication   KNEE ARTHROSCOPY  2005   R-knee   LAPAROSCOPIC NISSEN FUNDOPLICATION     ROTATOR CUFF REPAIR Right    TONSILLECTOMY     TUBAL LIGATION     UPPER GASTROINTESTINAL ENDOSCOPY     Social History  Social History Narrative   Divorced, 3 children lives alone   She is a retired Musician   Former smoker no alcohol no drug use   family history includes Arthritis in her father; Asthma in her mother; COPD in her mother; Cancer in her father, sister, and son; Colon cancer in her cousin; Crohn's disease in her daughter; Diabetes in her sister; Hypertension in her brother, father, and sister; Pancreatic cancer in an other family member; Prostate cancer in her father; Stroke in her father.   Review of Systems As per HPI  Objective:   Physical Exam BP 112/80   Pulse 84   Ht 5\' 5"  (1.651 m)   Wt 190 lb (86.2 kg)   BMI 31.62 kg/m  Elderly white woman looking ill.  She is moving slowly and obvious back pain. Mild back tenderness to palpation midline and mostly over the right CVA area. Abdomen is soft but fairly tender in the right and left lower quadrants without rebound.  Bowel sounds are present but not hyperactive. Gait is antalgic and slow. She is alert and oriented x3

## 2021-04-17 NOTE — Patient Instructions (Addendum)
It sounds like you have diverticulitis.  Stay on a liquid diet for now.  We will get a CT scan done.  Start the medications.  If you get worse and it is severe then go to the emergency department.  I appreciate the opportunity to care for you. Gatha Mayer, MD, [facility]

## 2021-04-18 ENCOUNTER — Telehealth: Payer: Self-pay | Admitting: Internal Medicine

## 2021-04-18 ENCOUNTER — Other Ambulatory Visit: Payer: Self-pay

## 2021-04-18 ENCOUNTER — Ambulatory Visit (HOSPITAL_COMMUNITY)
Admission: RE | Admit: 2021-04-18 | Discharge: 2021-04-18 | Disposition: A | Discharge status: Home / Self Care | Payer: Medicare HMO | Source: Ambulatory Visit | Attending: Internal Medicine | Admitting: Internal Medicine

## 2021-04-18 DIAGNOSIS — R1084 Generalized abdominal pain: Secondary | ICD-10-CM | POA: Diagnosis not present

## 2021-04-18 DIAGNOSIS — R109 Unspecified abdominal pain: Secondary | ICD-10-CM | POA: Diagnosis not present

## 2021-04-18 MED ORDER — IOHEXOL 9 MG/ML PO SOLN: 1000.0000 mL | ORAL | Status: AC

## 2021-04-18 MED ORDER — IOHEXOL 9 MG/ML PO SOLN
ORAL | Status: AC
  Filled 2021-04-18: qty 1000

## 2021-04-18 MED ORDER — IOHEXOL 350 MG/ML SOLN
100.0000 mL | Freq: Once | INTRAVENOUS | Status: AC | PRN
  Administered 2021-04-18: 80 mL via INTRAVENOUS

## 2021-04-18 NOTE — Addendum Note (Signed)
Addended byDebbe Mounts on: 04/18/2021 02:18 PM   Modules accepted: Orders

## 2021-04-18 NOTE — Telephone Encounter (Signed)
Auth obtained as of 1pm 04/18/21 as below:  [id number] good 05/19/21-05/18/21  Called [facility]. Advised CT is auth'd, provided with auth #. Advised pt is needing CT rescheduled STAT. Informed me that pt will need to come NOW for a work in, and will need to begin drinking contrast while on her way to [facility]. Further informed me that results will be called in STAT to Doc of the Day.   Called pt and made her aware of above. Apologized to pt for this issue, and reassured we are working diligently to get her an answer for her symptoms. Expressed appreciation and understanding. States she is leaving now to head to [facility]. Reassured she will get a call about her results once received. Pt also aware call will likely be from the Doc of the Day who will provide her with further instructions. Thanked me for all my help

## 2021-04-18 NOTE — Telephone Encounter (Signed)
See previous note from Ammie.

## 2021-04-18 NOTE — Telephone Encounter (Signed)
Inbound call from patient. CT at [facility] was cancelled because of pre authorization was not obtained beforehand. Patient had questions about if she should eat something. Have only had liquids for 2 days straight and wonder if she can eat. Was told to not eat till CT. Best contact number [phone]

## 2021-04-21 ENCOUNTER — Other Ambulatory Visit: Payer: Self-pay | Admitting: Internal Medicine

## 2021-04-22 NOTE — Telephone Encounter (Signed)
She said she is going to see Olivia Reynolds the first appt was 8/12 and this medication is helping her.

## 2021-04-30 ENCOUNTER — Other Ambulatory Visit: Payer: Self-pay | Admitting: Internal Medicine

## 2021-04-30 NOTE — Telephone Encounter (Signed)
Please advise Sir, thank you. 

## 2021-05-01 NOTE — Telephone Encounter (Signed)
Rx sent 

## 2021-05-09 DIAGNOSIS — F411 Generalized anxiety disorder: Secondary | ICD-10-CM | POA: Diagnosis not present

## 2021-05-09 DIAGNOSIS — F331 Major depressive disorder, recurrent, moderate: Secondary | ICD-10-CM | POA: Diagnosis not present

## 2021-05-22 ENCOUNTER — Ambulatory Visit: Payer: Medicare HMO | Admitting: Internal Medicine

## 2021-05-22 ENCOUNTER — Telehealth: Payer: Self-pay | Admitting: Internal Medicine

## 2021-05-22 NOTE — Telephone Encounter (Signed)
scheduled

## 2021-05-22 NOTE — Telephone Encounter (Signed)
Olivia Reynolds [phone]  Darya called to say she was up all night with a red crusty eye, she put warm compresses on it. Hurt during the night, the eye is pinkish and cloudy, she has also had a headache but thought it might be from change in diet, tylenol does help with that.

## 2021-05-28 DIAGNOSIS — F411 Generalized anxiety disorder: Secondary | ICD-10-CM | POA: Diagnosis not present

## 2021-05-28 DIAGNOSIS — F331 Major depressive disorder, recurrent, moderate: Secondary | ICD-10-CM | POA: Diagnosis not present

## 2021-06-09 ENCOUNTER — Ambulatory Visit: Payer: Medicare HMO | Admitting: Physician Assistant

## 2021-06-16 DIAGNOSIS — F331 Major depressive disorder, recurrent, moderate: Secondary | ICD-10-CM | POA: Diagnosis not present

## 2021-07-24 ENCOUNTER — Ambulatory Visit: Payer: Medicare HMO | Admitting: Physician Assistant

## 2021-07-24 NOTE — Progress Notes (Signed)
Cardiology Office Note   Date:  07/25/2021   ID:  LABRISHA WUELLNER, DOB 1950-11-15, MRN [id number]  PCP:  Elby Showers, MD  Cardiologist: Dr. Stanford Breed CC: F/U Atrial Fib    History of Present Illness: Olivia Reynolds is a 70 y.o. female who presents for ongoing assessment and management of atrial fibrillation, with most recent echocardiogram in April 2018 showing normal LV function with mild left ventricular hypertrophy and grade 1 diastolic dysfunction.  She also had a nuclear medicine stress test in 2019 which showed an EF of 63% and probable shifting breast attenuation.  She was last seen by Dr. Stanford Breed on 08/05/2020.  She was to continue on Cardizem and metoprolol along with apixaban for CVA prophylaxis. CHADS VASC Score of  4.  She has been seen in the past on 04/17/2021 by Dr. Carlean Purl, GI for complaints of abdominal pain, and was treated empirically for diverticulitis.  Descending thoracic aorta revealed atherosclerotic vascular calcifications.  She did have a moderate size hiatal hernia with evidence of prior fundoplication.  The films also confirmed sigmoid diverticulosis with mild acute diverticulitis.  She was also found to have 2 mm right kidney pole nonobstructive renal calculus.  She comes today with increasing complaints of palpitations over the last 6 months.  She is noticing it mostly in the morning but sometimes during the day.  She states that she sometimes has to take an additional 25 mg of metoprolol to help it to subside.  Sometimes she feels it pulsating up into her jaw.  This extra dose of metoprolol does calm the palpitations.  She has been medically compliant with Eliquis, diltiazem, and all other regimen.  She denies any associated chest pain, dizziness, shortness of breath, or near syncope.  She is recently began to see a psychiatrist Dr. Stevan Born for depression and some issues re -acclimating socially after the pandemic.  She has been placed on Wellbutrin with a beginning  dose of 150 mg which decreased after 1 month's time to 300 mg daily which has been started within the last 4 months.  She is also been given something to help her to sleep, ProSom.  She is also noted some tremors and shaking in her left arm, to the shoulder and into the hands which occur intermittently without any provocation.   Past Medical History:  Diagnosis Date   Chronic headaches    Depression    Diastolic dysfunction    a. 12/2013 Echo: EF 60-65%, Gr1 DD, Ao sclerosis w/o stenosis, triv MR, mod dil LA, mild TR.   Diverticulosis    GERD (gastroesophageal reflux disease)    History of hiatal hernia    with repair   History of stress test    a. 10/2008 MV: Ef 75%, nl perfusion.   HTN (hypertension)    Hyperlipidemia    Migraine    Obesity    Osteopenia    PAF (paroxysmal atrial fibrillation) ([place])    a. Anticoagulated w/ Eliquis (CHA2DS2VASc = 3).    Past Surgical History:  Procedure Laterality Date   CATARACT EXTRACTION, BILATERAL     COLONOSCOPY     ELBOW SURGERY  left   Tendon Surgery   ESOPHAGEAL MANOMETRY N/A 02/23/2018   Procedure: ESOPHAGEAL MANOMETRY (EM);  Surgeon: Mauri Pole, MD;  Location: [facility];  Service: Endoscopy;  Laterality: N/A;   EYE SURGERY Left    L-yey myopia   HERNIA REPAIR  3016   nissan fundiplication   KNEE ARTHROSCOPY  2005  R-knee   LAPAROSCOPIC NISSEN FUNDOPLICATION     ROTATOR CUFF REPAIR Right    TONSILLECTOMY     TUBAL LIGATION     UPPER GASTROINTESTINAL ENDOSCOPY       Current Outpatient Medications  Medication Sig Dispense Refill   acetaminophen (TYLENOL) 500 MG tablet Take 500 mg by mouth every 6 (six) hours as needed.     diltiazem (CARDIZEM CD) 240 MG 24 hr capsule TAKE 1 CAPSULE(240 MG) BY MOUTH DAILY 90 capsule 1   ELIQUIS 5 MG TABS tablet TAKE 1 TABLET(5 MG) BY MOUTH TWICE DAILY 180 tablet 1   estazolam (PROSOM) 2 MG tablet      furosemide (LASIX) 20 MG tablet TAKE 1 TABLET BY MOUTH EVERY DAY AS NEEDED FOR  SWELLING 90 tablet 3   metoprolol succinate (TOPROL-XL) 25 MG 24 hr tablet TAKE 1 TABLET(25 MG) BY MOUTH DAILY WITH OR IMMEDIATELY FOLLOWING A MEAL 90 tablet 3   mirtazapine (REMERON) 15 MG tablet TAKE 1 TABLET(15 MG) BY MOUTH AT BEDTIME 30 tablet 5   ondansetron (ZOFRAN) 4 MG tablet Take 1 tablet (4 mg total) by mouth every 8 (eight) hours as needed for nausea or vomiting. 30 tablet 1   OVER THE COUNTER MEDICATION Tylenol Migraine , as needed     pantoprazole (PROTONIX) 40 MG tablet TAKE 1 TABLET(40 MG) BY MOUTH TWICE DAILY 60 tablet 4   rosuvastatin (CRESTOR) 20 MG tablet TAKE 1 TABLET BY MOUTH EVERY DAY 90 tablet 1   No current facility-administered medications for this visit.    Allergies:   Erythromycin, Statins, Codeine, Hctz [hydrochlorothiazide], and Lipitor [atorvastatin]    Social History:  The patient  reports that she quit smoking about 49 years ago. Her smoking use included cigarettes. She has never used smokeless tobacco. She reports that she does not currently use alcohol. She reports that she does not use drugs.   Family History:  The patient's family history includes Arthritis in her father; Asthma in her mother; COPD in her mother; Cancer in her father, sister, and son; Colon cancer in her cousin; Crohn's disease in her daughter; Diabetes in her sister; Hypertension in her brother, father, and sister; Pancreatic cancer in an other family member; Prostate cancer in her father; Stroke in her father.    ROS: All other systems are reviewed and negative. Unless otherwise mentioned in H&P    PHYSICAL EXAM: VS:  BP 140/82   Pulse 82   Ht 5\' 5"  (1.651 m)   Wt 192 lb (87.1 kg)   SpO2 96%   BMI 31.95 kg/m  , BMI Body mass index is 31.95 kg/m. GEN: Well nourished, well developed, in no acute distress HEENT: normal Neck: no JVD, carotid bruits, or masses Cardiac: RRR; no murmurs, rubs, or gallops,no edema  Respiratory:  Clear to auscultation bilaterally, normal work of  breathing GI: soft, nontender, nondistended, + BS MS: no deformity or atrophy, tremor noted in the left arm spontaneously. Skin: warm and dry, no rash Neuro:  Strength and sensation are intact Psych: euthymic mood, full affect   EKG:  EKG is ordered today. The ekg ordered today demonstrates normal sinus rhythm heart rate 82 bpm (personally reviewed   Recent Labs: 03/24/2021: ALT 27; TSH 2.94 04/17/2021: BUN 17; Creatinine, Ser 0.92; Hemoglobin 13.3; Platelets 378.0; Potassium 3.7; Sodium 140    Lipid Panel    Component Value Date/Time   CHOL 152 08/06/2020 1142   CHOL 178 11/23/2017 1214   TRIG 159 (H) 08/06/2020 1142  HDL 41 (L) 08/06/2020 1142   HDL 37 (L) 11/23/2017 1214   CHOLHDL 3.7 08/06/2020 1142   VLDL 28 04/13/2016 1117   LDLCALC 85 08/06/2020 1142      Wt Readings from Last 3 Encounters:  07/25/21 192 lb (87.1 kg)  04/17/21 190 lb (86.2 kg)  03/24/21 194 lb (88 kg)      Other studies Reviewed: Echocardiogram Jan 22, 2018 Left ventricle: The cavity size was normal. Wall thickness was    increased in a pattern of mild LVH. Systolic function was normal.    The estimated ejection fraction was in the range of 60% to 65%.    Doppler parameters are consistent with abnormal left ventricular    relaxation (grade 1 diastolic dysfunction). The E/e&' ratio is    between 8-15, suggesting indeterminate LV filling pressure.  - Aortic valve: Trileaflet. Sclerosis without stenosis. There was    trivial regurgitation.  - Mitral valve: Mildly thickened leaflets . There was trivial    regurgitation.  - Left atrium: The atrium was normal in size.  - Tricuspid valve: There was mild regurgitation.  - Pulmonary arteries: PA peak pressure: 27 mm Hg (S).  - Inferior vena cava: The vessel was normal in size. The    respirophasic diameter changes were in the normal range (>= 50%),    consistent with normal central venous pressure.   NM Stress Test 12/02/2017  The left ventricular  ejection fraction is normal (55-65%). Nuclear stress EF: 63%. There was no ST segment deviation noted during stress. This is a low risk study.   1. EF 63%, normal wall motion.  2. Reversible small, mild mid anterior perfusion defect.  This may be shifting breast artifact given prominent breast shadow.    Low risk study.   ASSESSMENT AND PLAN:  1.  Paroxysmal atrial fibrillation: She has been having extra palpitations during the daytime, mostly in the morning but occasionally at other times.  She has had been taking an extra dose of metoprolol 25 mg at least once a week for the symptoms.  She feels the palpitations radiating up into her jaw on occasion with a throbbing feeling.  She has been placed on an SSRI within the last 4 months which may be contributing to the palpitations.  I will increase metoprolol 25 XL which she is taking at night to 37.5 mg at at bedtime to assist with a.m. palpitations.  I will check a BMET, TSH, and CBC.  After seeing the patient, she asked me to come back to answer more questions.  She is very concerned about the fact that she has PAF and wants to explore ablation.  I explained that she is not currently in atrial fib and it might be best to have her wear a cardiac monitor for better evaluation of frequency and morphology of her "palpitations" and PAF.  I spend more time explaining the cardiac monitor.  She agrees to wear it. It will be ordered for 2 weeks.   She also requests samples Eliquis due to cost.   2.  Depression: Recently been started on Wellbutrin 300 mg daily and also ProSom to help with insomnia associated.  She is being followed by psychiatry.  I will check a serotonin level as she is complaining of tremors in her left arm and hands on occasion.  3.  Hypercholesterolemia: She remains on statin therapy.  As it is late in the day I will not check fasting lipids and LFTs.  She will be seeing  her primary care next month which will allow her to have  fasting labs at that time.  4.  Hypertension: Blood pressure slightly elevated today.  We will see how she does with higher dose of metoprolol.  She will see Dr. Stanford Breed in 1 month.  Current medicines are reviewed at length with the patient today.  I have spent 45 minutes dedicated to the care of this patient on the date of this encounter to include pre-visit review of records, assessment, management and diagnostic testing,with shared decision making.  Labs/ tests ordered today include: Serotonin level, CBC, BMET, TSH  Phill Myron. West Pugh, ANP, AACC   07/25/2021 2:37 PM    [facility] [address] Office [phone] Fax [phone]  Notice: This dictation was prepared with Dragon dictation along with smaller phrase technology. Any transcriptional errors that result from this process are unintentional and may not be corrected upon review.

## 2021-07-25 ENCOUNTER — Ambulatory Visit: Payer: Medicare HMO | Admitting: Adult Health

## 2021-07-25 ENCOUNTER — Other Ambulatory Visit: Payer: Self-pay

## 2021-07-25 ENCOUNTER — Ambulatory Visit (INDEPENDENT_AMBULATORY_CARE_PROVIDER_SITE_OTHER): Payer: Medicare HMO

## 2021-07-25 ENCOUNTER — Other Ambulatory Visit: Payer: Self-pay | Admitting: Adult Health

## 2021-07-25 ENCOUNTER — Encounter: Payer: Self-pay | Admitting: Adult Health

## 2021-07-25 VITALS — BP 140/82 | HR 82 | Ht 65.0 in | Wt 192.0 lb

## 2021-07-25 DIAGNOSIS — F32A Depression, unspecified: Secondary | ICD-10-CM

## 2021-07-25 DIAGNOSIS — E7800 Pure hypercholesterolemia, unspecified: Secondary | ICD-10-CM | POA: Diagnosis not present

## 2021-07-25 DIAGNOSIS — I480 Paroxysmal atrial fibrillation: Secondary | ICD-10-CM | POA: Diagnosis not present

## 2021-07-25 DIAGNOSIS — I10 Essential (primary) hypertension: Secondary | ICD-10-CM

## 2021-07-25 MED ORDER — METOPROLOL SUCCINATE ER 25 MG PO TB24: 37.5000 mg | ORAL_TABLET | Freq: Every day | ORAL | 1 refills | Status: DC

## 2021-07-25 MED ORDER — APIXABAN 5 MG PO TABS
5.0000 mg | ORAL_TABLET | Freq: Two times a day (BID) | ORAL | 0 refills | Status: DC
Start: 2021-07-25 — End: 2022-03-20

## 2021-07-25 NOTE — Patient Instructions (Addendum)
Medication Instructions:  Increase Metoprolol from 25 mg to 37.5 mg ( Take 1 Tablet Daily) *If you need a refill on your cardiac medications before your next appointment, please call your pharmacy*   Lab Work: BMET, CBC,Serotonin, TSH If you have labs (blood work) drawn today and your tests are completely normal, you will receive your results only by: [place] (if you have MyChart) OR A paper copy in the mail If you have any lab test that is abnormal or we need to change your treatment, we will call you to review the results.   Testing/Procedures: ZIO AT Long term monitor-Live Telemetry  Your physician has requested you wear a ZIO patch monitor for 14 days.  This is a single patch monitor. [employer] supplies one patch monitor per enrollment. Additional  stickers are not available.  Please do not apply patch if you will be having a Nuclear Stress Test, Echocardiogram, Cardiac CT, MRI,  or Chest Xray during the period you would be wearing the monitor. The patch cannot be worn during  these tests. You cannot remove and re-apply the ZIO AT patch monitor.  Your ZIO patch monitor will be mailed 3 day USPS to your address on file. It may take 3-5 days to  receive your monitor after you have been enrolled.  Once you have received your monitor, please review the enclosed instructions. Your monitor has  already been registered assigning a specific monitor serial # to you.   Billing and Patient Assistance Program information  Olivia Reynolds has been supplied with any insurance information on record for billing. [employer] offers a sliding scale Patient Assistance Program for patients without insurance, or whose  insurance does not completely cover the cost of the ZIO patch monitor. You must apply for the  Patient Assistance Program to qualify for the discounted rate. To apply, call [employer] at [phone],  select option 4, select option 2 , ask to apply for the Patient Assistance Program, (you can  request an  interpreter if needed). [employer] will ask your household income and how many people are in your  household. [employer] will quote your out-of-pocket cost based on this information. They will also be able  to set up a 12 month interest free payment plan if needed.  Applying the monitor   Shave hair from upper left chest.  Hold the abrader disc by orange tab. Rub the abrader in 40 strokes over left upper chest as indicated in  your monitor instructions.  Clean area with 4 enclosed alcohol pads. Use all pads to ensure the area is cleaned thoroughly. Let  dry.  Apply patch as indicated in monitor instructions. Patch will be placed under collarbone on left side of  chest with arrow pointing upward.  Rub patch adhesive wings for 2 minutes. Remove the white label marked "1". Remove the white label  marked "2". Rub patch adhesive wings for 2 additional minutes.  While looking in a mirror, press and release button in center of patch. A small green light will flash 3-4  times. This will be your only indicator that the monitor has been turned on.  Do not shower for the first 24 hours. You may shower after the first 24 hours.  Press the button if you feel a symptom. You will hear a small click. Record Date, Time and Symptom in  the Patient Log.   Starting the Gateway  In your kit there is a Hydrographic surveyor box the size of a cellphone. This is Airline pilot.  It transmits all your  recorded data to [facility]. This box must always stay within 10 feet of you. Open the box and push the *  button. There will be a light that blinks orange and then green a few times. When the light stops  blinking, the Gateway is connected to the ZIO patch. Call [employer] at [phone] to confirm your monitor is transmitting.  Returning your monitor  Remove your patch and place it inside the Gates. In the lower half of the Gateway there is a white  bag with prepaid postage on it. Place Gateway in bag and seal. Mail  package back to [place] as soon as  possible. Your physician should have your final report approximately 7 days after you have mailed back  your monitor. Call [facility] at [phone] if you have questions regarding your ZIO AT  patch monitor. Call them immediately if you see an orange light blinking on your monitor.  If your monitor falls off in less than 4 days, contact our Monitor department at [phone]. If your  monitor becomes loose or falls off after 4 days call [employer] at [phone] for suggestions on  securing your monitor    Follow-Up: At [facility], you and your health needs are our priority.  As part of our continuing mission to provide you with exceptional heart care, we have created designated Provider Care Teams.  These Care Teams include your primary Cardiologist (physician) and Advanced Practice Providers (APPs -  Physician Assistants and Nurse Practitioners) who all work together to provide you with the care you need, when you need it.  We recommend signing up for the patient portal called "MyChart".  Sign up information is provided on this After Visit Summary.  MyChart is used to connect with patients for Virtual Visits (Telemedicine).  Patients are able to view lab/test results, encounter notes, upcoming appointments, etc.  Non-urgent messages can be sent to your provider as well.   To learn more about what you can do with MyChart, go to [email or web address].    Your next appointment:   1 month(s)  The format for your next appointment:   In Person  Provider:   Kirk Ruths, MD

## 2021-07-25 NOTE — Progress Notes (Unsigned)
Enrolled patient for a 14 day Zio AT monitor to be mailed to patients home.  

## 2021-07-29 ENCOUNTER — Other Ambulatory Visit: Payer: Self-pay | Admitting: Internal Medicine

## 2021-07-29 ENCOUNTER — Other Ambulatory Visit: Payer: Self-pay | Admitting: Cardiology

## 2021-07-29 DIAGNOSIS — I480 Paroxysmal atrial fibrillation: Secondary | ICD-10-CM

## 2021-07-29 NOTE — Telephone Encounter (Signed)
Prescription refill request for Eliquis received. Indication:afib Last office visit:[place] 07/25/21 Scr:0.95 07/25/21 Age: 21f Weight:87.1kg

## 2021-07-30 DIAGNOSIS — F32A Depression, unspecified: Secondary | ICD-10-CM | POA: Diagnosis not present

## 2021-07-30 DIAGNOSIS — E7800 Pure hypercholesterolemia, unspecified: Secondary | ICD-10-CM

## 2021-07-30 DIAGNOSIS — I10 Essential (primary) hypertension: Secondary | ICD-10-CM

## 2021-07-30 DIAGNOSIS — I480 Paroxysmal atrial fibrillation: Secondary | ICD-10-CM | POA: Diagnosis not present

## 2021-07-31 DIAGNOSIS — I480 Paroxysmal atrial fibrillation: Secondary | ICD-10-CM | POA: Diagnosis not present

## 2021-07-31 DIAGNOSIS — I10 Essential (primary) hypertension: Secondary | ICD-10-CM | POA: Diagnosis not present

## 2021-07-31 LAB — CBC
Hematocrit: 42.5 % (ref 34.0–46.6)
Hemoglobin: 14.2 g/dL (ref 11.1–15.9)
MCH: 30.1 pg (ref 26.6–33.0)
MCHC: 33.4 g/dL (ref 31.5–35.7)
MCV: 90 fL (ref 79–97)
Platelets: 348 10*3/uL (ref 150–450)
RBC: 4.71 x10E6/uL (ref 3.77–5.28)
RDW: 11.9 % (ref 11.7–15.4)
WBC: 8.9 10*3/uL (ref 3.4–10.8)

## 2021-07-31 LAB — BASIC METABOLIC PANEL
BUN/Creatinine Ratio: 17 (ref 12–28)
BUN: 16 mg/dL (ref 8–27)
CO2: 22 mmol/L (ref 20–29)
Calcium: 10.1 mg/dL (ref 8.7–10.3)
Chloride: 102 mmol/L (ref 96–106)
Creatinine, Ser: 0.95 mg/dL (ref 0.57–1.00)
Glucose: 108 mg/dL — ABNORMAL HIGH (ref 70–99)
Potassium: 4.6 mmol/L (ref 3.5–5.2)
Sodium: 143 mmol/L (ref 134–144)
eGFR: 64 mL/min/{1.73_m2} (ref >59)

## 2021-07-31 LAB — TSH: TSH: 1.5 u[IU]/mL (ref 0.450–4.500)

## 2021-07-31 LAB — SEROTONIN SERUM: Serotonin, Serum: 109 ng/mL (ref 8–217)

## 2021-08-08 ENCOUNTER — Other Ambulatory Visit: Payer: Self-pay

## 2021-08-08 ENCOUNTER — Other Ambulatory Visit: Payer: Medicare HMO | Admitting: Internal Medicine

## 2021-08-08 DIAGNOSIS — I10 Essential (primary) hypertension: Secondary | ICD-10-CM

## 2021-08-08 DIAGNOSIS — E785 Hyperlipidemia, unspecified: Secondary | ICD-10-CM

## 2021-08-08 DIAGNOSIS — Z0000 Encounter for general adult medical examination without abnormal findings: Secondary | ICD-10-CM

## 2021-08-08 DIAGNOSIS — Z1329 Encounter for screening for other suspected endocrine disorder: Secondary | ICD-10-CM

## 2021-08-08 NOTE — Addendum Note (Signed)
Addended by: Angus Seller on: 08/08/2021 11:55 AM   Modules accepted: Orders

## 2021-08-09 LAB — COMPLETE METABOLIC PANEL WITH GFR
AG Ratio: 2 (calc) (ref 1.0–2.5)
ALT: 22 U/L (ref 6–29)
AST: 23 U/L (ref 10–35)
Albumin: 4.2 g/dL (ref 3.6–5.1)
Alkaline phosphatase (APISO): 48 U/L (ref 37–153)
BUN: 13 mg/dL (ref 7–25)
CO2: 27 mmol/L (ref 20–32)
Calcium: 9.4 mg/dL (ref 8.6–10.4)
Chloride: 106 mmol/L (ref 98–110)
Creat: 0.77 mg/dL (ref 0.60–1.00)
Globulin: 2.1 g/dL (calc) (ref 1.9–3.7)
Glucose, Bld: 119 mg/dL — ABNORMAL HIGH (ref 65–99)
Potassium: 4 mmol/L (ref 3.5–5.3)
Sodium: 143 mmol/L (ref 135–146)
Total Bilirubin: 0.7 mg/dL (ref 0.2–1.2)
Total Protein: 6.3 g/dL (ref 6.1–8.1)
eGFR: 83 mL/min/{1.73_m2} (ref >=60)

## 2021-08-09 LAB — LIPID PANEL
Cholesterol: 147 mg/dL (ref <200)
HDL: 46 mg/dL — ABNORMAL LOW (ref >=50)
LDL Cholesterol (Calc): 78 mg/dL (calc)
Non-HDL Cholesterol (Calc): 101 mg/dL (calc) (ref <130)
Total CHOL/HDL Ratio: 3.2 (calc) (ref <5.0)
Triglycerides: 126 mg/dL (ref <150)

## 2021-08-11 ENCOUNTER — Ambulatory Visit (INDEPENDENT_AMBULATORY_CARE_PROVIDER_SITE_OTHER): Payer: Medicare HMO | Admitting: Internal Medicine

## 2021-08-11 ENCOUNTER — Other Ambulatory Visit: Payer: Self-pay

## 2021-08-11 ENCOUNTER — Encounter: Payer: Self-pay | Admitting: Internal Medicine

## 2021-08-11 VITALS — BP 118/78 | HR 79 | Temp 99.3°F | Ht 63.75 in | Wt 193.0 lb

## 2021-08-11 DIAGNOSIS — E785 Hyperlipidemia, unspecified: Secondary | ICD-10-CM

## 2021-08-11 DIAGNOSIS — I480 Paroxysmal atrial fibrillation: Secondary | ICD-10-CM | POA: Diagnosis not present

## 2021-08-11 DIAGNOSIS — Z7901 Long term (current) use of anticoagulants: Secondary | ICD-10-CM

## 2021-08-11 DIAGNOSIS — Z0000 Encounter for general adult medical examination without abnormal findings: Secondary | ICD-10-CM

## 2021-08-11 DIAGNOSIS — Z8669 Personal history of other diseases of the nervous system and sense organs: Secondary | ICD-10-CM

## 2021-08-11 DIAGNOSIS — Z23 Encounter for immunization: Secondary | ICD-10-CM

## 2021-08-11 DIAGNOSIS — K219 Gastro-esophageal reflux disease without esophagitis: Secondary | ICD-10-CM

## 2021-08-11 DIAGNOSIS — F5102 Adjustment insomnia: Secondary | ICD-10-CM

## 2021-08-11 DIAGNOSIS — K449 Diaphragmatic hernia without obstruction or gangrene: Secondary | ICD-10-CM

## 2021-08-11 DIAGNOSIS — F439 Reaction to severe stress, unspecified: Secondary | ICD-10-CM | POA: Diagnosis not present

## 2021-08-11 DIAGNOSIS — I10 Essential (primary) hypertension: Secondary | ICD-10-CM | POA: Diagnosis not present

## 2021-08-11 DIAGNOSIS — M5416 Radiculopathy, lumbar region: Secondary | ICD-10-CM

## 2021-08-11 NOTE — Progress Notes (Signed)
IElby Showers, MD, have reviewed all documentation for this visit. The documentation on 08/13/21 for the exam, diagnosis, procedures, and orders are all accurate and complete.     Annual Wellness Visit     Patient: Olivia Reynolds, Female    DOB: [birth year], 70 y.o.   MRN: [id number] Visit Date: 08/11/2021  Chief Complaint  Patient presents with   Medicare Wellness   Subjective    Olivia Reynolds is a 70 y.o. female who presents today for her Annual Wellness Visit.  HPI Patient also presents today for health maintenance exam and evaluation of medical issues.  She has a longstanding history of GE reflux and underwent fundoplication in the remote past.  History of hyperplastic colon polyps.  Colonoscopy was done in 2013 with repeat study recommended in 5 to 10 years.  Last upper endoscopy was 2018.  Is followed by cardiologist for paroxysmal atrial fibrillation and is on chronic anticoagulation.  History of hyperlipidemia and obesity.  History of severe back pain in the spring 2021.  She saw Dr. Trenton Gammon.  An MRI of the LS spine showed right L4-L5 lateral recess stenosis.  She had epidural steroid injections after a trial of oral steroids.  Had prolonged recovery but finally improved.  History of knee surgery in 2004.  Surgery for epicondylitis of elbow in 2002.  Bilateral tubal ligation 1986.  3 pregnancies and no miscarriages.  Had epidural steroid injection L5-S1 in May.  Has seen Dr. Brien Few in July of this year.  The pain she was having at that time was over SI joint and had onset after attending a graduation.  Had onset of acute pain when standing up during the graduation.  Social history: Does not smoke.  Social alcohol consumption consisting of wine but not often.  3 adult children, 2 sons and a daughter.  She is divorced and resides alone.  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  Family history: Daughter with history of Crohn's  disease.  Mother died of complications of COPD.  Father deceased with history of dementia, hypertension and stroke.  Sister with history of diabetes, hypertension and has had bariatric surgery.  Sister with history of uterine cancer.  1 brother with history of kidney stones, hypertension, gout and allergies.  Had bone density study in 2018 with T score -2.0.    She has a history of anxiety and depression related to divorce and situational stress.  She is currently seeing Olivia Reynolds for counseling and medication consultation.  Seems to be doing better.  Has been prescribed Wellbutrin XL 300 mg daily and Lunesta for insomnia.  Was staying up late unable to sleep.    Patient Care Team: Elby Showers, MD as PCP - General (Internal Medicine) Stanford Breed Denice Bors, MD as PCP - Cardiology (Cardiology) Stanford Breed Denice Bors, MD as Consulting Physician (Cardiology)  Review of Systems-intermittent musculoskeletal pain.  Anxiety and depression symptoms have improved with treatment Olivia Reynolds.   Objective    Vitals: Blood pressure 118/78 pulse 79 temperature 99.3 degrees pulse oximetry 96% weight 193 pounds height 5 feet 3.75 inches BMI 33.39  Physical Exam  Skin is warm and dry.  No cervical adenopathy.  No thyromegaly.  No carotid bruits.  Chest is clear to auscultation without rales or wheezing.  Breast are without masses.  Cardiac exam: Regular rate and rhythm without ectopy.  Abdomen is soft nondistended without hepatosplenomegaly masses or tenderness.  No lower extremity pitting  edema.  Affect thought and judgment appear to be normal.   Most recent functional status assessment: In your present state of health, do you have any difficulty performing the following activities: 08/11/2021  Hearing? N  Vision? N  Difficulty concentrating or making decisions? N  Walking or climbing stairs? N  Dressing or bathing? N  Doing errands, shopping? N  Preparing Food and eating ? -  Using the Toilet? -  In  the past six months, have you accidently leaked urine? -  Do you have problems with loss of bowel control? -  Managing your Medications? -  Managing your Finances? -  Housekeeping or managing your Housekeeping? -  Some recent data might be hidden   Most recent fall risk assessment: Fall Risk  08/11/2021  Falls in the past year? 0  Comment -  Number falls in past yr: 0  Injury with Fall? -  Risk for fall due to : No Fall Risks  Follow up Falls evaluation completed    Most recent depression screenings: PHQ 2/9 Scores 08/11/2021 08/08/2020  PHQ - 2 Score 0 0   Most recent cognitive screening: 6CIT Screen 08/11/2021  What Year? 0 points  What month? 0 points  What time? 0 points  Count back from 20 0 points  Months in reverse 0 points  Repeat phrase 0 points  Total Score 0       Assessment & Plan     Annual wellness visit done today including the all of the following: Reviewed patient's Family Medical History Reviewed and updated list of patient's medical providers Assessment of cognitive impairment was done Assessed patient's functional ability Established a written schedule for health screening [place] Completed and Reviewed  Discussed health benefits of physical activity, and encouraged her to engage in regular exercise appropriate for her age and condition.    Impression:  History of lumbar radiculopathy and SI joint pain currently stable.  Recently saw Dr. Brien Few with SI joint pain.  No recurrence of lumbar radiculopathy recently.  Essential hypertension stable on current regimen  History of dependent edema but currently this is not an issue and she is not on Lasix.  Osteopenia-does not want to have repeat bone density study.  Lowest T score was -2.0 in 2018  History of migraine headaches related to stress and sleep deprivation  Anxiety/insomnia treated by Olivia Reynolds at [facility]  History of impaired glucose tolerance.  This  is treated with diet and recent serum glucose was 119..  To have hemoglobin A1c in December.  This was overlooked with these labs drawn for this visit.  Hyperlipidemia treated with Crestor 20 mg daily.  History of paroxysmal atrial fibrillation currently on Eliquis and Cardizem CD  History of fundoplication for severe GE reflux.  Treated with Protonix.  Also takes Remeron per Dr. Carlean Purl for this issue.  Plan: She will continue current medications and return in 1 year or as needed.  Needs to watch diet and try to get more exercise.  Walking would help if she can tolerate it.  Flu vaccine given.     {I, Elby Showers, MD, have reviewed all documentation for this visit. The documentation on 08/13/21 for the exam, diagnosis, procedures, and orders are all accurate and complete.    Angus Seller, CMA

## 2021-08-11 NOTE — Patient Instructions (Addendum)
RTC in December for Hgb AIC.  Flu vaccine given.  Try to walk some, watch diet, continue current medications.

## 2021-08-12 ENCOUNTER — Other Ambulatory Visit: Payer: Self-pay

## 2021-08-16 ENCOUNTER — Other Ambulatory Visit: Payer: Self-pay | Admitting: Cardiology

## 2021-08-18 DIAGNOSIS — H50331 Intermittent monocular exotropia, right eye: Secondary | ICD-10-CM | POA: Diagnosis not present

## 2021-08-18 DIAGNOSIS — H53041 Amblyopia suspect, right eye: Secondary | ICD-10-CM | POA: Diagnosis not present

## 2021-08-19 ENCOUNTER — Other Ambulatory Visit: Payer: Self-pay

## 2021-08-19 ENCOUNTER — Other Ambulatory Visit: Payer: Medicare HMO | Admitting: Internal Medicine

## 2021-08-19 ENCOUNTER — Telehealth: Payer: Self-pay | Admitting: Cardiology

## 2021-08-19 DIAGNOSIS — R7301 Impaired fasting glucose: Secondary | ICD-10-CM | POA: Diagnosis not present

## 2021-08-19 NOTE — Telephone Encounter (Signed)
Patient called to say that her insurance is saying that her heart monitor is not cover under her insurance. She states that she can afford to pay $500. She states she need our office to say how it is a necessary or her to have the monitor.Olivia Reynolds

## 2021-08-20 LAB — HEMOGLOBIN A1C
Hgb A1c MFr Bld: 5.8 % of total Hgb — ABNORMAL HIGH (ref <5.7)
Mean Plasma Glucose: 120 mg/dL
eAG (mmol/L): 6.6 mmol/L

## 2021-08-21 ENCOUNTER — Encounter: Payer: Self-pay | Admitting: Cardiology

## 2021-08-28 DIAGNOSIS — F411 Generalized anxiety disorder: Secondary | ICD-10-CM | POA: Diagnosis not present

## 2021-08-28 DIAGNOSIS — F3342 Major depressive disorder, recurrent, in full remission: Secondary | ICD-10-CM | POA: Diagnosis not present

## 2021-09-03 ENCOUNTER — Ambulatory Visit: Payer: Medicare HMO | Admitting: Cardiology

## 2021-09-09 NOTE — Telephone Encounter (Signed)
[employer] usually resubmits a claim with requested records if initial claim denied.  I have given some time to allow this to happen.  If you have received a bill from [facility] which shows your insurance did not pay anything toward your monitor, please give me a call.  I can reach out to our representative to see what can be done. If possible please give me a call to inform me of claim/ billing status.

## 2021-09-12 DIAGNOSIS — H50331 Intermittent monocular exotropia, right eye: Secondary | ICD-10-CM | POA: Diagnosis not present

## 2021-09-12 DIAGNOSIS — H5030 Unspecified intermittent heterotropia: Secondary | ICD-10-CM | POA: Diagnosis not present

## 2021-09-12 DIAGNOSIS — Z9889 Other specified postprocedural states: Secondary | ICD-10-CM | POA: Diagnosis not present

## 2021-09-12 DIAGNOSIS — Z961 Presence of intraocular lens: Secondary | ICD-10-CM | POA: Diagnosis not present

## 2021-09-12 DIAGNOSIS — H5010 Unspecified exotropia: Secondary | ICD-10-CM | POA: Diagnosis not present

## 2021-09-16 NOTE — Telephone Encounter (Signed)
5 mg samples okay to give out.

## 2021-09-16 NOTE — Telephone Encounter (Signed)
Medication samples have been provided to the patient.  Drug name: Eliquis 5mg  Qty: 2 boxes LOT: [id number] Exp.Date: 11/24  Samples left at front desk for patient pick-up. Patient notified.

## 2021-09-24 ENCOUNTER — Encounter: Payer: Self-pay | Admitting: Internal Medicine

## 2021-09-24 ENCOUNTER — Telehealth: Payer: Self-pay | Admitting: Internal Medicine

## 2021-09-24 ENCOUNTER — Ambulatory Visit
Admission: RE | Admit: 2021-09-24 | Discharge: 2021-09-24 | Disposition: A | Discharge status: Home / Self Care | Payer: Medicare HMO | Source: Ambulatory Visit | Attending: Internal Medicine | Admitting: Internal Medicine

## 2021-09-24 ENCOUNTER — Other Ambulatory Visit: Payer: Self-pay

## 2021-09-24 ENCOUNTER — Ambulatory Visit (INDEPENDENT_AMBULATORY_CARE_PROVIDER_SITE_OTHER): Payer: Medicare HMO | Admitting: Internal Medicine

## 2021-09-24 VITALS — Temp 98.7°F

## 2021-09-24 DIAGNOSIS — R531 Weakness: Secondary | ICD-10-CM | POA: Diagnosis not present

## 2021-09-24 DIAGNOSIS — I10 Essential (primary) hypertension: Secondary | ICD-10-CM

## 2021-09-24 DIAGNOSIS — Z8669 Personal history of other diseases of the nervous system and sense organs: Secondary | ICD-10-CM | POA: Diagnosis not present

## 2021-09-24 DIAGNOSIS — B349 Viral infection, unspecified: Secondary | ICD-10-CM

## 2021-09-24 DIAGNOSIS — R051 Acute cough: Secondary | ICD-10-CM

## 2021-09-24 DIAGNOSIS — R059 Cough, unspecified: Secondary | ICD-10-CM | POA: Diagnosis not present

## 2021-09-24 DIAGNOSIS — Z7901 Long term (current) use of anticoagulants: Secondary | ICD-10-CM | POA: Diagnosis not present

## 2021-09-24 DIAGNOSIS — I480 Paroxysmal atrial fibrillation: Secondary | ICD-10-CM

## 2021-09-24 MED ORDER — PROMETHAZINE HCL 25 MG PO TABS: 25.0000 mg | ORAL_TABLET | Freq: Three times a day (TID) | ORAL | 0 refills | Status: DC | PRN

## 2021-09-24 MED ORDER — DOXYCYCLINE HYCLATE 100 MG PO TABS: 100.0000 mg | ORAL_TABLET | Freq: Two times a day (BID) | ORAL | 0 refills | Status: DC

## 2021-09-24 MED ORDER — HYDROCODONE BIT-HOMATROP MBR 5-1.5 MG/5ML PO SOLN: 5.0000 mL | Freq: Three times a day (TID) | ORAL | 0 refills | Status: DC | PRN

## 2021-09-24 NOTE — Telephone Encounter (Signed)
HYDROcodone bit-homatropine (HYCODAN) 5-1.5 MG/5ML syrup  [facility] [address] called from [place] and said they do not have this medication, however he found a [facility] that does have it but he can not transfer it because it is controlled. He said it you would send to the above pharmacy they could fill it. I did let him know you were already gone it would probably be in the morning before this could be done.

## 2021-09-24 NOTE — Telephone Encounter (Signed)
Scheduled car visit 

## 2021-09-24 NOTE — Telephone Encounter (Signed)
COVID test negative, scheduled Car Visit

## 2021-09-24 NOTE — Telephone Encounter (Signed)
Olivia Reynolds [phone]  Dvora called to say she started getting sick Christmas Eve with Chills, Nausea, Vomiting, Headache, she is still having nausea, Bad Headache, light heady, dizzy, muscle spasms not able to eat solid food, her BP last night was 165/90, this morning was 162/90. She tested for COVID on Christmas Eve and it was negative, I ask her to take another one. She did state that her 2 grandsons and son-in law was sick prior to her getting sick but they were not tested for anything.

## 2021-09-24 NOTE — Patient Instructions (Addendum)
Have chest x-ray today.  Take doxycycline 100 mg twice daily for 10 days.  May take Hycodan if needed for cough.  Instead of Zofran which is not working for nausea, you may try Phenergan 25 mg tablets every 4-8 hours if needed.  Rest at home and drink plenty of fluids.  We will call you as soon as we know chest x-ray results.  Rapid flu test is negative.

## 2021-09-24 NOTE — Progress Notes (Signed)
° °  Subjective:    Patient ID: Olivia Reynolds, female    DOB: 1951-01-30, 70 y.o.   MRN: [id number]  HPI 70 year old Female seen today with complaint of malaise, nausea, headache, lightheadedness and dizziness.  Has not been able to eat solid food.  Says blood pressure has been elevated at 165/90.  She tested negative for COVID on Christmas Eve and again today the result was negative.  2 grandsons and son-in-law were sick around Christmas but they did not test for COVID or flu.  Patient did receive flu vaccine in mid November.  She has had generalized weakness and felt unsteady driving to the office.  We performed a rapid flu test here at the office and it was negative.   In 2019, she was diagnosed with bibasilar pneumonia.  She has a history of paroxysmal atrial fibrillation and GE reflux.  She had fundoplication in the remote past.  She is on chronic anticoagulation for atrial fibrillation.  History of hyperlipidemia.  She does not smoke.  Social alcohol consumption.  Review of Systems she looks pale and fatigued.     Objective:   Physical Exam She sounds hoarse and congested.  She is afebrile.  Not tachypneic.  TMs are slightly dull bilaterally.  Pharynx slightly injected.  Chest exam reveals coarse breath sounds in both bases.  Rapid flu test was negative.       Assessment & Plan:  She is tested negative for flu and COVID today.  I am concerned she may have pneumonia.  She will be sent for chest x-ray.  I am starting her on doxycycline 100 mg twice daily for 10 days, Hycodan 1 teaspoon every 8 hours if needed for cough.  Instead of Zofran which is not working for nausea she may try Phenergan 25 mg tablets every 4-8 hours if needed.  Rest at home and drink plenty of fluids.

## 2021-09-25 NOTE — Telephone Encounter (Signed)
She will hold off on cough syrup for now. She feels a little better today. She will let us know if she changes her mind about cough syrup.

## 2021-10-01 NOTE — Progress Notes (Signed)
HPI: FU atrial fibrillation. Last echocardiogram April 2018 showed normal LV function, mild left ventricular hypertrophy, grade 1 diastolic dysfunction.  Nuclear study March 2019 showed ejection fraction 63% and probable shifting breast attenuation.  Monitor November 2022 showed sinus rhythm with PACs, brief PAT, rare PVC and couplet.  Patient triggered events associated with sinus rhythm.  Since I last saw her, there is no dyspnea, chest pain or syncope.  No bleeding.  She has occasional palpitations that are short-lived.  Current Outpatient Medications  Medication Sig Dispense Refill   acetaminophen (TYLENOL) 500 MG tablet Take 500 mg by mouth every 6 (six) hours as needed.     apixaban (ELIQUIS) 5 MG TABS tablet Take 1 tablet (5 mg total) by mouth 2 (two) times daily. 28 tablet 0   buPROPion (WELLBUTRIN XL) 300 MG 24 hr tablet Take 300 mg by mouth every morning.     diltiazem (CARDIZEM CD) 240 MG 24 hr capsule TAKE 1 CAPSULE(240 MG) BY MOUTH DAILY 90 capsule 1   metoprolol succinate (TOPROL XL) 25 MG 24 hr tablet Take 1.5 tablets (37.5 mg total) by mouth daily. 135 tablet 1   mirtazapine (REMERON) 15 MG tablet TAKE 1 TABLET(15 MG) BY MOUTH AT BEDTIME 30 tablet 5   pantoprazole (PROTONIX) 40 MG tablet TAKE 1 TABLET(40 MG) BY MOUTH TWICE DAILY 60 tablet 4   rosuvastatin (CRESTOR) 20 MG tablet TAKE 1 TABLET BY MOUTH EVERY DAY 90 tablet 1   temazepam (RESTORIL) 15 MG capsule Take 15 mg by mouth at bedtime as needed.     OVER THE COUNTER MEDICATION Tylenol Migraine , as needed (Patient not taking: Reported on 10/15/2021)     No current facility-administered medications for this visit.     Past Medical History:  Diagnosis Date   Chronic headaches    Depression    Diastolic dysfunction    a. 12/2013 Echo: EF 60-65%, Gr1 DD, Ao sclerosis w/o stenosis, triv MR, mod dil LA, mild TR.   Diverticulosis    GERD (gastroesophageal reflux disease)    History of hiatal hernia    with repair    History of stress test    a. 10/2008 MV: Ef 75%, nl perfusion.   HTN (hypertension)    Hyperlipidemia    Migraine    Obesity    Osteopenia    PAF (paroxysmal atrial fibrillation) ([place])    a. Anticoagulated w/ Eliquis (CHA2DS2VASc = 3).    Past Surgical History:  Procedure Laterality Date   CATARACT EXTRACTION, BILATERAL     COLONOSCOPY     ELBOW SURGERY  left   Tendon Surgery   ESOPHAGEAL MANOMETRY N/A 02/23/2018   Procedure: ESOPHAGEAL MANOMETRY (EM);  Surgeon: Mauri Pole, MD;  Location: [facility];  Service: Endoscopy;  Laterality: N/A;   EYE SURGERY Left    L-yey myopia   HERNIA REPAIR  6599   nissan fundiplication   KNEE ARTHROSCOPY  2005   R-knee   LAPAROSCOPIC NISSEN FUNDOPLICATION     ROTATOR CUFF REPAIR Right    TONSILLECTOMY     TUBAL LIGATION     UPPER GASTROINTESTINAL ENDOSCOPY      Social History   Socioeconomic History   Marital status: Divorced    Spouse name: Not on file   Number of children: 3   Years of education: Not on file   Highest education level: Not on file  Occupational History   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Medicine    Comment:  retired  Tobacco Use   Smoking status: Former    Types: Cigarettes    Quit date: 09/07/1971    Years since quitting: 50.1   Smokeless tobacco: Never  Vaping Use   Vaping Use: Never used  Substance and Sexual Activity   Alcohol use: Not Currently   Drug use: No   Sexual activity: Not on file  Other Topics Concern   Not on file  Social History Narrative   Divorced, 3 children lives alone   She is a retired Development worker, international aid of the Nurse, adult   Former smoker no alcohol no drug use   Social Determinants of Sales executive: Not on Art therapist Insecurity: Not on file  Transportation Needs: Not on file  Physical Activity: Not on file  Stress: Not on file  Social Connections: Not on file  Intimate Partner Violence: Not on file    Family History   Problem Relation Age of Onset   COPD Mother    Asthma Mother    Arthritis Father    Hypertension Father    Stroke Father    Cancer Father    Prostate cancer Father    Hypertension Sister    Diabetes Sister    Hypertension Brother    Cancer Sister        UTERINE   Cancer Son        TESTICULAR   Crohn's disease Daughter    Pancreatic cancer Other    Colon cancer Cousin    Esophageal cancer Neg Hx    Stomach cancer Neg Hx    Liver disease Neg Hx    Rectal cancer Neg Hx     ROS: no fevers or chills, productive cough, hemoptysis, dysphasia, odynophagia, melena, hematochezia, dysuria, hematuria, rash, seizure activity, orthopnea, PND, pedal edema, claudication. Remaining systems are negative.  Physical Exam: Well-developed well-nourished in no acute distress.  Skin is warm and dry.  HEENT is normal.  Neck is supple.  Chest is clear to auscultation with normal expansion.  Cardiovascular exam is regular rate and rhythm.  Abdominal exam nontender or distended. No masses palpated. Extremities show no edema. neuro grossly intact   A/P  1 paroxysmal atrial fibrillation-patient remains in sinus rhythm.  Continue Cardizem and metoprolol at present dose.  Continue apixaban.  2 hypertension-blood pressure;   3 hyperlipidemia-continue statin.  4 chronic diastolic congestive heart failure-continue diuretic as needed.  She appears to be euvolemic.  Kirk Ruths, MD

## 2021-10-13 ENCOUNTER — Other Ambulatory Visit: Payer: Self-pay

## 2021-10-13 DIAGNOSIS — E559 Vitamin D deficiency, unspecified: Secondary | ICD-10-CM

## 2021-10-13 DIAGNOSIS — M8580 Other specified disorders of bone density and structure, unspecified site: Secondary | ICD-10-CM

## 2021-10-15 ENCOUNTER — Other Ambulatory Visit: Payer: Self-pay

## 2021-10-15 ENCOUNTER — Ambulatory Visit: Payer: Medicare HMO | Admitting: Cardiology

## 2021-10-15 ENCOUNTER — Encounter: Payer: Self-pay | Admitting: Cardiology

## 2021-10-15 VITALS — BP 144/78 | HR 75 | Ht 63.75 in | Wt 193.0 lb

## 2021-10-15 DIAGNOSIS — I10 Essential (primary) hypertension: Secondary | ICD-10-CM

## 2021-10-15 DIAGNOSIS — I110 Hypertensive heart disease with heart failure: Secondary | ICD-10-CM | POA: Diagnosis not present

## 2021-10-15 DIAGNOSIS — E7800 Pure hypercholesterolemia, unspecified: Secondary | ICD-10-CM

## 2021-10-15 DIAGNOSIS — I5032 Chronic diastolic (congestive) heart failure: Secondary | ICD-10-CM | POA: Diagnosis not present

## 2021-10-15 DIAGNOSIS — I480 Paroxysmal atrial fibrillation: Secondary | ICD-10-CM | POA: Diagnosis not present

## 2021-10-15 NOTE — Patient Instructions (Signed)

## 2021-10-21 ENCOUNTER — Ambulatory Visit: Payer: Medicare HMO | Admitting: Internal Medicine

## 2021-11-12 ENCOUNTER — Encounter: Payer: Self-pay | Admitting: Internal Medicine

## 2021-11-12 ENCOUNTER — Ambulatory Visit: Payer: Medicare HMO | Admitting: Internal Medicine

## 2021-11-12 ENCOUNTER — Telehealth: Payer: Self-pay

## 2021-11-12 VITALS — BP 126/68 | HR 70 | Ht 64.5 in | Wt 192.0 lb

## 2021-11-12 DIAGNOSIS — E739 Lactose intolerance, unspecified: Secondary | ICD-10-CM | POA: Insufficient documentation

## 2021-11-12 DIAGNOSIS — I700 Atherosclerosis of aorta: Secondary | ICD-10-CM

## 2021-11-12 DIAGNOSIS — Z7901 Long term (current) use of anticoagulants: Secondary | ICD-10-CM

## 2021-11-12 DIAGNOSIS — R1319 Other dysphagia: Secondary | ICD-10-CM

## 2021-11-12 NOTE — Progress Notes (Addendum)
Olivia Reynolds 71 y.o. 30-Nov-1950 [id number]  Assessment & Plan:   Encounter Diagnoses  Name Primary?   Esophageal dysphagia Yes   Chronic anticoagulation    Lactose intolerance    Schedule EGD with balloon dilation.  Hold Eliquis 2 days prior to the procedure (day before and day of).  Clarify with cardiology.  Rare but real risk of stroke off anticoagulation explained to the patient and we reviewed the traditional risks of endoscopy and dilation as well. She will continue PPI as well.  She will continue to avoid milk products as she seems to have self diagnosed herself with lactose intolerance.  Subjective:   Chief Complaint: Dysphagia  HPI 71 year old white woman with a history of chronic recurrent dysphagia issues thought related to prior Nissen fundoplication, here with recurrent dysphagia issues.  Very similar to what she has had in the past and has responded to dilation most recently was a 20 mm balloon dilation at the GE junction in January 2022.  She says this is really the same problem and is interested in a repeat dilation.  Wt Readings from Last 3 Encounters:  11/12/21 192 lb (87.1 kg)  10/15/21 193 lb (87.5 kg)  08/11/21 193 lb (87.5 kg)   She reports that bloating gas and defecation difficulties are all much better after she eliminated dairy.  She had an episode of diverticulitis the other year and did some self learning and also had a conversation with a friend who suggested she try to avoid dairy and she has had a whole lot less bloating and gas problems. Allergies  Allergen Reactions   Erythromycin Hives   Statins     Lipitor   Codeine Hives   Hctz [Hydrochlorothiazide] Rash   Lipitor [Atorvastatin] Other (See Comments)    Joint pain   Current Meds  Medication Sig   acetaminophen (TYLENOL) 500 MG tablet Take 500 mg by mouth every 6 (six) hours as needed.   apixaban (ELIQUIS) 5 MG TABS tablet Take 1 tablet (5 mg total) by mouth 2 (two) times daily.    buPROPion (WELLBUTRIN XL) 300 MG 24 hr tablet Take 300 mg by mouth every morning.   diltiazem (CARDIZEM CD) 240 MG 24 hr capsule TAKE 1 CAPSULE(240 MG) BY MOUTH DAILY   metoprolol succinate (TOPROL XL) 25 MG 24 hr tablet Take 1.5 tablets (37.5 mg total) by mouth daily.   mirtazapine (REMERON) 15 MG tablet TAKE 1 TABLET(15 MG) BY MOUTH AT BEDTIME   OVER THE COUNTER MEDICATION Tylenol Migraine , as needed   pantoprazole (PROTONIX) 40 MG tablet TAKE 1 TABLET(40 MG) BY MOUTH TWICE DAILY   rosuvastatin (CRESTOR) 20 MG tablet TAKE 1 TABLET BY MOUTH EVERY DAY   temazepam (RESTORIL) 15 MG capsule Take 15 mg by mouth at bedtime as needed.   Past Medical History:  Diagnosis Date   Chronic headaches    Depression    Diastolic dysfunction    a. 12/2013 Echo: EF 60-65%, Gr1 DD, Ao sclerosis w/o stenosis, triv MR, mod dil LA, mild TR.   Diverticulosis    GERD (gastroesophageal reflux disease)    History of hiatal hernia    with repair   History of stress test    a. 10/2008 MV: Ef 75%, nl perfusion.   HTN (hypertension)    Hyperlipidemia    Migraine    Obesity    Osteopenia    PAF (paroxysmal atrial fibrillation) ([place])    a. Anticoagulated w/ Eliquis (CHA2DS2VASc = 3).  Past Surgical History:  Procedure Laterality Date   CATARACT EXTRACTION, BILATERAL     COLONOSCOPY     ELBOW SURGERY  left   Tendon Surgery   ESOPHAGEAL MANOMETRY N/A 02/23/2018   Procedure: ESOPHAGEAL MANOMETRY (EM);  Surgeon: Mauri Pole, MD;  Location: [facility];  Service: Endoscopy;  Laterality: N/A;   EYE SURGERY Left    L-yey myopia   HERNIA REPAIR  9675   nissan fundiplication   KNEE ARTHROSCOPY  2005   R-knee   LAPAROSCOPIC NISSEN FUNDOPLICATION     ROTATOR CUFF REPAIR Right    TONSILLECTOMY     TUBAL LIGATION     UPPER GASTROINTESTINAL ENDOSCOPY     Social History   Social History Narrative   Divorced, 3 children lives alone   She is a retired Development worker, [employer]    Former smoker no alcohol no drug use   family history includes Arthritis in her father; Asthma in her mother; COPD in her mother; Cancer in her father, sister, and son; Colon cancer in her cousin; Crohn's disease in her daughter; Diabetes in her sister; Hypertension in her brother, father, and sister; Pancreatic cancer in an other family member; Prostate cancer in her father; Stroke in her father.   Review of Systems As per HPI  Objective:   Physical Exam BP 126/68    Pulse 70    Ht 5' 4.5" (1.638 m)    Wt 192 lb (87.1 kg)    BMI 32.45 kg/m  Lungs cta Cor NL S1S2 no rmg

## 2021-11-12 NOTE — Patient Instructions (Addendum)
You have been scheduled for an endoscopy. Please follow written instructions given to you at your visit today. If you use inhalers (even only as needed), please bring them with you on the day of your procedure.  If you are age 71 or older, your body mass index should be between 23-30. Your Body mass index is 32.45 kg/m. If this is out of the aforementioned range listed, please consider follow up with your Primary Care Provider.  If you are age 41 or younger, your body mass index should be between 19-25. Your Body mass index is 32.45 kg/m. If this is out of the aformentioned range listed, please consider follow up with your Primary Care Provider.   ________________________________________________________  The [facility] providers would like to encourage you to use [facility] to communicate with providers for non-urgent requests or questions.  Due to long hold times on the telephone, sending your provider a message by [facility] may be a faster and more efficient way to get a response.  Please allow 48 business hours for a response.  Please remember that this is for non-urgent requests.  _______________________________________________________  Dennis Bast will be contacted by our office prior to your procedure for directions on holding your Eliquis. If you do not hear from our office 1 week prior to your scheduled procedure, please call [phone] to discuss.    I appreciate the opportunity to care for you. Silvano Rusk, MD, [facility]

## 2021-11-12 NOTE — Telephone Encounter (Signed)
[facility] Pre-operative Risk Assessment     Request for surgical clearance:     Endoscopy Procedure  What type of surgery is being performed?     EGD  When is this surgery scheduled?     12/15/2021  What type of clearance is required ?   Pharmacy  Are there any medications that need to be held prior to surgery and how long? Eliquis, Day before and day of procedure  Practice name and name of physician performing surgery?      [facility]  What is your office phone and fax number?      Phone- [phone]  [phone]  Anesthesia type (None, local, MAC, general) ?       MAC

## 2021-11-13 DIAGNOSIS — I700 Atherosclerosis of aorta: Secondary | ICD-10-CM | POA: Insufficient documentation

## 2021-11-13 NOTE — Telephone Encounter (Signed)
Elmyra Ricks, PA  Cv Div Preop Callback 20 minutes ago (1:24 PM)   Please let the patient know,

## 2021-11-13 NOTE — Telephone Encounter (Signed)
Patient with diagnosis of afib on Eliquis for anticoagulation.    Procedure: EGD Date of procedure: 12/15/21  CHA2DS2-VASc Score = 5  This indicates a 7.2% annual risk of stroke. The patient's score is based upon: CHF History: 1 HTN History: 1 Diabetes History: 0 Stroke History: 0 Vascular Disease History: 1 Age Score: 1 Gender Score: 1  Aortic atherosclerosis noted on abdominal CT 03/2021. Problem list has been updated.  CrCl 42mL/min using adjusted body weight Platelet count 348K  Per office protocol, patient can hold Eliquis for 1 day prior to procedure as requested.

## 2021-11-13 NOTE — Telephone Encounter (Signed)
Contacted  the patient and left a detailed message informing her of the pharmacies recommendation of holding Eilquis one day prior to procedure. Advised a call back with any questions or concerns.

## 2021-11-13 NOTE — Telephone Encounter (Signed)
° °  Name: Olivia Reynolds  DOB: 12-17-1950  MRN: [id number]   Primary Cardiologist: Kirk Ruths, MD  Chart reviewed as part of pre-operative protocol coverage. We are asked for guidance to hold [facility] for EGD. Per our clinical pharmacist:  Per office protocol, patient can hold Eliquis for 1 day prior to procedure as requested.    I will route this recommendation to the requesting party via Epic fax function and remove from pre-op pool. Please call with questions.  Tami Lin Markeisha Mancias, PA 11/13/2021, 1:23 PM

## 2021-11-25 ENCOUNTER — Other Ambulatory Visit: Payer: Self-pay | Admitting: Internal Medicine

## 2021-12-02 DIAGNOSIS — H50331 Intermittent monocular exotropia, right eye: Secondary | ICD-10-CM | POA: Diagnosis not present

## 2021-12-02 DIAGNOSIS — Z9889 Other specified postprocedural states: Secondary | ICD-10-CM | POA: Diagnosis not present

## 2021-12-15 ENCOUNTER — Encounter: Payer: Self-pay | Admitting: Internal Medicine

## 2021-12-15 ENCOUNTER — Ambulatory Visit (AMBULATORY_SURGERY_CENTER): Payer: Medicare HMO | Admitting: Internal Medicine

## 2021-12-15 DIAGNOSIS — R1319 Other dysphagia: Secondary | ICD-10-CM | POA: Diagnosis not present

## 2021-12-15 DIAGNOSIS — I10 Essential (primary) hypertension: Secondary | ICD-10-CM | POA: Diagnosis not present

## 2021-12-15 DIAGNOSIS — K222 Esophageal obstruction: Secondary | ICD-10-CM | POA: Diagnosis not present

## 2021-12-15 DIAGNOSIS — K219 Gastro-esophageal reflux disease without esophagitis: Secondary | ICD-10-CM | POA: Diagnosis not present

## 2021-12-15 DIAGNOSIS — R1310 Dysphagia, unspecified: Secondary | ICD-10-CM | POA: Diagnosis not present

## 2021-12-15 DIAGNOSIS — K449 Diaphragmatic hernia without obstruction or gangrene: Secondary | ICD-10-CM

## 2021-12-15 MED ORDER — SODIUM CHLORIDE 0.9 % IV SOLN: 500.0000 mL | INTRAVENOUS | Status: DC

## 2021-12-15 NOTE — Progress Notes (Signed)
Report to PACU, RN, vss, BBS= Clear.  

## 2021-12-15 NOTE — Progress Notes (Signed)
Pt's states no medical or surgical changes since previsit or office visit. 

## 2021-12-15 NOTE — Progress Notes (Signed)
Called to room to assist during endoscopic procedure.  Patient ID and intended procedure confirmed with present staff. Received instructions for my participation in the procedure from the performing physician.  

## 2021-12-15 NOTE — Patient Instructions (Signed)
Dilation diet today - see handout. ? ?Continue present medications. ? ?Resume Eliquis at prior dose tomorrow. ? ?Repeat upper endoscopy as needed for retreatment. ? ? ?YOU HAD AN ENDOSCOPIC PROCEDURE TODAY AT [facility]:   Refer to the procedure report that was given to you for any specific questions about what was found during the examination.  If the procedure report does not answer your questions, please call your gastroenterologist to clarify.  If you requested that your care partner not be given the details of your procedure findings, then the procedure report has been included in a sealed envelope for you to review at your convenience later. ? ?YOU SHOULD EXPECT: Some feelings of bloating in the abdomen. Passage of more gas than usual.  Walking can help get rid of the air that was put into your GI tract during the procedure and reduce the bloating. If you had a lower endoscopy (such as a colonoscopy or flexible sigmoidoscopy) you may notice spotting of blood in your stool or on the toilet paper. If you underwent a bowel prep for your procedure, you may not have a normal bowel movement for a few days. ? ?Please Note:  You might notice some irritation and congestion in your nose or some drainage.  This is from the oxygen used during your procedure.  There is no need for concern and it should clear up in a day or so. ? ?SYMPTOMS TO REPORT IMMEDIATELY: ? ? ?Following upper endoscopy (EGD) ? Vomiting of blood or coffee ground material ? New chest pain or pain under the shoulder blades ? Painful or persistently difficult swallowing ? New shortness of breath ? Fever of 100?F or higher ? Black, tarry-looking stools ? ?For urgent or emergent issues, a gastroenterologist can be reached at any hour by calling [phone]. ?Do not use MyChart messaging for urgent concerns.  ? ? ?DIET:  We do recommend a small meal at first, but then you may proceed to your regular diet.  Drink plenty of fluids but you  should avoid alcoholic beverages for 24 hours. ? ?ACTIVITY:  You should plan to take it easy for the rest of today and you should NOT DRIVE or use heavy machinery until tomorrow (because of the sedation medicines used during the test).   ? ?FOLLOW UP: ?Our staff will call the number listed on your records 48-72 hours following your procedure to check on you and address any questions or concerns that you may have regarding the information given to you following your procedure. If we do not reach you, we will leave a message.  We will attempt to reach you two times.  During this call, we will ask if you have developed any symptoms of COVID 19. If you develop any symptoms (ie: fever, flu-like symptoms, shortness of breath, cough etc.) before then, please call [phone].  If you test positive for Covid 19 in the 2 weeks post procedure, please call and report this information to [place].   ? ?If any biopsies were taken you will be contacted by phone or by letter within the next 1-3 weeks.  Please call us at [phone] if you have not heard about the biopsies in 3 weeks.  ? ? ?SIGNATURES/CONFIDENTIALITY: ?You and/or your care partner have signed paperwork which will be entered into your electronic medical record.  These signatures attest to the fact that that the information above on your After Visit Summary has been reviewed and is understood.  Full responsibility  of the confidentiality of this discharge information lies with you and/or your care-partner.  ?

## 2021-12-15 NOTE — Progress Notes (Signed)
[facility] History and Physical ? ? ?Primary Care Physician:  Elby Showers, MD ? ? ?Reason for Procedure:   dysphagia ? ?Plan:    EGD, dilate esophagus ? ? ? ? ?HPI: Olivia Reynolds is a 71 y.o. female  with a history of chronic recurrent dysphagia issues thought related to prior Nissen fundoplication, here with recurrent dysphagia issues.  Very similar to what she has had in the past and has responded to dilation most recently was a 20 mm balloon dilation at the GE junction in January 2022.  She says this is really the same problem and is interested in a repeat dilation. ?  ?   ?Wt Readings from Last 3 Encounters:  ?11/12/21 192 lb (87.1 kg)  ?10/15/21 193 lb (87.5 kg)  ?08/11/21 193 lb (87.5 kg)  ?  ?She reports that bloating gas and defecation difficulties are all much better after she eliminated dairy.  She had an episode of diverticulitis the other year and did some self learning and also had a conversation with a friend who suggested she try to avoid dairy and she has had a whole lot less bloating and gas problems. ? ?Last dose Eliquis 12/12/21 ?Past Medical History:  ?Diagnosis Date  ? Chronic headaches   ? Depression   ? Diastolic dysfunction   ? a. 12/2013 Echo: EF 60-65%, Gr1 DD, Ao sclerosis w/o stenosis, triv MR, mod dil LA, mild TR.  ? Diverticulosis   ? GERD (gastroesophageal reflux disease)   ? History of hiatal hernia   ? with repair  ? History of stress test   ? a. 10/2008 MV: Ef 75%, nl perfusion.  ? HTN (hypertension)   ? Hyperlipidemia   ? Migraine   ? Obesity   ? Osteopenia   ? PAF (paroxysmal atrial fibrillation) ([place])   ? a. Anticoagulated w/ Eliquis (CHA2DS2VASc = 3).  ? ? ?Past Surgical History:  ?Procedure Laterality Date  ? CATARACT EXTRACTION, BILATERAL    ? COLONOSCOPY    ? ELBOW SURGERY  left  ? Tendon Surgery  ? ESOPHAGEAL MANOMETRY N/A 02/23/2018  ? Procedure: ESOPHAGEAL MANOMETRY (EM);  Surgeon: Mauri Pole, MD;  Location: [facility];  Service: Endoscopy;   Laterality: N/A;  ? EYE SURGERY Left   ? L-yey myopia  ? HERNIA REPAIR  2008  ? nissan fundiplication  ? KNEE ARTHROSCOPY  2005  ? R-knee  ? LAPAROSCOPIC NISSEN FUNDOPLICATION    ? ROTATOR CUFF REPAIR Right   ? TONSILLECTOMY    ? TUBAL LIGATION    ? UPPER GASTROINTESTINAL ENDOSCOPY    ? ? ?Prior to Admission medications   ?Medication Sig Start Date End Date Taking? Authorizing Provider  ?acetaminophen (TYLENOL) 500 MG tablet Take 500 mg by mouth every 6 (six) hours as needed.   Yes [provider]  ?buPROPion (WELLBUTRIN XL) 300 MG 24 hr tablet Take 300 mg by mouth every morning. 09/29/21  Yes [provider]  ?diltiazem (CARDIZEM CD) 240 MG 24 hr capsule TAKE 1 CAPSULE(240 MG) BY MOUTH DAILY 07/29/21  Yes Baxley, Cresenciano Lick, MD  ?metoprolol succinate (TOPROL XL) 25 MG 24 hr tablet Take 1.5 tablets (37.5 mg total) by mouth daily. 07/25/21  Yes Almyra Deforest, PA  ?mirtazapine (REMERON) 15 MG tablet TAKE 1 TABLET(15 MG) BY MOUTH AT BEDTIME 11/26/21  Yes Gatha Mayer, MD  ?pantoprazole (PROTONIX) 40 MG tablet TAKE 1 TABLET(40 MG) BY MOUTH TWICE DAILY 01/30/21  Yes Zehr, Janett Billow D, PA-C  ?rosuvastatin (CRESTOR)  20 MG tablet TAKE 1 TABLET BY MOUTH EVERY DAY 08/18/21  Yes Lelon Perla, MD  ?temazepam (RESTORIL) 15 MG capsule Take 15 mg by mouth at bedtime as needed. 10/08/21  Yes [provider]  ?apixaban (ELIQUIS) 5 MG TABS tablet Take 1 tablet (5 mg total) by mouth 2 (two) times daily. 07/25/21   Lendon Colonel, NP  ?OVER THE COUNTER MEDICATION Tylenol Migraine , as needed    [provider]  ? ? ?Current Outpatient Medications  ?Medication Sig Dispense Refill  ? acetaminophen (TYLENOL) 500 MG tablet Take 500 mg by mouth every 6 (six) hours as needed.    ? buPROPion (WELLBUTRIN XL) 300 MG 24 hr tablet Take 300 mg by mouth every morning.    ? diltiazem (CARDIZEM CD) 240 MG 24 hr capsule TAKE 1 CAPSULE(240 MG) BY MOUTH DAILY 90 capsule 1  ? metoprolol succinate (TOPROL XL) 25 MG 24 hr  tablet Take 1.5 tablets (37.5 mg total) by mouth daily. 135 tablet 1  ? mirtazapine (REMERON) 15 MG tablet TAKE 1 TABLET(15 MG) BY MOUTH AT BEDTIME 30 tablet 5  ? pantoprazole (PROTONIX) 40 MG tablet TAKE 1 TABLET(40 MG) BY MOUTH TWICE DAILY 60 tablet 4  ? rosuvastatin (CRESTOR) 20 MG tablet TAKE 1 TABLET BY MOUTH EVERY DAY 90 tablet 1  ? temazepam (RESTORIL) 15 MG capsule Take 15 mg by mouth at bedtime as needed.    ? apixaban (ELIQUIS) 5 MG TABS tablet Take 1 tablet (5 mg total) by mouth 2 (two) times daily. 28 tablet 0  ? OVER THE COUNTER MEDICATION Tylenol Migraine , as needed    ? ?Current Facility-Administered Medications  ?Medication Dose Route Frequency Provider Last Rate Last Admin  ? 0.9 %  sodium chloride infusion  500 mL Intravenous Continuous Gatha Mayer, MD      ? ? ?Allergies as of 12/15/2021 - Review Complete 12/15/2021  ?Allergen Reaction Noted  ? Erythromycin Hives   ? Statins  01/07/2011  ? Codeine Hives   ? Hctz [hydrochlorothiazide] Rash 10/15/2011  ? Lipitor [atorvastatin] Other (See Comments) 07/29/2017  ? ? ?Family History  ?Problem Relation Age of Onset  ? COPD Mother   ? Asthma Mother   ? Arthritis Father   ? Hypertension Father   ? Stroke Father   ? Cancer Father   ? Prostate cancer Father   ? Hypertension Sister   ? Diabetes Sister   ? Hypertension Brother   ? Cancer Sister   ?     UTERINE  ? Cancer Son   ?     TESTICULAR  ? Crohn's disease Daughter   ? Pancreatic cancer Other   ? Colon cancer Cousin   ? Esophageal cancer Neg Hx   ? Stomach cancer Neg Hx   ? Liver disease Neg Hx   ? Rectal cancer Neg Hx   ? ? ?Social History  ? ?Socioeconomic History  ? Marital status: Divorced  ?  Spouse name: Not on file  ? Number of children: 3  ? Years of education: Not on file  ? Highest education level: Not on file  ?Occupational History  ? Occupation: [employer]  ?  Comment: retired  ?Tobacco Use  ? Smoking status: Former  ?  Types: Cigarettes  ?  Quit date:  09/07/1971  ?  Years since quitting: 50.3  ? Smokeless tobacco: Never  ?Vaping Use  ? Vaping Use: Never used  ?Substance and Sexual Activity  ?  Alcohol use: Not Currently  ? Drug use: No  ? Sexual activity: Not on file  ?Other Topics Concern  ? Not on file  ?Social History Narrative  ? Divorced, 3 children lives alone  ? She is a retired Musician  ? Former smoker no alcohol no drug use  ? ?Social Determinants of Health  ? ?Financial Resource Strain: Not on file  ?Food Insecurity: Not on file  ?Transportation Needs: Not on file  ?Physical Activity: Not on file  ?Stress: Not on file  ?Social Connections: Not on file  ?Intimate Partner Violence: Not on file  ? ? ?Review of Systems: ? ?All other review of systems negative except as mentioned in the HPI. ? ?Physical Exam: ?Vital signs ?BP 120/67   Pulse 65   Temp 98 ?F (36.7 ?C) (Temporal)   Ht '5\' 4"'$  (1.626 m)   Wt 192 lb (87.1 kg)   SpO2 96%   BMI 32.96 kg/m?  ? ?General:   Alert,  Well-developed, well-nourished, pleasant and cooperative in NAD ?Lungs:  Clear throughout to auscultation.   ?Heart:  Regular rate and rhythm; no murmurs, clicks, rubs,  or gallops. ?Abdomen:  Soft, nontender and nondistended. Normal bowel sounds.   ?Neuro/Psych:  Alert and cooperative. Normal mood and affect. A and O x 3 ? ? ?'@Allex Madia'$  Simonne Maffucci, MD, [facility] ?[facility] ?[phone] (pager) ?12/15/2021 3:49 PM@ ? ?

## 2021-12-15 NOTE — Op Note (Signed)
[facility] ?Patient Name: Olivia Reynolds ?Procedure Date: 12/15/2021 3:49 PM ?MRN: [id number] ?Endoscopist: Gatha Mayer , MD ?Age: 71 ?Referring MD:  ?Date of Birth: 12/01/1950 ?Gender: Female ?Account #: [id number] ?Procedure:                Upper GI endoscopy ?Indications:              Dysphagia ?Medicines:                Monitored Anesthesia Care ?Procedure:                Pre-Anesthesia Assessment: ?                          - Prior to the procedure, a History and Physical  ?                          was performed, and patient medications and  ?                          allergies were reviewed. The patient's tolerance of  ?                          previous anesthesia was also reviewed. The risks  ?                          and benefits of the procedure and the sedation  ?                          options and risks were discussed with the patient.  ?                          All questions were answered, and informed consent  ?                          was obtained. Prior Anticoagulants: The patient  ?                          last took Eliquis (apixaban) 2 days prior to the  ?                          procedure. ASA Grade Assessment: II - A patient  ?                          with mild systemic disease. After reviewing the  ?                          risks and benefits, the patient was deemed in  ?                          satisfactory condition to undergo the procedure. ?                          After obtaining informed consent, the endoscope was  ?  passed under direct vision. Throughout the  ?                          procedure, the patient's blood pressure, pulse, and  ?                          oxygen saturations were monitored continuously. The  ?                          Endoscope was introduced through the mouth, and  ?                          advanced to the second part of duodenum. The upper  ?                          GI endoscopy was accomplished without  difficulty.  ?                          The patient tolerated the procedure well. ?Scope In: ?Scope Out: ?Findings:                 The examined esophagus was moderately tortuous. ?                          One benign-appearing, intrinsic moderate  ?                          (circumferential scarring or stenosis; an endoscope  ?                          may pass) stenosis was found at the  ?                          gastroesophageal junction. The stenosis was  ?                          traversed. A TTS dilator was passed through the  ?                          scope. Dilation with an 18-19-20 mm balloon dilator  ?                          was performed to 20 mm. The dilation site was  ?                          examined and showed no change. Estimated blood  ?                          loss: none. ?                          A 5 cm hiatal hernia was present. Two tablets seen  ?                          here ?  Evidence of a prior Nissen fundoplication was found  ?                          in the gastric body. This was characterized by  ?                          moderate stenosis. ?                          The gastroesophageal flap valve was visualized  ?                          endoscopically and classified as Hill Grade I  ?                          (prominent fold, tight to endoscope). ?                          The exam was otherwise without abnormality. ?                          The cardia and gastric fundus were normal on  ?                          retroflexion. ?Complications:            No immediate complications. ?Estimated Blood Loss:     Estimated blood loss: none. ?Impression:               - Tortuous esophagus. ?                          - Benign-appearing esophageal stenosis. Dilated. ?                          - 5 cm hiatal hernia, containing two tablets ?                          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                          by moderate  stenosis. ?                          - Gastroesophageal flap valve classified as Hill  ?                          Grade I (prominent fold, tight to endoscope). ?                          - The examination was otherwise normal. ?                          - No specimens collected. ?Recommendation:           - Patient has a contact number available for  ?  emergencies. The signs and symptoms of potential  ?                          delayed complications were discussed with the  ?                          patient. Return to normal activities tomorrow.  ?                          Written discharge instructions were provided to the  ?                          patient. ?                          - Clear liquids x 1 hour then soft foods rest of  ?                          day. Start prior diet tomorrow. ?                          - Continue present medications. ?                          - Repeat upper endoscopy PRN for retreatment. given  ?                          tablets in hernia sac ? if that is part of the  ?                          prpblem (small recurrent hernia after Nissen  ?                          fundoplication) She has responded to GE junction  ?                          filation in past so we will se. ?                          - Resume Eliquis (apixaban) at prior dose tomorrow. ?Gatha Mayer, MD ?12/15/2021 4:14:16 PM ?This report has been signed electronically. ?

## 2021-12-17 ENCOUNTER — Telehealth: Payer: Self-pay | Admitting: *Deleted

## 2021-12-17 ENCOUNTER — Telehealth: Payer: Self-pay

## 2021-12-17 NOTE — Telephone Encounter (Signed)
Attempted to call patient for their post-procedure follow-up call. No answer. Left voicemail for patient to call us back if they have any questions or concerns. ?

## 2021-12-17 NOTE — Telephone Encounter (Signed)
Called # [phone] and left a message we tried to reach pt for a follow up call. maw  ?

## 2021-12-26 DIAGNOSIS — H5030 Unspecified intermittent heterotropia: Secondary | ICD-10-CM | POA: Diagnosis not present

## 2021-12-26 DIAGNOSIS — H50331 Intermittent monocular exotropia, right eye: Secondary | ICD-10-CM | POA: Diagnosis not present

## 2022-02-04 DIAGNOSIS — H5000 Unspecified esotropia: Secondary | ICD-10-CM | POA: Diagnosis not present

## 2022-02-04 DIAGNOSIS — Z48810 Encounter for surgical aftercare following surgery on the sense organs: Secondary | ICD-10-CM | POA: Diagnosis not present

## 2022-02-04 DIAGNOSIS — Z885 Allergy status to narcotic agent status: Secondary | ICD-10-CM | POA: Diagnosis not present

## 2022-02-04 DIAGNOSIS — Z961 Presence of intraocular lens: Secondary | ICD-10-CM | POA: Diagnosis not present

## 2022-02-11 ENCOUNTER — Telehealth: Payer: Self-pay | Admitting: Internal Medicine

## 2022-02-11 NOTE — Telephone Encounter (Signed)
Olivia Reynolds called back to say she had eye surgery and had a complication and was going to have to have another one and may not be able to come in until late July. I let her know that would be okay as long as she does not need refills on medications. She is going to check with daughter and see if she could bring her one morning for an appointment and labs. ?

## 2022-02-11 NOTE — Telephone Encounter (Signed)
LVM to CB to schedule appointment for 6 month with labs prior. ?

## 2022-02-22 ENCOUNTER — Other Ambulatory Visit: Payer: Self-pay | Admitting: Cardiology

## 2022-02-26 ENCOUNTER — Other Ambulatory Visit: Payer: Self-pay | Admitting: Gastroenterology

## 2022-02-27 ENCOUNTER — Telehealth: Payer: Self-pay

## 2022-02-27 NOTE — Telephone Encounter (Signed)
scheduled

## 2022-02-27 NOTE — Telephone Encounter (Signed)
Patient Olivia Reynolds trying to schedule her CPE. She said to call when convenient. Her surgery is this month so she is asking about mid to late July.

## 2022-03-20 ENCOUNTER — Other Ambulatory Visit: Payer: Self-pay | Admitting: Adult Health

## 2022-03-20 ENCOUNTER — Other Ambulatory Visit: Payer: Self-pay | Admitting: Physician Assistant

## 2022-03-20 DIAGNOSIS — Z9889 Other specified postprocedural states: Secondary | ICD-10-CM | POA: Diagnosis not present

## 2022-03-20 DIAGNOSIS — H5021 Vertical strabismus, right eye: Secondary | ICD-10-CM | POA: Diagnosis not present

## 2022-03-20 DIAGNOSIS — I480 Paroxysmal atrial fibrillation: Secondary | ICD-10-CM

## 2022-03-20 DIAGNOSIS — H5000 Unspecified esotropia: Secondary | ICD-10-CM | POA: Diagnosis not present

## 2022-04-13 ENCOUNTER — Other Ambulatory Visit: Payer: Medicare HMO

## 2022-04-13 DIAGNOSIS — R7302 Impaired glucose tolerance (oral): Secondary | ICD-10-CM | POA: Diagnosis not present

## 2022-04-13 DIAGNOSIS — E785 Hyperlipidemia, unspecified: Secondary | ICD-10-CM | POA: Diagnosis not present

## 2022-04-13 DIAGNOSIS — R5383 Other fatigue: Secondary | ICD-10-CM

## 2022-04-14 LAB — COMPLETE METABOLIC PANEL WITH GFR
AG Ratio: 1.9 (calc) (ref 1.0–2.5)
ALT: 22 U/L (ref 6–29)
AST: 24 U/L (ref 10–35)
Albumin: 4.1 g/dL (ref 3.6–5.1)
Alkaline phosphatase (APISO): 39 U/L (ref 37–153)
BUN: 14 mg/dL (ref 7–25)
CO2: 24 mmol/L (ref 20–32)
Calcium: 9 mg/dL (ref 8.6–10.4)
Chloride: 109 mmol/L (ref 98–110)
Creat: 0.71 mg/dL (ref 0.60–1.00)
Globulin: 2.2 g/dL (calc) (ref 1.9–3.7)
Glucose, Bld: 98 mg/dL (ref 65–99)
Potassium: 4.2 mmol/L (ref 3.5–5.3)
Sodium: 144 mmol/L (ref 135–146)
Total Bilirubin: 0.7 mg/dL (ref 0.2–1.2)
Total Protein: 6.3 g/dL (ref 6.1–8.1)
eGFR: 91 mL/min/{1.73_m2} (ref >=60)

## 2022-04-14 LAB — TSH: TSH: 1.54 mIU/L (ref 0.40–4.50)

## 2022-04-14 LAB — HEMOGLOBIN A1C
Hgb A1c MFr Bld: 5.6 % of total Hgb (ref <5.7)
Mean Plasma Glucose: 114 mg/dL
eAG (mmol/L): 6.3 mmol/L

## 2022-04-16 ENCOUNTER — Ambulatory Visit: Payer: Medicare HMO | Admitting: Internal Medicine

## 2022-04-20 ENCOUNTER — Ambulatory Visit: Payer: Medicare HMO | Admitting: Internal Medicine

## 2022-04-20 ENCOUNTER — Telehealth: Payer: Self-pay

## 2022-04-20 ENCOUNTER — Other Ambulatory Visit: Payer: Self-pay | Admitting: Internal Medicine

## 2022-04-20 DIAGNOSIS — I480 Paroxysmal atrial fibrillation: Secondary | ICD-10-CM

## 2022-04-20 NOTE — Telephone Encounter (Signed)
Pt called in to cancel her appt. Woke up with fever, cough and sore throat. She has been advised to take a covid test. She will. She states that she doesn't need to be seen now but if she is positive or anything changes she will call back. Her grandkids have been sick.

## 2022-04-28 DIAGNOSIS — F3342 Major depressive disorder, recurrent, in full remission: Secondary | ICD-10-CM | POA: Diagnosis not present

## 2022-04-28 DIAGNOSIS — F411 Generalized anxiety disorder: Secondary | ICD-10-CM | POA: Diagnosis not present

## 2022-05-01 DIAGNOSIS — M8589 Other specified disorders of bone density and structure, multiple sites: Secondary | ICD-10-CM | POA: Diagnosis not present

## 2022-05-01 DIAGNOSIS — Z1231 Encounter for screening mammogram for malignant neoplasm of breast: Secondary | ICD-10-CM | POA: Diagnosis not present

## 2022-05-01 LAB — HM DEXA SCAN

## 2022-05-01 LAB — HM MAMMOGRAPHY

## 2022-05-04 ENCOUNTER — Encounter: Payer: Self-pay | Admitting: Internal Medicine

## 2022-05-04 ENCOUNTER — Ambulatory Visit (INDEPENDENT_AMBULATORY_CARE_PROVIDER_SITE_OTHER): Payer: Medicare HMO | Admitting: Internal Medicine

## 2022-05-04 ENCOUNTER — Telehealth: Payer: Self-pay | Admitting: Internal Medicine

## 2022-05-04 VITALS — BP 130/76 | HR 71 | Temp 98.0°F | Wt 190.0 lb

## 2022-05-04 DIAGNOSIS — Z23 Encounter for immunization: Secondary | ICD-10-CM

## 2022-05-04 DIAGNOSIS — F418 Other specified anxiety disorders: Secondary | ICD-10-CM

## 2022-05-04 DIAGNOSIS — Z7185 Encounter for immunization safety counseling: Secondary | ICD-10-CM | POA: Diagnosis not present

## 2022-05-04 MED ORDER — AZITHROMYCIN 250 MG PO TABS: ORAL_TABLET | ORAL | 0 refills | Status: AC

## 2022-05-04 NOTE — Telephone Encounter (Signed)
Spoke with pharmacy staff and was told they contacted the patient for clarification on Rx and Rx was filled

## 2022-05-04 NOTE — Progress Notes (Signed)
   Subjective:    Patient ID: Olivia Reynolds, female    DOB: 03-Feb-1951, 71 y.o.   MRN: [id number]  HPI 71 year old Female seen for 80-monthrecheck.  She has had considerable issues with her eyes over the past several months.  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  It has taken some time to recover.  Continues with issues with esophageal dysphagia and is followed by Dr. GCarlean Purl  History of severe GE reflux.  She is on Protonix 40 mg twice daily.  History of paroxysmal atrial fibrillation on chronic anticoagulation therapy.  History of depression and has seen LNoemi Chapel  Currently on Wellbutrin and Restoril.  She is on Crestor 20 mg daily per cardiology for hyperlipidemia.  Also on metoprolol XL 25 mg 1-1/2 tablets by mouth daily per Dr. CStanford Breed  Social history: She is divorced.  She has grandchildren.  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  Non-smoker.  Social alcohol consumption.  Review of Systems see above-situational stress having gone through 2 eye surgeries.  She is thinking about returning to the workforce if the right situation presented itself.     Objective:   Physical Exam Blood pressure 130/76 pulse 71 temperature 98 degrees pulse oximetry 97% weight 190 pounds BMI 32.61  Skin: Warm and dry.  No cervical adenopathy.  No carotid bruits.  No thyromegaly.  Chest is clear.  Cardiac exam: Rhythm consistent with atrial fib- rate controlled.  Extremities without pitting edema.     Assessment & Plan:  History of paroxysmal atrial fib on chronic anticoagulation and treated with metoprolol per Dr. CStanford Breed Hyperlipidemia treated with Crestor 20 mg daily per Dr. CStanford Breed Severe GE reflux treated with Remeron 15 mg at bedtime per Dr. GCarlean Purland generic Protonix 40 mg twice daily.  [facility] Situational stress  and anxiety treated with Wellbutrin  History of insomnia treated with temazepam if needed  Plan: Immunizations reviewed.  Pneumococcal 20 vaccine given.  She should consider COVID booster this Fall when new variant vaccine is available.  Will need annual flu vaccine this Fall as well.  Has had her Shingrix series.  Tetanus immunization is due in October and can be obtained at local pharmacy since she is on Medicare.  Return in December for Medicare wellness visit.

## 2022-05-04 NOTE — Telephone Encounter (Signed)
[facility] called questioning allergies to below medication.  azithromycin (ZITHROMAX) 250 MG tablet

## 2022-05-21 ENCOUNTER — Other Ambulatory Visit: Payer: Self-pay | Admitting: Adult Health

## 2022-05-21 ENCOUNTER — Other Ambulatory Visit: Payer: Self-pay | Admitting: Cardiology

## 2022-05-21 DIAGNOSIS — I480 Paroxysmal atrial fibrillation: Secondary | ICD-10-CM

## 2022-05-22 ENCOUNTER — Encounter: Payer: Self-pay | Admitting: Cardiology

## 2022-05-22 NOTE — Telephone Encounter (Signed)
Prescription refill request for Eliquis received. Indication:Afib Last office visit:1/23 Scr:0.7 Age: 71 Weight:86.2 kg  Prescription refilled

## 2022-06-07 NOTE — Patient Instructions (Addendum)
It was a pleasure to see you today.  Pneumococcal 20 vaccine given.  Please have tetanus updated at local pharmacy.  Consider COVID booster this fall when new vaccine is available.  Return in December for Medicare wellness visit and annual health maintenance exam.  Continue close follow-up with Gastroenterology and Cardiology.  For respiratory infection symptoms have provided Zithromax Z-PAK at her request.

## 2022-07-02 ENCOUNTER — Encounter: Payer: Self-pay | Admitting: Cardiology

## 2022-07-10 ENCOUNTER — Telehealth: Payer: Self-pay | Admitting: Cardiology

## 2022-07-10 ENCOUNTER — Telehealth: Payer: Self-pay

## 2022-07-10 DIAGNOSIS — M5416 Radiculopathy, lumbar region: Secondary | ICD-10-CM | POA: Diagnosis not present

## 2022-07-10 DIAGNOSIS — Z01818 Encounter for other preprocedural examination: Secondary | ICD-10-CM

## 2022-07-10 DIAGNOSIS — I480 Paroxysmal atrial fibrillation: Secondary | ICD-10-CM

## 2022-07-10 NOTE — Telephone Encounter (Signed)
   Pre-operative Risk Assessment    Patient Name: Olivia Reynolds  DOB: [birth year] MRN: [id number]      Request for Surgical Clearance    Procedure:   Lumbar spine epidural injection  Date of Surgery:  Clearance 07/20/22                                 Surgeon:  Dr. Ardyth Man Group or Practice Name:  [facility] Phone number:  [phone] Fax number:  [phone]   Type of Clearance Requested:   - Pharmacy:  Hold Apixaban (Eliquis)     Type of Anesthesia:   Not indicated   Additional requests/questions:   Caller requested patient's Eliquis be held 3 days prior to procedure.  Signed, Heloise Beecham   07/10/2022, 10:45 AM

## 2022-07-10 NOTE — Telephone Encounter (Signed)
Patient with diagnosis of A Fib on Eliquis for anticoagulation.    Procedure:  Lumbar spine epidural injection   Date of procedure: 07/20/22   CHA2DS2-VASc Score = 5  his indicates a 7.2% annual risk of stroke. The patient's score is based upon: CHF History: 1 HTN History: 1 Diabetes History: 0 Stroke History: 0 Vascular Disease History: 1 Age Score: 1 Gender Score: 1  CrCl 77 mL/min Platelet count Overdue  Recommend patient update CBC before procedure  Per office protocol, patient can hold Eliquis for 3 days prior to procedure.     **This guidance is not considered finalized until pre-operative APP has relayed final recommendations.**

## 2022-07-10 NOTE — Telephone Encounter (Signed)
ERROR

## 2022-07-13 DIAGNOSIS — Z01818 Encounter for other preprocedural examination: Secondary | ICD-10-CM | POA: Diagnosis not present

## 2022-07-13 DIAGNOSIS — I480 Paroxysmal atrial fibrillation: Secondary | ICD-10-CM | POA: Diagnosis not present

## 2022-07-13 NOTE — Telephone Encounter (Signed)
Follow Up:    Olivia Reynolds is calling back to check on the status of patient's clearance. Please fax asap to [phone].

## 2022-07-13 NOTE — Telephone Encounter (Signed)
Left message for the pt that she will need CBC before she can be cleared. I will place lab order to be done at [facility]. Left message for the pt if she can get lab today or tomorrow at the latest so that we may have time be sure the hold medication in time for her procedure with Dr. Davy Pique.

## 2022-07-13 NOTE — Telephone Encounter (Signed)
I called Olivia Reynolds with Dr. Davy Pique office back and informed her that the pt is needing lab work to be done before she can be cleared.

## 2022-07-14 LAB — CBC
Hematocrit: 38.8 % (ref 34.0–46.6)
Hemoglobin: 13.2 g/dL (ref 11.1–15.9)
MCH: 30.5 pg (ref 26.6–33.0)
MCHC: 34 g/dL (ref 31.5–35.7)
MCV: 90 fL (ref 79–97)
Platelets: 317 10*3/uL (ref 150–450)
RBC: 4.33 x10E6/uL (ref 3.77–5.28)
RDW: 12.3 % (ref 11.7–15.4)
WBC: 6.9 10*3/uL (ref 3.4–10.8)

## 2022-07-14 NOTE — Telephone Encounter (Signed)
   Patient Name: Olivia Reynolds  DOB: 1951/08/24 MRN: [id number]  Primary Cardiologist: Kirk Ruths, MD  Clinical pharmacists have reviewed the patient's past medical history, labs, and current medications as part of preoperative protocol coverage. The following recommendations have been made:  Patient with diagnosis of A Fib on Eliquis for anticoagulation.     Procedure:  Lumbar spine epidural injection   Date of procedure: 07/20/22     CHA2DS2-VASc Score = 5  his indicates a 7.2% annual risk of stroke. The patient's score is based upon: CHF History: 1 HTN History: 1 Diabetes History: 0 Stroke History: 0 Vascular Disease History: 1 Age Score: 1 Gender Score: 1   CrCl 77 mL/min Platelet count 317K     Per office protocol, patient can hold Eliquis for 3 days prior to procedure.  Please resume Eliquis as soon as possible postprocedure, at the discretion of the surgeon.     I will route this recommendation to the requesting party via Epic fax function and remove from pre-op pool.  Please call with questions.  Lenna Sciara, NP 07/14/2022, 8:16 AM

## 2022-07-14 NOTE — Telephone Encounter (Signed)
Left message for the pt she has been cleared and she will need to hold her Eliquis x 3 days prior to procedure. Notes have been faxed to Dr. Davy Pique office.

## 2022-07-14 NOTE — Telephone Encounter (Signed)
Patient with diagnosis of A Fib on Eliquis for anticoagulation.     Procedure:  Lumbar spine epidural injection   Date of procedure: 07/20/22     CHA2DS2-VASc Score = 5  his indicates a 7.2% annual risk of stroke. The patient's score is based upon: CHF History: 1 HTN History: 1 Diabetes History: 0 Stroke History: 0 Vascular Disease History: 1 Age Score: 1 Gender Score: 1   CrCl 77 mL/min Platelet count 317K     Per office protocol, patient can hold Eliquis for 3 days prior to procedure.       **This guidance is not considered finalized until pre-operative APP has relayed final recommendations.**

## 2022-07-20 DIAGNOSIS — M5416 Radiculopathy, lumbar region: Secondary | ICD-10-CM | POA: Diagnosis not present

## 2022-07-28 ENCOUNTER — Other Ambulatory Visit: Payer: Self-pay

## 2022-07-28 ENCOUNTER — Ambulatory Visit (INDEPENDENT_AMBULATORY_CARE_PROVIDER_SITE_OTHER): Payer: Medicare HMO | Admitting: Internal Medicine

## 2022-07-28 ENCOUNTER — Encounter: Payer: Self-pay | Admitting: Internal Medicine

## 2022-07-28 ENCOUNTER — Telehealth: Payer: Self-pay | Admitting: Internal Medicine

## 2022-07-28 VITALS — BP 116/70 | HR 69 | Temp 98.8°F | Resp 98

## 2022-07-28 DIAGNOSIS — J3489 Other specified disorders of nose and nasal sinuses: Secondary | ICD-10-CM

## 2022-07-28 DIAGNOSIS — R058 Other specified cough: Secondary | ICD-10-CM

## 2022-07-28 DIAGNOSIS — R069 Unspecified abnormalities of breathing: Secondary | ICD-10-CM | POA: Diagnosis not present

## 2022-07-28 MED ORDER — HYDROCODONE BIT-HOMATROP MBR 5-1.5 MG/5ML PO SOLN: 5.0000 mL | Freq: Three times a day (TID) | ORAL | 0 refills | Status: DC | PRN

## 2022-07-28 MED ORDER — LEVOFLOXACIN 500 MG PO TABS: 500.0000 mg | ORAL_TABLET | Freq: Every day | ORAL | 0 refills | Status: AC

## 2022-07-28 NOTE — Progress Notes (Signed)
   Subjective:    Patient ID: Olivia Reynolds, female    DOB: September 30, 1950, 71 y.o.   MRN: [id number]  HPI Patient is home schooling her grandson and was around a group of children at a party recently. She has also had a recent epidural steroid infection and [facility] by Dr. Davy Pique.  A few days ago, patient developed respiratory congestion and some headache.  She has had some cough, postnasal drip and some pain under her rib cage below her breast area.  She did 2 COVID tests-- 1 on Saturday and 1 yesterday both of which were negative.     Review of Systems no nausea, vomiting, shaking chills     Objective:   Physical Exam Blood pressure 116/70, pulse 69 , temperature 98.8 degrees  Skin: Warm and dry.  TMs not red, may be slightly full bilaterally.  Pharynx is slightly injected without exudate.  Neck is supple.  Chest is clear to auscultation without rales or wheezing.  Respiratory virus panel obtained and results are pending     Assessment & Plan:  Acute lower respiratory infection  Plan: Levaquin 500 mg daily by mouth for 7 days.  Hycodan 1 teaspoon every 8 hours as needed for cough.  Respiratory virus panel obtained.  Rest and drink plenty of fluids.  Call if not improving in a few days or sooner if worse.

## 2022-07-28 NOTE — Patient Instructions (Signed)
Respiratory virus panel obtained.  You have been diagnosed with acute lower respiratory infection.  Please take Levaquin 500 mg daily for 7 days.  May take Hycodan 1 teaspoon every 8 hours as needed for cough.  Rest and drink plenty of fluids.  Call if not improving in a few days or sooner if worse.

## 2022-07-28 NOTE — Addendum Note (Signed)
Addended by: Geradine Girt D on: 07/28/2022 12:15 PM   Modules accepted: Orders

## 2022-07-28 NOTE — Telephone Encounter (Signed)
Olivia Reynolds [phone]  [name] called to say she has had dry cough, sinus drainage, some labored breathing and now pain under rib cage, below breast area for about a week, she done a COVID test on Saturday and yesterday that was negative. I scheduled her to come in at 11:30 today.

## 2022-07-30 LAB — RESPIRATORY VIRUS PANEL
Adenovirus B: NOT DETECTED
HUMAN PARAINFLU VIRUS 1: NOT DETECTED
HUMAN PARAINFLU VIRUS 2: NOT DETECTED
HUMAN PARAINFLU VIRUS 3: DETECTED — AB
INFLUENZA A SUBTYPE H1: NOT DETECTED
INFLUENZA A SUBTYPE H3: NOT DETECTED
Influenza A: NOT DETECTED
Influenza B: NOT DETECTED
Metapneumovirus: NOT DETECTED
Respiratory Syncytial Virus A: NOT DETECTED
Respiratory Syncytial Virus B: NOT DETECTED
Rhinovirus: NOT DETECTED

## 2022-09-01 ENCOUNTER — Other Ambulatory Visit: Payer: Medicare HMO

## 2022-09-01 DIAGNOSIS — I10 Essential (primary) hypertension: Secondary | ICD-10-CM

## 2022-09-01 DIAGNOSIS — E785 Hyperlipidemia, unspecified: Secondary | ICD-10-CM

## 2022-09-01 DIAGNOSIS — R5383 Other fatigue: Secondary | ICD-10-CM

## 2022-09-07 ENCOUNTER — Ambulatory Visit (INDEPENDENT_AMBULATORY_CARE_PROVIDER_SITE_OTHER): Payer: Medicare HMO | Admitting: Internal Medicine

## 2022-09-07 ENCOUNTER — Telehealth: Payer: Self-pay | Admitting: Internal Medicine

## 2022-09-07 ENCOUNTER — Ambulatory Visit: Payer: Medicare HMO | Admitting: Internal Medicine

## 2022-09-07 VITALS — BP 115/55 | HR 67 | Temp 100.5°F | Wt 187.0 lb

## 2022-09-07 DIAGNOSIS — U071 COVID-19: Secondary | ICD-10-CM

## 2022-09-07 MED ORDER — HYDROCODONE BIT-HOMATROP MBR 5-1.5 MG/5ML PO SOLN: 5.0000 mL | Freq: Three times a day (TID) | ORAL | 0 refills | Status: DC | PRN

## 2022-09-07 MED ORDER — AZITHROMYCIN 250 MG PO TABS: ORAL_TABLET | ORAL | 0 refills | Status: AC

## 2022-09-07 NOTE — Progress Notes (Signed)
   Subjective:    Patient ID: Olivia Reynolds, female    DOB: Jan 31, 1951, 71 y.o.   MRN: [id number]  HPI 71 year old Female seen today by interactive audio and video telecommunications.  Patient is identified using 2 identifiers as Olivia Reynolds, a longstanding patient in this practice.  She is at her home, and I am at my office.  She is agreeable to visit in this format today.  Patient has tested positive for COVID-19.  She developed symptoms over the weekend with cough, headache, temperature of 101.5 degrees, malaise and fatigue.  Records indicate no COVID booster recently.  She had planned on getting a booster but will need to defer this now for at least a couple of months.  Patient has a history of GE reflux, hyperlipidemia, paroxysmal atrial fibrillation maintained on Eliquis.  Also history of depression and lumbar radiculopathy.  She does not smoke.  Social alcohol consumption.  She is divorced and resides alone.    Review of Systems she denies shortness of breath, chest pain, nausea or vomiting.     Objective:   Physical Exam She is seen virtually today and is able to give a clear concise history.  She is not heard to be wheezing and does not seem to be extremely short of breath but looks fatigued and slightly pale.       Assessment & Plan:   Acute COVID-19 virus infection-first time getting the infection  Plan: Options for treatment were discussed including Paxlovid.  Patient prefers other options.  I am sending in a Zithromax Z-PAK to take 2 tabs day 1 followed by 1 tab days 2 through 5 and Hycodan 1 teaspoon every 6-8 hours as needed for cough.  She is to rest and stay well-hydrated.  She is to walk some to prevent atelectasis about her hide.  She has been advised to quarantine for 5 days.  She is to wait 2 months to be revaccinated for COVID-19.

## 2022-09-07 NOTE — Telephone Encounter (Signed)
Olivia Reynolds [phone]  Olivia Reynolds called to say she tested positive for COVID yesterday, she has really bad headache, body aches, fever 101.5, neck pain, runny nose, sneezing, tight in right side and weakness. Scheduled for video vist at 4:00 today

## 2022-09-07 NOTE — Patient Instructions (Signed)
I am sorry you are not feeling well today.  Please quarantine for 5 days.  Monitor for shortness of breath.  Walk around her home some to prevent atelectasis of the lungs.  Stay well-hydrated.  Take Zithromax Z-PAK as prescribed and Hycodan sparingly for cough.  Consider a pulse ox device to monitor your oxygen level.  Please call if symptoms worsen.  You may be revaccinated for COVID-19 in 2 months.

## 2022-09-09 ENCOUNTER — Ambulatory Visit (INDEPENDENT_AMBULATORY_CARE_PROVIDER_SITE_OTHER): Payer: Medicare HMO | Admitting: Internal Medicine

## 2022-09-09 ENCOUNTER — Ambulatory Visit: Payer: Medicare HMO

## 2022-09-09 ENCOUNTER — Telehealth: Payer: Self-pay

## 2022-09-09 ENCOUNTER — Encounter: Payer: Self-pay | Admitting: Internal Medicine

## 2022-09-09 ENCOUNTER — Telehealth: Payer: Self-pay | Admitting: Internal Medicine

## 2022-09-09 VITALS — Wt 187.0 lb

## 2022-09-09 DIAGNOSIS — Z0000 Encounter for general adult medical examination without abnormal findings: Secondary | ICD-10-CM

## 2022-09-09 DIAGNOSIS — U071 COVID-19: Secondary | ICD-10-CM

## 2022-09-09 NOTE — Progress Notes (Deleted)
    Subjective:    Patient ID: Olivia Reynolds , female    DOB: [birth year], 71 y.o.    MRN: [id number]   71 y.o. female presents today for : Chief Complaint  Patient presents with   Medicare Wellness     HPI     ROS      Objective:   There were no vitals filed for this visit.   Physical Exam       Assessment & Plan:      Pearlean Brownie, CMA

## 2022-09-09 NOTE — Progress Notes (Deleted)
Annual Wellness Visit     Patient: Olivia Reynolds, Female    DOB: [birth year], 71 y.o.   MRN: [id number] Visit Date: 09/09/2022  Chief Complaint  Patient presents with   Medicare Wellness   Subjective    Olivia Reynolds is a 71 y.o. female who presents today for her Annual Wellness Visit.  HPI   Social History   Social History Narrative   Divorced, 3 children lives alone   She is a retired Musician   Former smoker no alcohol no drug use    Patient Care Team: Baxley, Cresenciano Lick, MD as PCP - General (Internal Medicine) Stanford Breed, Denice Bors, MD as PCP - Cardiology (Cardiology) Stanford Breed Denice Bors, MD as Consulting Physician (Cardiology)  Review of Systems   Objective    Vitals: There were no vitals taken for this visit.  Physical Exam   Most recent functional status assessment:    09/09/2022   12:58 PM  In your present state of health, do you have any difficulty performing the following activities:  Hearing? 0  Vision? 0  Difficulty concentrating or making decisions? 0  Walking or climbing stairs? 0  Dressing or bathing? 0  Doing errands, shopping? 0  Preparing Food and eating ? N  Using the Toilet? N  In the past six months, have you accidently leaked urine? N  Do you have problems with loss of bowel control? N  Managing your Medications? N  Managing your Finances? N  Housekeeping or managing your Housekeeping? N   Most recent fall risk assessment:    09/09/2022   12:57 PM  [place] in the past year? 0  Number falls in past yr: 0  Injury with Fall? 0  Risk for fall due to : No Fall Risks  Follow up Falls prevention discussed    Most recent depression screenings:    09/09/2022   12:58 PM 09/07/2022    4:02 PM  PHQ 2/9 Scores  PHQ - 2 Score 0 0   Most recent cognitive screening:    09/09/2022   12:59 PM  6CIT Screen  What Year? 0 points  What month? 0 points  What time? 0 points  Count back  from 20 0 points  Months in reverse 0 points       Assessment & Plan     Annual wellness visit done today including the all of the following: Reviewed patient's Family Medical History Reviewed and updated list of patient's medical providers Assessment of cognitive impairment was done Assessed patient's functional ability Established a written schedule for health screening [place] Completed and Reviewed  Discussed health benefits of physical activity, and encouraged her to engage in regular exercise appropriate for her age and condition.            Adeoluwa Silvers D Elidia Bonenfant, CMA   Subjective:   Olivia Reynolds is a 71 y.o. female who presents for Medicare Annual (Subsequent) preventive examination.  Review of Systems    Cardiac Risk Factors include: advanced age (>80mn, >>61women)     Objective:    There were no vitals filed for this visit. There is no height or weight on file to calculate BMI.     09/09/2022   12:58 PM 08/11/2021    3:18 PM 08/14/2018   12:04 AM 06/18/2016    8:25 PM  Advanced Directives  Does Patient Have a Medical Advance Directive? No  No No No  Would patient like information on creating a medical advance directive? No - Patient declined No - Patient declined  No - patient declined information    Current Medications (verified) Outpatient Encounter Medications as of 09/09/2022  Medication Sig   acetaminophen (TYLENOL) 500 MG tablet Take 500 mg by mouth every 6 (six) hours as needed.   azithromycin (ZITHROMAX) 250 MG tablet Take 2 tablets on day 1, then 1 tablet daily on days 2 through 5   buPROPion (WELLBUTRIN XL) 300 MG 24 hr tablet Take 300 mg by mouth every morning.   ELIQUIS 5 MG TABS tablet TAKE 1 TABLET(5 MG) BY MOUTH TWICE DAILY   HYDROcodone bit-homatropine (HYCODAN) 5-1.5 MG/5ML syrup Take 5 mLs by mouth every 8 (eight) hours as needed for cough.   metoprolol succinate (TOPROL-XL) 25 MG 24 hr tablet TAKE 1 AND 1/2  TABLETS(37.5 MG) BY MOUTH DAILY   mirtazapine (REMERON) 15 MG tablet TAKE 1 TABLET(15 MG) BY MOUTH AT BEDTIME   pantoprazole (PROTONIX) 40 MG tablet TAKE 1 TABLET(40 MG) BY MOUTH TWICE DAILY   rosuvastatin (CRESTOR) 20 MG tablet TAKE 1 TABLET BY MOUTH EVERY DAY   temazepam (RESTORIL) 15 MG capsule Take 15 mg by mouth at bedtime as needed.   No facility-administered encounter medications on file as of 09/09/2022.    Allergies (verified) Erythromycin, Statins, Codeine, Hctz [hydrochlorothiazide], and Lipitor [atorvastatin]   History: Past Medical History:  Diagnosis Date   Chronic headaches    Depression    Diastolic dysfunction    a. 12/2013 Echo: EF 60-65%, Gr1 DD, Ao sclerosis w/o stenosis, triv MR, mod dil LA, mild TR.   Diverticulosis    GERD (gastroesophageal reflux disease)    History of hiatal hernia    with repair   History of stress test    a. 10/2008 MV: Ef 75%, nl perfusion.   HTN (hypertension)    Hyperlipidemia    Migraine    Obesity    Osteopenia    PAF (paroxysmal atrial fibrillation) ([place])    a. Anticoagulated w/ Eliquis (CHA2DS2VASc = 3).   Past Surgical History:  Procedure Laterality Date   CATARACT EXTRACTION, BILATERAL     COLONOSCOPY     ELBOW SURGERY  left   Tendon Surgery   ESOPHAGEAL MANOMETRY N/A 02/23/2018   Procedure: ESOPHAGEAL MANOMETRY (EM);  Surgeon: Mauri Pole, MD;  Location: [facility];  Service: Endoscopy;  Laterality: N/A;   EYE SURGERY Left    L-yey myopia   HERNIA REPAIR  1829   nissan fundiplication   KNEE ARTHROSCOPY  2005   R-knee   LAPAROSCOPIC NISSEN FUNDOPLICATION     ROTATOR CUFF REPAIR Right    TONSILLECTOMY     TUBAL LIGATION     UPPER GASTROINTESTINAL ENDOSCOPY     Family History  Problem Relation Age of Onset   COPD Mother    Asthma Mother    Arthritis Father    Hypertension Father    Stroke Father    Cancer Father    Prostate cancer Father    Hypertension Sister    Diabetes Sister    Hypertension  Brother    Cancer Sister        UTERINE   Cancer Son        TESTICULAR   Crohn's disease Daughter    Pancreatic cancer Other    Colon cancer Cousin    Esophageal cancer Neg Hx    Stomach cancer Neg Hx    Liver  disease Neg Hx    Rectal cancer Neg Hx    Social History   Socioeconomic History   Marital status: Divorced    Spouse name: Not on file   Number of children: 3   Years of education: Not on file   Highest education level: Not on file  Occupational History   Occupation: [employer]    Comment: retired  Tobacco Use   Smoking status: Former    Types: Cigarettes    Quit date: 09/07/1971    Years since quitting: 51.0   Smokeless tobacco: Never  Vaping Use   Vaping Use: Never used  Substance and Sexual Activity   Alcohol use: Not Currently   Drug use: No   Sexual activity: Not on file  Other Topics Concern   Not on file  Social History Narrative   Divorced, 3 children lives alone   She is a retired Development worker, [employer], adult   Former smoker no alcohol no drug use   Social Determinants of Radio broadcast assistant Strain: Not on file  Food Insecurity: Not on file  Transportation Needs: Not on file  Physical Activity: Not on file  Stress: Not on file  Social Connections: Not on file    Tobacco Counseling Counseling given: Not Answered   Clinical Intake:                 Diabetic?         Activities of Daily Living    09/09/2022   12:58 PM  In your present state of health, do you have any difficulty performing the following activities:  Hearing? 0  Vision? 0  Difficulty concentrating or making decisions? 0  Walking or climbing stairs? 0  Dressing or bathing? 0  Doing errands, shopping? 0  Preparing Food and eating ? N  Using the Toilet? N  In the past six months, have you accidently leaked urine? N  Do you have problems with loss of bowel control? N  Managing your Medications? N   Managing your Finances? N  Housekeeping or managing your Housekeeping? N    Patient Care Team: Elby Showers, MD as PCP - General (Internal Medicine) Stanford Breed Denice Bors, MD as PCP - Cardiology (Cardiology) Stanford Breed Denice Bors, MD as Consulting Physician (Cardiology)  Indicate any recent Medical Services you may have received from other than [facility] providers in the past year (date may be approximate).     Assessment:   This is a routine wellness examination for Olivia Reynolds.  Hearing/Vision screen No results found.  Dietary issues and exercise activities discussed: Current Exercise Habits: Home exercise routine, Type of exercise: Other - see comments, Time (Minutes): 20, Frequency (Times/Week): 2, Weekly Exercise (Minutes/Week): 40, Intensity: Mild, Exercise limited by: Other - see comments   Goals Addressed   None   Depression Screen    09/09/2022   12:58 PM 09/07/2022    4:02 PM 07/28/2022   11:54 AM 08/11/2021    3:18 PM 08/08/2020    2:16 PM 10/06/2018    3:08 PM 04/14/2016   10:22 AM  PHQ 2/9 Scores  PHQ - 2 Score 0 0 0 0 0 0 0    Fall Risk    09/09/2022   12:57 PM 09/07/2022    4:02 PM 07/28/2022   11:54 AM 08/11/2021    3:18 PM 08/08/2020    2:16 PM  Fall Risk   Falls in the past year? 0 0 0  0 1  Number falls in past yr: 0 0 0 0 1  Injury with Fall? 0 0 0  0  Risk for fall due to : No Fall Risks No Fall Risks No Fall Risks No Fall Risks   Follow up Falls prevention discussed Falls prevention discussed Falls evaluation completed Falls evaluation completed Falls evaluation completed    [place]:  Any stairs in or around the home?  If so, are there any without handrails?  Home free of loose throw rugs in walkways, pet beds, electrical cords, etc?  Adequate lighting in your home to reduce risk of falls?   ASSISTIVE DEVICES UTILIZED TO PREVENT FALLS:  Life alert?  Use of a cane, walker or w/c?  Grab bars in the bathroom?  Shower  chair or bench in shower?  Elevated toilet seat or a handicapped toilet?   TIMED UP AND GO:  Was the test performed?  Length of time to ambulate 10 feet:     Cognitive Function:        09/09/2022   12:59 PM 08/11/2021    3:20 PM  6CIT Screen  What Year? 0 points 0 points  What month? 0 points 0 points  What time? 0 points 0 points  Count back from 20 0 points 0 points  Months in reverse 0 points 0 points  Repeat phrase  0 points  Total Score  0 points    Immunizations Immunization History  Administered Date(s) Administered   Fluad Quad(high Dose 65+) 07/18/2019   Influenza Split 07/18/2012   Influenza,inj,Quad PF,6+ Mos 08/15/2013, 09/12/2018, 08/11/2021   PFIZER(Purple Top)SARS-COV-2 Vaccination 11/03/2019, 11/28/2019, 08/06/2020   PNEUMOCOCCAL CONJUGATE-20 05/04/2022   Pneumococcal Polysaccharide-23 07/09/2020   Td 04/22/1999   Tdap 07/18/2012    TDAP status: Due, Education has been provided regarding the importance of this vaccine. Advised may receive this vaccine at local pharmacy or Health Dept. Aware to provide a copy of the vaccination record if obtained from local pharmacy or Health Dept. Verbalized acceptance and understanding.  Flu Vaccine status: Due, Education has been provided regarding the importance of this vaccine. Advised may receive this vaccine at local pharmacy or Health Dept. Aware to provide a copy of the vaccination record if obtained from local pharmacy or Health Dept. Verbalized acceptance and understanding.  Pneumococcal vaccine status: Up to date  Covid-19 vaccine status: Information provided on how to obtain vaccines.   Qualifies for Shingles Vaccine? No   Zostavax completed No   Shingrix Completed?: No.    Education has been provided regarding the importance of this vaccine. Patient has been advised to call insurance company to determine out of pocket expense if they have not yet received this vaccine. Advised may also receive vaccine at  local pharmacy or Health Dept. Verbalized acceptance and understanding.  Screening Tests Health Maintenance  Topic Date Due   DTaP/Tdap/Td (3 - Td or Tdap) 07/18/2022   INFLUENZA VACCINE  03/09/2023 (Originally 04/28/2022)   Medicare Annual Wellness (AWV)  09/10/2023   MAMMOGRAM  05/01/2024   COLONOSCOPY (Pts 45-44yr Insurance coverage will need to be confirmed)  05/16/2024   Pneumonia Vaccine 71 Years old  Completed   DEXA SCAN  Completed   HPV VACCINES  Aged Out   COVID-19 Vaccine  Discontinued   Hepatitis C Screening  Discontinued   Zoster Vaccines- Shingrix  Discontinued    Health Maintenance  Health Maintenance Due  Topic Date Due   DTaP/Tdap/Td (3 - Td or  Tdap) 07/18/2022    Colorectal cancer screening: Type of screening: Colonoscopy. Completed 05/17/19. Repeat every 5 years  Mammogram status: Completed 05/01/22. Repeat every year 2  Bone Density status: Completed 05/01/22. Results reflect: Bone density results: OSTEOPOROSIS. Repeat every 1 years.  Lung Cancer Screening: (Low Dose CT Chest recommended if Age 22-80 years, 30 pack-year currently smoking OR have quit w/in 15years.) does not qualify.   Lung Cancer Screening Referral: N/A  Additional Screening:  Hepatitis C Screening: does not qualify; Completed N/A  Vision Screening: Recommended annual ophthalmology exams for early detection of glaucoma and other disorders of the eye. Is the patient up to date with their annual eye exam?  No  Who is the provider or what is the name of the office in which the patient attends annual eye exams?  If pt is not established with a provider, would they like to be referred to a provider to establish care? No .   Dental Screening: Recommended annual dental exams for proper oral hygiene  Community Resource Referral / Chronic Care Management: CRR required this visit?  No   CCM required this visit?  No      Plan:     I have personally reviewed and noted the following in the  patient's chart:   Medical and social history Use of alcohol, tobacco or illicit drugs  Current medications and supplements including opioid prescriptions. Patient is not currently taking opioid prescriptions. Functional ability and status Nutritional status Physical activity Advanced directives List of other physicians Hospitalizations, surgeries, and ER visits in previous 12 months Vitals Screenings to include cognitive, depression, and falls Referrals and appointments  In addition, I have reviewed and discussed with patient certain preventive protocols, quality metrics, and best practice recommendations. A written personalized care plan for preventive services as well as general preventive health recommendations were provided to patient.     Pearlean Brownie, [place]   09/09/2022   Nurse Notes:

## 2022-09-09 NOTE — Telephone Encounter (Signed)
Patient called stating she does not feel well and would like to reconsider taking Paxlovid if its not to late and will do whatever is recommended also canceled AWV

## 2022-09-09 NOTE — Progress Notes (Deleted)
    Subjective:    Patient ID: Olivia Reynolds , female    DOB: 1951-01-18, 71 y.o.    MRN: [id number]   71 y.o. female presents today for : Chief Complaint  Patient presents with   Medicare Wellness     HPI     ROS      Objective:   There were no vitals filed for this visit.   Physical Exam       Assessment & Plan:      Pearlean Brownie, CMA

## 2022-09-09 NOTE — Progress Notes (Deleted)
Subjective:    Patient ID: Olivia Reynolds , female    DOB: 03-12-1951, 71 y.o.    MRN: [id number]   71 y.o. female presents today for : Chief Complaint  Patient presents with   Medicare Wellness     HPI     ROS      Objective:   There were no vitals filed for this visit.   Physical Exam       Assessment & Plan:      Temisha Murley Barron Alvine, CMA      Subjective:   Olivia Reynolds is a 71 y.o. female who presents for Medicare Annual (Subsequent) preventive examination.  Review of Systems     Cardiac Risk Factors include: advanced age (>62mn, >>48women)     Objective:    Today's Vitals   09/09/22 1429  Weight: 187 lb (84.8 kg)   Body mass index is 32.1 kg/m.     09/09/2022   12:58 PM 08/11/2021    3:18 PM 08/14/2018   12:04 AM 06/18/2016    8:25 PM  Advanced Directives  Does Patient Have a Medical Advance Directive? No No No No  Would patient like information on creating a medical advance directive? No - Patient declined No - Patient declined  No - patient declined information    Current Medications (verified) Outpatient Encounter Medications as of 09/09/2022  Medication Sig   acetaminophen (TYLENOL) 500 MG tablet Take 500 mg by mouth every 6 (six) hours as needed.   azithromycin (ZITHROMAX) 250 MG tablet Take 2 tablets on day 1, then 1 tablet daily on days 2 through 5   buPROPion (WELLBUTRIN XL) 300 MG 24 hr tablet Take 300 mg by mouth every morning.   ELIQUIS 5 MG TABS tablet TAKE 1 TABLET(5 MG) BY MOUTH TWICE DAILY   HYDROcodone bit-homatropine (HYCODAN) 5-1.5 MG/5ML syrup Take 5 mLs by mouth every 8 (eight) hours as needed for cough.   metoprolol succinate (TOPROL-XL) 25 MG 24 hr tablet TAKE 1 AND 1/2 TABLETS(37.5 MG) BY MOUTH DAILY   mirtazapine (REMERON) 15 MG tablet TAKE 1 TABLET(15 MG) BY MOUTH AT BEDTIME   pantoprazole (PROTONIX) 40 MG tablet TAKE 1 TABLET(40 MG) BY MOUTH TWICE DAILY   rosuvastatin (CRESTOR) 20 MG tablet TAKE 1 TABLET BY  MOUTH EVERY DAY   temazepam (RESTORIL) 15 MG capsule Take 15 mg by mouth at bedtime as needed.   No facility-administered encounter medications on file as of 09/09/2022.    Allergies (verified) Erythromycin, Statins, Codeine, Hctz [hydrochlorothiazide], and Lipitor [atorvastatin]   History: Past Medical History:  Diagnosis Date   Chronic headaches    Depression    Diastolic dysfunction    a. 12/2013 Echo: EF 60-65%, Gr1 DD, Ao sclerosis w/o stenosis, triv MR, mod dil LA, mild TR.   Diverticulosis    GERD (gastroesophageal reflux disease)    History of hiatal hernia    with repair   History of stress test    a. 10/2008 MV: Ef 75%, nl perfusion.   HTN (hypertension)    Hyperlipidemia    Migraine    Obesity    Osteopenia    [place]    a. Anticoagulated w/ Eliquis (CHA2DS2VASc = 3).   Past Surgical History:  Procedure Laterality Date   CATARACT EXTRACTION, BILATERAL     COLONOSCOPY     ELBOW SURGERY  left   Tendon Surgery   ESOPHAGEAL MANOMETRY N/A 02/23/2018   Procedure: ESOPHAGEAL MANOMETRY (  EM);  Surgeon: Mauri Pole, MD;  Location: [facility];  Service: Endoscopy;  Laterality: N/A;   EYE SURGERY Left    L-yey myopia   HERNIA REPAIR  3220   nissan fundiplication   KNEE ARTHROSCOPY  2005   R-knee   LAPAROSCOPIC NISSEN FUNDOPLICATION     ROTATOR CUFF REPAIR Right    TONSILLECTOMY     TUBAL LIGATION     UPPER GASTROINTESTINAL ENDOSCOPY     Family History  Problem Relation Age of Onset   COPD Mother    Asthma Mother    Arthritis Father    Hypertension Father    Stroke Father    Cancer Father    Prostate cancer Father    Hypertension Sister    Diabetes Sister    Hypertension Brother    Cancer Sister        UTERINE   Cancer Son        TESTICULAR   Crohn's disease Daughter    Pancreatic cancer Other    Colon cancer Cousin    Esophageal cancer Neg Hx    Stomach cancer Neg Hx    Liver disease Neg Hx    Rectal  cancer Neg Hx    Social History   Socioeconomic History   Marital status: Divorced    Spouse name: Not on file   Number of children: 3   Years of education: Not on file   Highest education level: Not on file  Occupational History   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Medicine    Comment: retired  Tobacco Use   Smoking status: Former    Types: Cigarettes    Quit date: 09/07/1971    Years since quitting: 51.0   Smokeless tobacco: Never  Vaping Use   Vaping Use: Never used  Substance and Sexual Activity   Alcohol use: Not Currently   Drug use: No   Sexual activity: Not on file  Other Topics Concern   Not on file  Social History Narrative   Divorced, 3 children lives alone   She is a retired Development worker, [employer]   Former smoker no alcohol no drug use   Social Determinants of Radio broadcast assistant Strain: Low Risk  (09/09/2022)   Overall Financial Resource Strain (CARDIA)    Difficulty of Paying Living Expenses: Not hard at all  Food Insecurity: No Food Insecurity (09/09/2022)   Hunger Vital Sign    Worried About Running Out of Food in the Last Year: Never true    Ran Out of Food in the Last Year: Never true  Transportation Needs: No Transportation Needs (09/09/2022)   PRAPARE - Hydrologist (Medical): No    Lack of Transportation (Non-Medical): No  Physical Activity: Insufficiently Active (09/09/2022)   Exercise Vital Sign    Days of Exercise per Week: 2 days    Minutes of Exercise per Session: 20 min  Stress: Unknown (09/09/2022)   [place]    Feeling of Stress : Patient refused  Social Connections: Unknown (09/09/2022)   Social Connection and Isolation Panel [NHANES]    Frequency of Communication with Friends and Family: Patient refused    Frequency of Social Gatherings with Friends and Family: Patient refused    Attends Religious  Services: Patient refused    Active Member of Clubs or Organizations: Patient refused    Attends Archivist Meetings: Patient refused  Marital Status: Patient refused    Tobacco Counseling Counseling given: Not Answered   Clinical Intake:                 Diabetic? Yes         Activities of Daily Living    09/09/2022   12:58 PM  In your present state of health, do you have any difficulty performing the following activities:  Hearing? 0  Vision? 0  Difficulty concentrating or making decisions? 0  Walking or climbing stairs? 0  Dressing or bathing? 0  Doing errands, shopping? 0  Preparing Food and eating ? N  Using the Toilet? N  In the past six months, have you accidently leaked urine? N  Do you have problems with loss of bowel control? N  Managing your Medications? N  Managing your Finances? N  Housekeeping or managing your Housekeeping? N    Patient Care Team: Elby Showers, MD as PCP - General (Internal Medicine) Stanford Breed Denice Bors, MD as PCP - Cardiology (Cardiology) Stanford Breed Denice Bors, MD as Consulting Physician (Cardiology)  Indicate any recent Medical Services you may have received from other than [facility] providers in the past year (date may be approximate).     Assessment:   This is a routine wellness examination for Olivia Reynolds.  Hearing/Vision screen No results found.  Dietary issues and exercise activities discussed: Current Exercise Habits: Home exercise routine, Type of exercise: Other - see comments, Time (Minutes): 20, Frequency (Times/Week): 2, Weekly Exercise (Minutes/Week): 40, Intensity: Mild, Exercise limited by: Other - see comments   Goals Addressed   None   Depression Screen    09/09/2022   12:58 PM 09/07/2022    4:02 PM 07/28/2022   11:54 AM 08/11/2021    3:18 PM 08/08/2020    2:16 PM 10/06/2018    3:08 PM 04/14/2016   10:22 AM  PHQ 2/9 Scores  PHQ - 2 Score 0 0 0 0 0 0 0    Fall Risk    09/09/2022   12:57 PM  09/07/2022    4:02 PM 07/28/2022   11:54 AM 08/11/2021    3:18 PM 08/08/2020    2:16 PM  Fall Risk   Falls in the past year? 0 0 0 0 1  Number falls in past yr: 0 0 0 0 1  Injury with Fall? 0 0 0  0  Risk for fall due to : No Fall Risks No Fall Risks No Fall Risks No Fall Risks   Follow up Falls prevention discussed Falls prevention discussed Falls evaluation completed Falls evaluation completed Falls evaluation completed       Cognitive Function:        09/09/2022   12:59 PM 08/11/2021    3:20 PM  6CIT Screen  What Year? 0 points 0 points  What month? 0 points 0 points  What time? 0 points 0 points  Count back from 20 0 points 0 points  Months in reverse 0 points 0 points  Repeat phrase  0 points  Total Score  0 points    Immunizations Immunization History  Administered Date(s) Administered   Fluad Quad(high Dose 65+) 07/18/2019   Influenza Split 07/18/2012   Influenza,inj,Quad PF,6+ Mos 08/15/2013, 09/12/2018, 08/11/2021   PFIZER(Purple Top)SARS-COV-2 Vaccination 11/03/2019, 11/28/2019, 08/06/2020   PNEUMOCOCCAL CONJUGATE-20 05/04/2022   Pneumococcal Polysaccharide-23 07/09/2020   Td 04/22/1999   Tdap 07/18/2012    TDAP status: Due, Education has been provided regarding the importance of this vaccine. Advised may  receive this vaccine at local pharmacy or Health Dept. Aware to provide a copy of the vaccination record if obtained from local pharmacy or Health Dept. Verbalized acceptance and understanding.  Flu Vaccine status: Due, Education has been provided regarding the importance of this vaccine. Advised may receive this vaccine at local pharmacy or Health Dept. Aware to provide a copy of the vaccination record if obtained from local pharmacy or Health Dept. Verbalized acceptance and understanding.  Pneumococcal vaccine status: Up to date  Covid-19 vaccine status: Information provided on how to obtain vaccines.   Qualifies for Shingles Vaccine? No   Zostavax  completed No   Shingrix Completed?: No.    Education has been provided regarding the importance of this vaccine. Patient has been advised to call insurance company to determine out of pocket expense if they have not yet received this vaccine. Advised may also receive vaccine at local pharmacy or Health Dept. Verbalized acceptance and understanding.  Screening Tests Health Maintenance  Topic Date Due   DTaP/Tdap/Td (3 - Td or Tdap) 07/18/2022   INFLUENZA VACCINE  03/09/2023 (Originally 04/28/2022)   Medicare Annual Wellness (AWV)  09/10/2023   MAMMOGRAM  05/01/2024   COLONOSCOPY (Pts 45-65yr Insurance coverage will need to be confirmed)  05/16/2024   Pneumonia Vaccine 71 Years old  Completed   DEXA SCAN  Completed   HPV VACCINES  Aged Out   COVID-19 Vaccine  Discontinued   Hepatitis C Screening  Discontinued   Zoster Vaccines- Shingrix  Discontinued    Health Maintenance  Health Maintenance Due  Topic Date Due   DTaP/Tdap/Td (3 - Td or Tdap) 07/18/2022    Colorectal cancer screening: Type of screening: Colonoscopy. Completed 05/16/12. Repeat every 5 years  Mammogram status: Completed 2. Repeat every year  Bone Density status: Completed 05/01/22. Results reflect: Bone density results: OSTEOPENIA. Repeat every TBD years.  Lung Cancer Screening: (Low Dose CT Chest recommended if Age 71-80years, 30 pack-year currently smoking OR have quit w/in 15years.) does not qualify.   Lung Cancer Screening Referral: N/A  Additional Screening:  Hepatitis C Screening: does not qualify; Completed N/A  Vision Screening: Recommended annual ophthalmology exams for early detection of glaucoma and other disorders of the eye. Is the patient up to date with their annual eye exam?  Yes  Who is the provider or what is the name of the office in which the patient attends annual eye exams?  If pt is not established with a provider, would they like to be referred to a provider to establish care? No .    Dental Screening: Recommended annual dental exams for proper oral hygiene  Community Resource Referral / Chronic Care Management: CRR required this visit?  No   CCM required this visit?  No      Plan:     I have personally reviewed and noted the following in the patient's chart:   Medical and social history Use of alcohol, tobacco or illicit drugs  Current medications and supplements including opioid prescriptions. Patient is not currently taking opioid prescriptions. Functional ability and status Nutritional status Physical activity Advanced directives List of other physicians Hospitalizations, surgeries, and ER visits in previous 12 months Vitals Screenings to include cognitive, depression, and falls Referrals and appointments  In addition, I have reviewed and discussed with patient certain preventive protocols, quality metrics, and best practice recommendations. A written personalized care plan for preventive services as well as general preventive health recommendations were provided to patient.  Pearlean Brownie, Page   09/09/2022   Nurse Notes:        Subjective:   Olivia Reynolds is a 71 y.o. female who presents for Medicare Annual (Subsequent) preventive examination.  Review of Systems     Cardiac Risk Factors include: advanced age (>58mn, >>39women)     Objective:    Today's Vitals   09/09/22 1429  Weight: 187 lb (84.8 kg)   Body mass index is 32.1 kg/m.     09/09/2022   12:58 PM 08/11/2021    3:18 PM 08/14/2018   12:04 AM 06/18/2016    8:25 PM  Advanced Directives  Does Patient Have a Medical Advance Directive? No No No No  Would patient like information on creating a medical advance directive? No - Patient declined No - Patient declined  No - patient declined information    Current Medications (verified) Outpatient Encounter Medications as of 09/09/2022  Medication Sig   acetaminophen (TYLENOL) 500 MG tablet Take 500 mg by mouth  every 6 (six) hours as needed.   azithromycin (ZITHROMAX) 250 MG tablet Take 2 tablets on day 1, then 1 tablet daily on days 2 through 5   buPROPion (WELLBUTRIN XL) 300 MG 24 hr tablet Take 300 mg by mouth every morning.   ELIQUIS 5 MG TABS tablet TAKE 1 TABLET(5 MG) BY MOUTH TWICE DAILY   HYDROcodone bit-homatropine (HYCODAN) 5-1.5 MG/5ML syrup Take 5 mLs by mouth every 8 (eight) hours as needed for cough.   metoprolol succinate (TOPROL-XL) 25 MG 24 hr tablet TAKE 1 AND 1/2 TABLETS(37.5 MG) BY MOUTH DAILY   mirtazapine (REMERON) 15 MG tablet TAKE 1 TABLET(15 MG) BY MOUTH AT BEDTIME   pantoprazole (PROTONIX) 40 MG tablet TAKE 1 TABLET(40 MG) BY MOUTH TWICE DAILY   rosuvastatin (CRESTOR) 20 MG tablet TAKE 1 TABLET BY MOUTH EVERY DAY   temazepam (RESTORIL) 15 MG capsule Take 15 mg by mouth at bedtime as needed.   No facility-administered encounter medications on file as of 09/09/2022.    Allergies (verified) Erythromycin, Statins, Codeine, Hctz [hydrochlorothiazide], and Lipitor [atorvastatin]   History: Past Medical History:  Diagnosis Date   Chronic headaches    Depression    Diastolic dysfunction    a. 12/2013 Echo: EF 60-65%, Gr1 DD, Ao sclerosis w/o stenosis, triv MR, mod dil LA, mild TR.   Diverticulosis    GERD (gastroesophageal reflux disease)    History of hiatal hernia    with repair   History of stress test    a. 10/2008 MV: Ef 75%, nl perfusion.   HTN (hypertension)    Hyperlipidemia    Migraine    Obesity    Osteopenia    PAF [place]ox[place]) [place]    a. Anticoagulated w/ Eliquis (CHA2DS2VASc = 3).   Past Surgical History:  Procedure Laterality Date   CATARACT EXTRACTION, BILATERAL     COLONOSCOPY     ELBOW SURGERY  left   Tendon Surgery   ESOPHAGEAL MANOMETRY N/A 02/23/2018   Procedure: ESOPHAGEAL MANOMETRY (EM);  Surgeon: NMauri Pole MD;  Location: [facility];  Service: Endoscopy;  Laterality: N/A;   EYE SURGERY Left    L-yey myopia    HERNIA REPAIR  22706  nissan fundiplication   KNEE ARTHROSCOPY  2005   R-knee   LAPAROSCOPIC NISSEN FUNDOPLICATION     ROTATOR CUFF REPAIR Right    TONSILLECTOMY     TUBAL LIGATION     UPPER GASTROINTESTINAL ENDOSCOPY  Family History  Problem Relation Age of Onset   COPD Mother    Asthma Mother    Arthritis Father    Hypertension Father    Stroke Father    Cancer Father    Prostate cancer Father    Hypertension Sister    Diabetes Sister    Hypertension Brother    Cancer Sister        UTERINE   Cancer Son        TESTICULAR   Crohn's disease Daughter    Pancreatic cancer Other    Colon cancer Cousin    Esophageal cancer Neg Hx    Stomach cancer Neg Hx    Liver disease Neg Hx    Rectal cancer Neg Hx    Social History   Socioeconomic History   Marital status: Divorced    Spouse name: Not on file   Number of children: 3   Years of education: Not on file   Highest education level: Not on file  Occupational History   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Medicine    Comment: retired  Tobacco Use   Smoking status: Former    Types: Cigarettes    Quit date: 09/07/1971    Years since quitting: 51.0   Smokeless tobacco: Never  Vaping Use   Vaping Use: Never used  Substance and Sexual Activity   Alcohol use: Not Currently   Drug use: No   Sexual activity: Not on file  Other Topics Concern   Not on file  Social History Narrative   Divorced, 3 children lives alone   She is a retired Development worker, [employer]   Former smoker no alcohol no drug use   Social Determinants of Radio broadcast assistant Strain: Low Risk  (09/09/2022)   Overall Financial Resource Strain (CARDIA)    Difficulty of Paying Living Expenses: Not hard at all  Food Insecurity: No Food Insecurity (09/09/2022)   Hunger Vital Sign    Worried About Running Out of Food in the Last Year: Never true    [place] in the Last Year: Never true  Transportation  Needs: No Transportation Needs (09/09/2022)   PRAPARE - Hydrologist (Medical): No    Lack of Transportation (Non-Medical): No  Physical Activity: Insufficiently Active (09/09/2022)   Exercise Vital Sign    Days of Exercise per Week: 2 days    Minutes of Exercise per Session: 20 min  Stress: Unknown (09/09/2022)   [place]    Feeling of Stress : Patient refused  Social Connections: Unknown (09/09/2022)   Social Connection and Isolation Panel [NHANES]    Frequency of Communication with Friends and Family: Patient refused    Frequency of Social Gatherings with Friends and Family: Patient refused    Attends Religious Services: Patient refused    Active Member of Clubs or Organizations: Patient refused    Attends Archivist Meetings: Patient refused    Marital Status: Patient refused    Tobacco Counseling Counseling given: Not Answered   Clinical Intake:                 Diabetic?yes         Activities of Daily Living    09/09/2022   12:58 PM  In your present state of health, do you have any difficulty performing the following activities:  Hearing? 0  Vision? 0  Difficulty concentrating  or making decisions? 0  Walking or climbing stairs? 0  Dressing or bathing? 0  Doing errands, shopping? 0  Preparing Food and eating ? N  Using the Toilet? N  In the past six months, have you accidently leaked urine? N  Do you have problems with loss of bowel control? N  Managing your Medications? N  Managing your Finances? N  Housekeeping or managing your Housekeeping? N    Patient Care Team: Elby Showers, MD as PCP - General (Internal Medicine) Stanford Breed Denice Bors, MD as PCP - Cardiology (Cardiology) Stanford Breed Denice Bors, MD as Consulting Physician (Cardiology)  Indicate any recent Medical Services you may have received from other than [facility] providers in the past year  (date may be approximate).     Assessment:   This is a routine wellness examination for Olivia Reynolds.  Hearing/Vision screen No results found.  Dietary issues and exercise activities discussed: Current Exercise Habits: Home exercise routine, Type of exercise: Other - see comments, Time (Minutes): 20, Frequency (Times/Week): 2, Weekly Exercise (Minutes/Week): 40, Intensity: Mild, Exercise limited by: Other - see comments   Goals Addressed   None   Depression Screen    09/09/2022   12:58 PM 09/07/2022    4:02 PM 07/28/2022   11:54 AM 08/11/2021    3:18 PM 08/08/2020    2:16 PM 10/06/2018    3:08 PM 04/14/2016   10:22 AM  PHQ 2/9 Scores  PHQ - 2 Score 0 0 0 0 0 0 0    Fall Risk    09/09/2022   12:57 PM 09/07/2022    4:02 PM 07/28/2022   11:54 AM 08/11/2021    3:18 PM 08/08/2020    2:16 PM  Fall Risk   Falls in the past year? 0 0 0 0 1  Number falls in past yr: 0 0 0 0 1  Injury with Fall? 0 0 0  0  Risk for fall due to : No Fall Risks No Fall Risks No Fall Risks No Fall Risks   Follow up Falls prevention discussed Falls prevention discussed Falls evaluation completed Falls evaluation completed Falls evaluation completed    [place]:    ASSISTIVE DEVICES UTILIZED TO PREVENT FALLS:    Cognitive Function:        09/09/2022   12:59 PM 08/11/2021    3:20 PM  6CIT Screen  What Year? 0 points 0 points  What month? 0 points 0 points  What time? 0 points 0 points  Count back from 20 0 points 0 points  Months in reverse 0 points 0 points  Repeat phrase  0 points  Total Score  0 points    Immunizations Immunization History  Administered Date(s) Administered   Fluad Quad(high Dose 65+) 07/18/2019   Influenza Split 07/18/2012   Influenza,inj,Quad PF,6+ Mos 08/15/2013, 09/12/2018, 08/11/2021   PFIZER(Purple Top)SARS-COV-2 Vaccination 11/03/2019, 11/28/2019, 08/06/2020   PNEUMOCOCCAL CONJUGATE-20 05/04/2022   Pneumococcal  Polysaccharide-23 07/09/2020   Td 04/22/1999   Tdap 07/18/2012    Screening Tests Health Maintenance  Topic Date Due   DTaP/Tdap/Td (3 - Td or Tdap) 07/18/2022   INFLUENZA VACCINE  03/09/2023 (Originally 04/28/2022)   Medicare Annual Wellness (AWV)  09/10/2023   MAMMOGRAM  05/01/2024   COLONOSCOPY (Pts 45-60yr Insurance coverage will need to be confirmed)  05/16/2024   Pneumonia Vaccine 71 Years old  Completed   DEXA SCAN  Completed   HPV VACCINES  Aged Out   COVID-19 Vaccine  Discontinued   Hepatitis C Screening  Discontinued   Zoster Vaccines- Shingrix  Discontinued

## 2022-09-09 NOTE — Progress Notes (Signed)
Subjective:   Olivia Reynolds is a 71 y.o. female who presents for Medicare Annual (Subsequent) preventive examination.  Review of Systems    yes Cardiac Risk Factors include: advanced age (>75mn, >>15women)     Objective:    Today's Vitals   09/09/22 1429  Weight: 187 lb (84.8 kg)   Body mass index is 32.1 kg/m.     09/09/2022   12:58 PM 08/11/2021    3:18 PM 08/14/2018   12:04 AM 06/18/2016    8:25 PM  Advanced Directives  Does Patient Have a Medical Advance Directive? No No No No  Would patient like information on creating a medical advance directive? No - Patient declined No - Patient declined  No - patient declined information    Current Medications (verified) Outpatient Encounter Medications as of 09/09/2022  Medication Sig   acetaminophen (TYLENOL) 500 MG tablet Take 500 mg by mouth every 6 (six) hours as needed.   azithromycin (ZITHROMAX) 250 MG tablet Take 2 tablets on day 1, then 1 tablet daily on days 2 through 5   buPROPion (WELLBUTRIN XL) 300 MG 24 hr tablet Take 300 mg by mouth every morning.   ELIQUIS 5 MG TABS tablet TAKE 1 TABLET(5 MG) BY MOUTH TWICE DAILY   HYDROcodone bit-homatropine (HYCODAN) 5-1.5 MG/5ML syrup Take 5 mLs by mouth every 8 (eight) hours as needed for cough.   metoprolol succinate (TOPROL-XL) 25 MG 24 hr tablet TAKE 1 AND 1/2 TABLETS(37.5 MG) BY MOUTH DAILY   mirtazapine (REMERON) 15 MG tablet TAKE 1 TABLET(15 MG) BY MOUTH AT BEDTIME   pantoprazole (PROTONIX) 40 MG tablet TAKE 1 TABLET(40 MG) BY MOUTH TWICE DAILY   rosuvastatin (CRESTOR) 20 MG tablet TAKE 1 TABLET BY MOUTH EVERY DAY   temazepam (RESTORIL) 15 MG capsule Take 15 mg by mouth at bedtime as needed.   No facility-administered encounter medications on file as of 09/09/2022.    Allergies (verified) Erythromycin, Statins, Codeine, Hctz [hydrochlorothiazide], and Lipitor [atorvastatin]   History: Past Medical History:  Diagnosis Date   Chronic headaches    Depression     Diastolic dysfunction    a. 12/2013 Echo: EF 60-65%, Gr1 DD, Ao sclerosis w/o stenosis, triv MR, mod dil LA, mild TR.   Diverticulosis    GERD (gastroesophageal reflux disease)    History of hiatal hernia    with repair   History of stress test    a. 10/2008 MV: Ef 75%, nl perfusion.   HTN (hypertension)    Hyperlipidemia    Migraine    Obesity    Osteopenia    [place]    a. Anticoagulated w/ Eliquis (CHA2DS2VASc = 3).   Past Surgical History:  Procedure Laterality Date   CATARACT EXTRACTION, BILATERAL     COLONOSCOPY     ELBOW SURGERY  left   Tendon Surgery   ESOPHAGEAL MANOMETRY N/A 02/23/2018   Procedure: ESOPHAGEAL MANOMETRY (EM);  Surgeon: NMauri Pole MD;  Location: [facility];  Service: Endoscopy;  Laterality: N/A;   EYE SURGERY Left    L-yey myopia   HERNIA REPAIR  [id number]  nissan fundiplication   KNEE ARTHROSCOPY  2005   R-knee   LAPAROSCOPIC NISSEN FUNDOPLICATION     ROTATOR CUFF REPAIR Right    TONSILLECTOMY     TUBAL LIGATION     UPPER GASTROINTESTINAL ENDOSCOPY     Family History  Problem Relation Age of Onset   COPD Mother    Asthma  Mother    Arthritis Father    Hypertension Father    Stroke Father    Cancer Father    Prostate cancer Father    Hypertension Sister    Diabetes Sister    Hypertension Brother    Cancer Sister        UTERINE   Cancer Son        TESTICULAR   Crohn's disease Daughter    Pancreatic cancer Other    Colon cancer Cousin    Esophageal cancer Neg Hx    Stomach cancer Neg Hx    Liver disease Neg Hx    Rectal cancer Neg Hx    Social History   Socioeconomic History   Marital status: Divorced    Spouse name: Not on file   Number of children: 3   Years of education: Not on file   Highest education level: Not on file  Occupational History   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Medicine    Comment: retired  Tobacco Use   Smoking status: Former    Types: Cigarettes     Quit date: 09/07/1971    Years since quitting: 51.0   Smokeless tobacco: Never  Vaping Use   Vaping Use: Never used  Substance and Sexual Activity   Alcohol use: Not Currently   Drug use: No   Sexual activity: Not on file  Other Topics Concern   Not on file  Social History Narrative   Divorced, 3 children lives alone   She is a retired Development worker, [employer]   Former smoker no alcohol no drug use   Social Determinants of Radio broadcast assistant Strain: Low Risk  (09/09/2022)   Overall Financial Resource Strain (CARDIA)    Difficulty of Paying Living Expenses: Not hard at all  Food Insecurity: No Food Insecurity (09/09/2022)   Hunger Vital Sign    Worried About Running Out of Food in the Last Year: Never true    [place] in the Last Year: Never true  Transportation Needs: No Transportation Needs (09/09/2022)   PRAPARE - Hydrologist (Medical): No    Lack of Transportation (Non-Medical): No  Physical Activity: Insufficiently Active (09/09/2022)   Exercise Vital Sign    Days of Exercise per Week: 2 days    Minutes of Exercise per Session: 20 min  Stress: Unknown (09/09/2022)   [place]    Feeling of Stress : Patient refused  Social Connections: Unknown (09/09/2022)   Social Connection and Isolation Panel [NHANES]    Frequency of Communication with Friends and Family: Patient refused    Frequency of Social Gatherings with Friends and Family: Patient refused    Attends Religious Services: Patient refused    Active Member of Clubs or Organizations: Patient refused    Attends Archivist Meetings: Patient refused    Marital Status: Patient refused    Tobacco Counseling Counseling given: Not Answered   Clinical Intake:                 Diabetic?yes         Activities of Daily Living    09/09/2022   12:58 PM  In  your present state of health, do you have any difficulty performing the following activities:  Hearing? 0  Vision? 0  Difficulty concentrating or making decisions? 0  Walking or climbing stairs? 0  Dressing or bathing? 0  Doing errands, shopping? 0  Preparing Food and eating ? N  Using the Toilet? N  In the past six months, have you accidently leaked urine? N  Do you have problems with loss of bowel control? N  Managing your Medications? N  Managing your Finances? N  Housekeeping or managing your Housekeeping? N    Patient Care Team: Elby Showers, MD as PCP - General (Internal Medicine) Stanford Breed Denice Bors, MD as PCP - Cardiology (Cardiology) Stanford Breed Denice Bors, MD as Consulting Physician (Cardiology)  Indicate any recent Medical Services you may have received from other than [facility] providers in the past year (date may be approximate).     Assessment:   This is a routine wellness examination for N[place]ole.  Hearing/Vision screen No results found.  Dietary issues and exercise activities discussed: Current Exercise Habits: Home exercise routine, Type of exercise: Other - see comments, Time (Minutes): 20, Frequency (Times/Week): 2, Weekly Exercise (Minutes/Week): 40, Intensity: Mild, Exercise limited by: Other - see comments   Goals Addressed   None   Depression Screen    09/09/2022   12:58 PM 09/07/2022    4:02 PM 07/28/2022   11:54 AM 08/11/2021    3:18 PM 08/08/2020    2:16 PM 10/06/2018    3:08 PM 04/14/2016   10:22 AM  PHQ 2/9 Scores  PHQ - 2 Score 0 0 0 0 0 0 0    Fall Risk    09/09/2022   12:57 PM 09/07/2022    4:02 PM 07/28/2022   11:54 AM 08/11/2021    3:18 PM 08/08/2020    2:16 PM  Fall Risk   Falls in the past year? 0 0 0 0 1  Number falls in past yr: 0 0 0 0 1  Injury with Fall? 0 0 0  0  Risk for fall due to : No Fall Risks No Fall Risks No Fall Risks No Fall Risks   Follow up Falls prevention discussed Falls prevention discussed Falls evaluation  completed Falls evaluation completed Falls evaluation completed    Industry:  Any stairs in or around the home?  N/A If so, are there any without handrails?  N/A Home free of loose throw rugs in walkways, pet beds, electrical cords, etc? N/A Adequate lighting in your home to reduce risk of falls? Yes   ASSISTIVE DEVICES UTILIZED TO PREVENT FALLS:  Life alert? N/A Use of a cane, walker or w/c? N/A Grab bars in the bathroom? N/A Shower chair or bench in shower? N/A Elevated toilet seat or a handicapped toilet? N/A  TIMED UP AND GO:    Cognitive Function:        09/09/2022   12:59 PM 08/11/2021    3:20 PM  6CIT Screen  What Year? 0 points 0 points  What month? 0 points 0 points  What time? 0 points 0 points  Count back from 20 0 points 0 points  Months in reverse 0 points 0 points  Repeat phrase  0 points  Total Score  0 points    Immunizations Immunization History  Administered Date(s) Administered   Fluad Quad(high Dose 65+) 07/18/2019   Influenza Split 07/18/2012   Influenza,inj,Quad PF,6+ Mos 08/15/2013, 09/12/2018, 08/11/2021   PFIZER(Purple Top)SARS-COV-2 Vaccination 11/03/2019, 11/28/2019, 08/06/2020   PNEUMOCOCCAL CONJUGATE-20 05/04/2022   Pneumococcal Polysaccharide-23 07/09/2020   Td 04/22/1999   Tdap 07/18/2012    TDAP status: Due, Education has been provided regarding the importance of this vaccine. Advised  may receive this vaccine at local pharmacy or Health Dept. Aware to provide a copy of the vaccination record if obtained from local pharmacy or Health Dept. Verbalized acceptance and understanding.  Flu Vaccine status: Due, Education has been provided regarding the importance of this vaccine. Advised may receive this vaccine at local pharmacy or Health Dept. Aware to provide a copy of the vaccination record if obtained from local pharmacy or Health Dept. Verbalized acceptance and understanding.  Pneumococcal  vaccine status: Up to date  Covid-19 vaccine status: Information provided on how to obtain vaccines.   Qualifies for Shingles Vaccine? Yes   Zostavax completed No   Shingrix Completed?: No.    Education has been provided regarding the importance of this vaccine. Patient has been advised to call insurance company to determine out of pocket expense if they have not yet received this vaccine. Advised may also receive vaccine at local pharmacy or Health Dept. Verbalized acceptance and understanding.  Screening Tests Health Maintenance  Topic Date Due   DTaP/Tdap/Td (3 - Td or Tdap) 07/18/2022   INFLUENZA VACCINE  03/09/2023 (Originally 04/28/2022)   Medicare Annual Wellness (AWV)  09/10/2023   MAMMOGRAM  05/01/2024   COLONOSCOPY (Pts 45-23yr Insurance coverage will need to be confirmed)  05/16/2024   Pneumonia Vaccine 71 Years old  Completed   DEXA SCAN  Completed   HPV VACCINES  Aged Out   COVID-19 Vaccine  Discontinued   Hepatitis C Screening  Discontinued   Zoster Vaccines- Shingrix  Discontinued    Health Maintenance  Health Maintenance Due  Topic Date Due   DTaP/Tdap/Td (3 - Td or Tdap) 07/18/2022          Lung Cancer Screening: (Low Dose CT Chest recommended if Age 661-80years, 30 pack-year currently smoking OR have quit w/in 15years.) does not qualify.   Lung Cancer Screening Referral:   Additional Screening:  Hepatitis C Screening: does not qualify; Completed   Vision Screening: Recommended annual ophthalmology exams for early detection of glaucoma and other disorders of the eye. Is the patient up to date with their annual eye exam?  Yes  Who is the provider or what is the name of the office in which the patient attends annual eye exams? TBD If pt is not established with a provider, would they like to be referred to a provider to establish care? No .   Dental Screening: Recommended annual dental exams for proper oral hygiene  Community Resource Referral /  Chronic Care Management: CRR required this visit?  No   CCM required this visit?  No      Plan:     I have personally reviewed and noted the following in the patient's chart:   Medical and social history Use of alcohol, tobacco or illicit drugs  Current medications and supplements including opioid prescriptions. Patient is not currently taking opioid prescriptions. Functional ability and status Nutritional status Physical activity Advanced directives List of other physicians Hospitalizations, surgeries, and ER visits in previous 12 months Vitals Screenings to include cognitive, depression, and falls Referrals and appointments  In addition, I have reviewed and discussed with patient certain preventive protocols, quality metrics, and best practice recommendations. A written personalized care plan for preventive services as well as general preventive health recommendations were provided to patient.     LPearlean Brownie CBison  09/09/2022   Nurse Notes:    IEssie Christine MD, have reviewed all documentation for this visit. The documentation on 09/09/22 for  the exam, diagnosis, procedures, and orders are all accurate and complete.

## 2022-09-09 NOTE — Progress Notes (Deleted)
Subjective:    Patient ID: Olivia Reynolds , female    DOB: 06/18/1951, 71 y.o.    MRN: [id number]   71 y.o. female presents today for : Chief Complaint  Patient presents with   Medicare Wellness     HPI     ROS      Objective:   There were no vitals filed for this visit.   Physical Exam       Assessment & Plan:      Oval Moralez Barron Alvine, CMA      Subjective:   Olivia Reynolds is a 71 y.o. female who presents for Medicare Annual (Subsequent) preventive examination.  Review of Systems     Cardiac Risk Factors include: advanced age (>7mn, >>29women)     Objective:    Today's Vitals   09/09/22 1429  Weight: 187 lb (84.8 kg)   Body mass index is 32.1 kg/m.     09/09/2022   12:58 PM 08/11/2021    3:18 PM 08/14/2018   12:04 AM 06/18/2016    8:25 PM  Advanced Directives  Does Patient Have a Medical Advance Directive? No No No No  Would patient like information on creating a medical advance directive? No - Patient declined No - Patient declined  No - patient declined information    Current Medications (verified) Outpatient Encounter Medications as of 09/09/2022  Medication Sig   acetaminophen (TYLENOL) 500 MG tablet Take 500 mg by mouth every 6 (six) hours as needed.   azithromycin (ZITHROMAX) 250 MG tablet Take 2 tablets on day 1, then 1 tablet daily on days 2 through 5   buPROPion (WELLBUTRIN XL) 300 MG 24 hr tablet Take 300 mg by mouth every morning.   ELIQUIS 5 MG TABS tablet TAKE 1 TABLET(5 MG) BY MOUTH TWICE DAILY   HYDROcodone bit-homatropine (HYCODAN) 5-1.5 MG/5ML syrup Take 5 mLs by mouth every 8 (eight) hours as needed for cough.   metoprolol succinate (TOPROL-XL) 25 MG 24 hr tablet TAKE 1 AND 1/2 TABLETS(37.5 MG) BY MOUTH DAILY   mirtazapine (REMERON) 15 MG tablet TAKE 1 TABLET(15 MG) BY MOUTH AT BEDTIME   pantoprazole (PROTONIX) 40 MG tablet TAKE 1 TABLET(40 MG) BY MOUTH TWICE DAILY   rosuvastatin (CRESTOR) 20 MG tablet TAKE 1 TABLET BY  MOUTH EVERY DAY   temazepam (RESTORIL) 15 MG capsule Take 15 mg by mouth at bedtime as needed.   No facility-administered encounter medications on file as of 09/09/2022.    Allergies (verified) Erythromycin, Statins, Codeine, Hctz [hydrochlorothiazide], and Lipitor [atorvastatin]   History: Past Medical History:  Diagnosis Date   Chronic headaches    Depression    Diastolic dysfunction    a. 12/2013 Echo: EF 60-65%, Gr1 DD, Ao sclerosis w/o stenosis, triv MR, mod dil LA, mild TR.   Diverticulosis    GERD (gastroesophageal reflux disease)    History of hiatal hernia    with repair   History of stress test    a. 10/2008 MV: Ef 75%, nl perfusion.   HTN (hypertension)    Hyperlipidemia    Migraine    Obesity    Osteopenia    PAF (paroxysmal atrial fibrillation) ([place]    a. Anticoagulated w/ Eliquis (CHA2DS2VASc = 3).   Past Surgical History:  Procedure Laterality Date   CATARACT EXTRACTION, BILATERAL     COLONOSCOPY     ELBOW SURGERY  left   Tendon Surgery   ESOPHAGEAL MANOMETRY N/A 02/23/2018   Procedure: ESOPHAGEAL MANOMETRY (  EM);  Surgeon: Mauri Pole, MD;  Location: [facility];  Service: Endoscopy;  Laterality: N/A;   EYE SURGERY Left    L-yey myopia   HERNIA REPAIR  3016   nissan fundiplication   KNEE ARTHROSCOPY  2005   R-knee   LAPAROSCOPIC NISSEN FUNDOPLICATION     ROTATOR CUFF REPAIR Right    TONSILLECTOMY     TUBAL LIGATION     UPPER GASTROINTESTINAL ENDOSCOPY     Family History  Problem Relation Age of Onset   COPD Mother    Asthma Mother    Arthritis Father    Hypertension Father    Stroke Father    Cancer Father    Prostate cancer Father    Hypertension Sister    Diabetes Sister    Hypertension Brother    Cancer Sister        UTERINE   Cancer Son        TESTICULAR   Crohn's disease Daughter    Pancreatic cancer Other    Colon cancer Cousin    Esophageal cancer Neg Hx    Stomach cancer Neg Hx    Liver disease Neg Hx    Rectal  cancer Neg Hx    Social History   Socioeconomic History   Marital status: Divorced    Spouse name: Not on file   Number of children: 3   Years of education: Not on file   Highest education level: Not on file  Occupational History   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[employer]    Comment: retired  Tobacco Use   Smoking status: Former    Types: Cigarettes    Quit date: 09/07/1971    Years since quitting: 51.0   Smokeless tobacco: Never  Vaping Use   Vaping Use: Never used  Substance and Sexual Activity   Alcohol use: Not Currently   Drug use: No   Sexual activity: Not on file  Other Topics Concern   Not on file  Social History Narrative   Divorced, 3 children lives alone   She is a retired Development worker, [employer]   Former smoker no alcohol no drug use   Social Determinants of Radio broadcast assistant Strain: Low Risk  (09/09/2022)   Overall Financial Resource Strain (CARDIA)    Difficulty of Paying Living Expenses: Not hard at all  Food Insecurity: No Food Insecurity (09/09/2022)   Hunger Vital Sign    Worried About Running Out of Food in the Last Year: Never true    Ran Out of Food in the Last Year: Never true  Transportation Needs: No Transportation Needs (09/09/2022)   PRAPARE - Hydrologist (Medical): No    Lack of Transportation (Non-Medical): No  Physical Activity: Insufficiently Active (09/09/2022)   Exercise Vital Sign    Days of Exercise per Week: 2 days    Minutes of Exercise per Session: 20 min  Stress: Unknown (09/09/2022)   [place]    Feeling of Stress : Patient refused  Social Connections: Unknown (09/09/2022)   Social Connection and Isolation Panel [NHANES]    Frequency of Communication with Friends and Family: Patient refused    Frequency of Social Gatherings with Friends and Family: Patient refused    Attends Religious  Services: Patient refused    Active Member of Clubs or Organizations: Patient refused    Attends Archivist Meetings: Patient refused  Marital Status: Patient refused    Tobacco Counseling Counseling given: Not Answered   Clinical Intake:                 Diabetic? Yes         Activities of Daily Living    09/09/2022   12:58 PM  In your present state of health, do you have any difficulty performing the following activities:  Hearing? 0  Vision? 0  Difficulty concentrating or making decisions? 0  Walking or climbing stairs? 0  Dressing or bathing? 0  Doing errands, shopping? 0  Preparing Food and eating ? N  Using the Toilet? N  In the past six months, have you accidently leaked urine? N  Do you have problems with loss of bowel control? N  Managing your Medications? N  Managing your Finances? N  Housekeeping or managing your Housekeeping? N    Patient Care Team: Elby Showers, MD as PCP - General (Internal Medicine) Stanford Breed Denice Bors, MD as PCP - Cardiology (Cardiology) Stanford Breed Denice Bors, MD as Consulting Physician (Cardiology)  Indicate any recent Medical Services you may have received from other than [facility] providers in the past year (date may be approximate).     Assessment:   This is a routine wellness examination for [place].  Hearing/Vision screen No results found.  Dietary issues and exercise activities discussed: Current Exercise Habits: Home exercise routine, Type of exercise: Other - see comments, Time (Minutes): 20, Frequency (Times/Week): 2, Weekly Exercise (Minutes/Week): 40, Intensity: Mild, Exercise limited by: Other - see comments   Goals Addressed   None   Depression Screen    09/09/2022   12:58 PM 09/07/2022    4:02 PM 07/28/2022   11:54 AM 08/11/2021    3:18 PM 08/08/2020    2:16 PM 10/06/2018    3:08 PM 04/14/2016   10:22 AM  PHQ 2/9 Scores  PHQ - 2 Score 0 0 0 0 0 0 0    Fall Risk    09/09/2022   12:57 PM  09/07/2022    4:02 PM 07/28/2022   11:54 AM 08/11/2021    3:18 PM 08/08/2020    2:16 PM  Fall Risk   Falls in the past year? 0 0 0 0 1  Number falls in past yr: 0 0 0 0 1  Injury with Fall? 0 0 0  0  Risk for fall due to : No Fall Risks No Fall Risks No Fall Risks No Fall Risks   Follow up Falls prevention discussed Falls prevention discussed Falls evaluation completed Falls evaluation completed Falls evaluation completed       Cognitive Function:        09/09/2022   12:59 PM 08/11/2021    3:20 PM  6CIT Screen  What Year? 0 points 0 points  What month? 0 points 0 points  What time? 0 points 0 points  Count back from 20 0 points 0 points  Months in reverse 0 points 0 points  Repeat phrase  0 points  Total Score  0 points    Immunizations Immunization History  Administered Date(s) Administered   Fluad Quad(high Dose 65+) 07/18/2019   Influenza Split 07/18/2012   Influenza,inj,Quad PF,6+ Mos 08/15/2013, 09/12/2018, 08/11/2021   PFIZER(Purple Top)SARS-COV-2 Vaccination 11/03/2019, 11/28/2019, 08/06/2020   PNEUMOCOCCAL CONJUGATE-20 05/04/2022   Pneumococcal Polysaccharide-23 07/09/2020   Td 04/22/1999   Tdap 07/18/2012    TDAP status: Due, Education has been provided regarding the importance of this vaccine. Advised may  receive this vaccine at local pharmacy or Health Dept. Aware to provide a copy of the vaccination record if obtained from local pharmacy or Health Dept. Verbalized acceptance and understanding.  Flu Vaccine status: Due, Education has been provided regarding the importance of this vaccine. Advised may receive this vaccine at local pharmacy or Health Dept. Aware to provide a copy of the vaccination record if obtained from local pharmacy or Health Dept. Verbalized acceptance and understanding.  Pneumococcal vaccine status: Up to date  Covid-19 vaccine status: Information provided on how to obtain vaccines.   Qualifies for Shingles Vaccine? No   Zostavax  completed No   Shingrix Completed?: No.    Education has been provided regarding the importance of this vaccine. Patient has been advised to call insurance company to determine out of pocket expense if they have not yet received this vaccine. Advised may also receive vaccine at local pharmacy or Health Dept. Verbalized acceptance and understanding.  Screening Tests Health Maintenance  Topic Date Due   DTaP/Tdap/Td (3 - Td or Tdap) 07/18/2022   INFLUENZA VACCINE  03/09/2023 (Originally 04/28/2022)   Medicare Annual Wellness (AWV)  09/10/2023   MAMMOGRAM  05/01/2024   COLONOSCOPY (Pts 45-32yr Insurance coverage will need to be confirmed)  05/16/2024   Pneumonia Vaccine 71 Years old  Completed   DEXA SCAN  Completed   HPV VACCINES  Aged Out   COVID-19 Vaccine  Discontinued   Hepatitis C Screening  Discontinued   Zoster Vaccines- Shingrix  Discontinued    Health Maintenance  Health Maintenance Due  Topic Date Due   DTaP/Tdap/Td (3 - Td or Tdap) 07/18/2022    Colorectal cancer screening: Type of screening: Colonoscopy. Completed 05/16/12. Repeat every 5 years  Mammogram status: Completed 2. Repeat every year  Bone Density status: Completed 05/01/22. Results reflect: Bone density results: OSTEOPENIA. Repeat every TBD years.  Lung Cancer Screening: (Low Dose CT Chest recommended if Age 71-80years, 30 pack-year currently smoking OR have quit w/in 15years.) does not qualify.   Lung Cancer Screening Referral: N/A  Additional Screening:  Hepatitis C Screening: does not qualify; Completed N/A  Vision Screening: Recommended annual ophthalmology exams for early detection of glaucoma and other disorders of the eye. Is the patient up to date with their annual eye exam?  Yes  Who is the provider or what is the name of the office in which the patient attends annual eye exams?  If pt is not established with a provider, would they like to be referred to a provider to establish care? No .    Dental Screening: Recommended annual dental exams for proper oral hygiene  Community Resource Referral / Chronic Care Management: CRR required this visit?  No   CCM required this visit?  No      Plan:     I have personally reviewed and noted the following in the patient's chart:   Medical and social history Use of alcohol, tobacco or illicit drugs  Current medications and supplements including opioid prescriptions. Patient is not currently taking opioid prescriptions. Functional ability and status Nutritional status Physical activity Advanced directives List of other physicians Hospitalizations, surgeries, and ER visits in previous 12 months Vitals Screenings to include cognitive, depression, and falls Referrals and appointments  In addition, I have reviewed and discussed with patient certain preventive protocols, quality metrics, and best practice recommendations. A written personalized care plan for preventive services as well as general preventive health recommendations were provided to patient.  Pearlean Brownie, [place]   09/09/2022   Nurse Notes:

## 2022-09-09 NOTE — Telephone Encounter (Signed)
Patient was seen virtually 2 days ago with acute Covid-19 . Did not want Paxlovid at the time. Now wants to take Paxlovid. She is on Eliquis and there may be an interaction between the 2 drugs. I think it best not to take it as  Up to Date Pharmacy info is suggesting a reduction in dose of Eliquis which I prefer not to do. I have spoken with patient and she will obtain pulse ox device. Fever has resolved. Lots of sputum production. Does not sound SOB on phone. Can try oral steroids if not improving.    MJB, MD

## 2022-09-10 ENCOUNTER — Encounter: Payer: Self-pay | Admitting: Internal Medicine

## 2022-10-05 DIAGNOSIS — M5416 Radiculopathy, lumbar region: Secondary | ICD-10-CM | POA: Diagnosis not present

## 2022-10-05 DIAGNOSIS — Z6831 Body mass index (BMI) 31.0-31.9, adult: Secondary | ICD-10-CM | POA: Diagnosis not present

## 2022-10-05 DIAGNOSIS — M461 Sacroiliitis, not elsewhere classified: Secondary | ICD-10-CM | POA: Diagnosis not present

## 2022-10-14 DIAGNOSIS — H04123 Dry eye syndrome of bilateral lacrimal glands: Secondary | ICD-10-CM | POA: Diagnosis not present

## 2022-10-14 DIAGNOSIS — H43813 Vitreous degeneration, bilateral: Secondary | ICD-10-CM | POA: Diagnosis not present

## 2022-10-14 DIAGNOSIS — H26493 Other secondary cataract, bilateral: Secondary | ICD-10-CM | POA: Diagnosis not present

## 2022-10-14 DIAGNOSIS — H52203 Unspecified astigmatism, bilateral: Secondary | ICD-10-CM | POA: Diagnosis not present

## 2022-10-14 DIAGNOSIS — H524 Presbyopia: Secondary | ICD-10-CM | POA: Diagnosis not present

## 2022-10-20 ENCOUNTER — Other Ambulatory Visit: Payer: Self-pay | Admitting: Internal Medicine

## 2022-10-20 DIAGNOSIS — I480 Paroxysmal atrial fibrillation: Secondary | ICD-10-CM

## 2022-10-21 DIAGNOSIS — M5416 Radiculopathy, lumbar region: Secondary | ICD-10-CM | POA: Diagnosis not present

## 2022-11-10 DIAGNOSIS — H26491 Other secondary cataract, right eye: Secondary | ICD-10-CM | POA: Diagnosis not present

## 2022-11-26 NOTE — Progress Notes (Deleted)
Cardiology Clinic Note   Patient Name: Olivia Reynolds Date of Encounter: 11/26/2022  Primary Care Provider:  Elby Showers, MD Primary Cardiologist:  Kirk Ruths, MD  Patient Profile    72 year old female with history of atrial fibrillation CHADS VASC Score of 5 (CHF, HTN, AGE, Gender), HL, HTN, chronic diastolic CHF. Last seen by Dr. Stanford Breed on 10/15/2021.   Past Medical History    Past Medical History:  Diagnosis Date   Chronic headaches    Depression    Diastolic dysfunction    a. 12/2013 Echo: EF 60-65%, Gr1 DD, Ao sclerosis w/o stenosis, triv MR, mod dil LA, mild TR.   Diverticulosis    GERD (gastroesophageal reflux disease)    History of hiatal hernia    with repair   History of stress test    a. 10/2008 MV: Ef 75%, nl perfusion.   HTN (hypertension)    Hyperlipidemia    Migraine    Obesity    Osteopenia    PAF (paroxysmal atrial fibrillation) ([place])    a. Anticoagulated w/ Eliquis (CHA2DS2VASc = 3).   Past Surgical History:  Procedure Laterality Date   CATARACT EXTRACTION, BILATERAL     COLONOSCOPY     ELBOW SURGERY  left   Tendon Surgery   ESOPHAGEAL MANOMETRY N/A 02/23/2018   Procedure: ESOPHAGEAL MANOMETRY (EM);  Surgeon: Mauri Pole, MD;  Location: [facility];  Service: Endoscopy;  Laterality: N/A;   EYE SURGERY Left    L-yey myopia   HERNIA REPAIR  [id number]   nissan fundiplication   KNEE ARTHROSCOPY  2005   R-knee   LAPAROSCOPIC NISSEN FUNDOPLICATION     ROTATOR CUFF REPAIR Right    TONSILLECTOMY     TUBAL LIGATION     UPPER GASTROINTESTINAL ENDOSCOPY      Allergies  Allergies  Allergen Reactions   Erythromycin Hives   Statins     Lipitor   Codeine Hives   Hctz [Hydrochlorothiazide] Rash   Lipitor [Atorvastatin] Other (See Comments)    Joint pain    History of Present Illness    ***  Home Medications    Current Outpatient Medications  Medication Sig Dispense Refill   acetaminophen (TYLENOL) 500 MG tablet Take 500 mg by  mouth every 6 (six) hours as needed.     buPROPion (WELLBUTRIN XL) 300 MG 24 hr tablet Take 300 mg by mouth every morning.     ELIQUIS 5 MG TABS tablet TAKE 1 TABLET(5 MG) BY MOUTH TWICE DAILY 180 tablet 1   HYDROcodone bit-homatropine (HYCODAN) 5-1.5 MG/5ML syrup Take 5 mLs by mouth every 8 (eight) hours as needed for cough. 120 mL 0   metoprolol succinate (TOPROL-XL) 25 MG 24 hr tablet TAKE 1 AND 1/2 TABLETS(37.5 MG) BY MOUTH DAILY 135 tablet 3   mirtazapine (REMERON) 15 MG tablet TAKE 1 TABLET(15 MG) BY MOUTH AT BEDTIME 30 tablet 5   pantoprazole (PROTONIX) 40 MG tablet TAKE 1 TABLET(40 MG) BY MOUTH TWICE DAILY 60 tablet 4   rosuvastatin (CRESTOR) 20 MG tablet TAKE 1 TABLET BY MOUTH EVERY DAY 90 tablet 3   temazepam (RESTORIL) 15 MG capsule Take 15 mg by mouth at bedtime as needed.     No current facility-administered medications for this visit.     Family History    Family History  Problem Relation Age of Onset   COPD Mother    Asthma Mother    Arthritis Father    Hypertension Father    Stroke  Father    Cancer Father    Prostate cancer Father    Hypertension Sister    Diabetes Sister    Hypertension Brother    Cancer Sister        UTERINE   Cancer Son        TESTICULAR   Crohn's disease Daughter    Pancreatic cancer Other    Colon cancer Cousin    Esophageal cancer Neg Hx    Stomach cancer Neg Hx    Liver disease Neg Hx    Rectal cancer Neg Hx    She indicated that her mother is deceased. She indicated that her father is deceased. She indicated that both of her sisters are alive. She indicated that her brother is alive. She indicated that her maternal grandmother is deceased. She indicated that her maternal grandfather is deceased. She indicated that her paternal grandmother is deceased. She indicated that her paternal grandfather is deceased. She indicated that the status of her daughter is unknown. She indicated that the status of her son is unknown. She indicated that  the status of her cousin is unknown. She indicated that the status of her neg hx is unknown. She indicated that the status of her other is unknown.  Social History    Social History   Socioeconomic History   Marital status: Divorced    Spouse name: Not on file   Number of children: 3   Years of education: Not on file   Highest education level: Not on file  Occupational History   Occupation: [employer]    Comment: retired  Tobacco Use   Smoking status: Former    Types: Cigarettes    Quit date: 09/07/1971    Years since quitting: 51.2   Smokeless tobacco: Never  Vaping Use   Vaping Use: Never used  Substance and Sexual Activity   Alcohol use: Not Currently   Drug use: No   Sexual activity: Not on file  Other Topics Concern   Not on file  Social History Narrative   Divorced, 3 children lives alone   She is a retired Development worker, [employer]   Former smoker no alcohol no drug use   Social Determinants of Radio broadcast assistant Strain: Low Risk  (09/09/2022)   Overall Financial Resource Strain (CARDIA)    Difficulty of Paying Living Expenses: Not hard at all  Food Insecurity: No [place] (09/09/2022)   Hunger Vital Sign    Worried About Running Out of Food in the Last Year: Never true    [place] in the Last Year: Never true  Transportation Needs: No Transportation Needs (09/09/2022)   PRAPARE - Hydrologist (Medical): No    Lack of Transportation (Non-Medical): No  Physical Activity: Insufficiently Active (09/09/2022)   Exercise Vital Sign    Days of Exercise per Week: 2 days    Minutes of Exercise per Session: 20 min  Stress: Unknown (09/09/2022)   [place]    Feeling of Stress : Patient refused  Social Connections: Unknown (09/09/2022)   Social Connection and Isolation Panel [NHANES]    Frequency of  Communication with Friends and Family: Patient refused    Frequency of Social Gatherings with Friends and Family: Patient refused    Attends Religious Services: Patient refused    Active Member of Clubs or Organizations: Patient refused  Attends Archivist Meetings: Patient refused    Marital Status: Patient refused  Intimate Partner Violence: Not At Risk (09/09/2022)   Humiliation, Afraid, Rape, and Kick questionnaire    Fear of Current or Ex-Partner: No    Emotionally Abused: No    Physically Abused: No    Sexually Abused: No     Review of Systems    General:  No chills, fever, night sweats or weight changes.  Cardiovascular:  No chest pain, dyspnea on exertion, edema, orthopnea, palpitations, paroxysmal nocturnal dyspnea. Dermatological: No rash, lesions/masses Respiratory: No cough, dyspnea Urologic: No hematuria, dysuria Abdominal:   No nausea, vomiting, diarrhea, bright red blood per rectum, melena, or hematemesis Neurologic:  No visual changes, wkns, changes in mental status. All other systems reviewed and are otherwise negative except as noted above.     Physical Exam    VS:  There were no vitals taken for this visit. , BMI There is no height or weight on file to calculate BMI.     GEN: Well nourished, well developed, in no acute distress. HEENT: normal. Neck: Supple, no JVD, carotid bruits, or masses. Cardiac: RRR, no murmurs, rubs, or gallops. No clubbing, cyanosis, edema.  Radials/DP/PT 2+ and equal bilaterally.  Respiratory:  Respirations regular and unlabored, clear to auscultation bilaterally. GI: Soft, nontender, nondistended, BS + x 4. MS: no deformity or atrophy. Skin: warm and dry, no rash. Neuro:  Strength and sensation are intact. Psych: Normal affect.  Accessory Clinical Findings    ECG personally reviewed by me today- *** - No acute changes  Lab Results  Component Value Date   WBC 6.9 07/13/2022   HGB 13.2 07/13/2022   HCT 38.8  07/13/2022   MCV 90 07/13/2022   PLT 317 07/13/2022   Lab Results  Component Value Date   CREATININE 0.71 04/13/2022   BUN 14 04/13/2022   NA 144 04/13/2022   K 4.2 04/13/2022   CL 109 04/13/2022   CO2 24 04/13/2022   Lab Results  Component Value Date   ALT 22 04/13/2022   AST 24 04/13/2022   ALKPHOS 44 08/14/2018   BILITOT 0.7 04/13/2022   Lab Results  Component Value Date   CHOL 147 08/08/2021   HDL 46 (L) 08/08/2021   LDLCALC 78 08/08/2021   TRIG 126 08/08/2021   CHOLHDL 3.2 08/08/2021    Lab Results  Component Value Date   HGBA1C 5.6 04/13/2022    Review of Prior Studies: NM Stress Test 12/02/2017 The left ventricular ejection fraction is normal (55-65%). Nuclear stress EF: 63%. There was no ST segment deviation noted during stress. This is a low risk study.  Zio Monitor 08/20/2021  Patient had a min HR of 52 bpm, max HR of 160 bpm, and avg HR of 74 bpm. Predominant underlying rhythm was Sinus Rhythm. 2 Supraventricular Tachycardia runs occurred, the run with the fastest interval lasting 8 beats with a max rate of 160 bpm, the  longest lasting 9 beats with an avg rate of 119 bpm. Isolated SVEs were rare (<1.0%), SVE Couplets were rare (<1.0%), and SVE Triplets were rare (<1.0%). Isolated VEs were rare (<1.0%), VE Couplets were rare (<1.0%), and no VE Triplets were present.  Ventricular Bigeminy was present.    Sinus bradycardia, NSR, sinus tachycardia, PACs, brief PAT, rare PVC and couplet; pt triggered events associated with sinus rhythm  Assessment & Plan   1.  ***    Current medicines are reviewed at length with the patient  today.  I have spent *** min's  dedicated to the care of this patient on the date of this encounter to include pre-visit review of records, assessment, management and diagnostic testing,with shared decision making. Signed, Phill Myron. [place] Pugh, ANP, AACC   11/26/2022 9:35 AM      Office [phone] Fax [phone]  Notice:  This dictation was prepared with Dragon dictation along with smaller phrase technology. Any transcriptional errors that result from this process are unintentional and may not be corrected upon review.

## 2022-11-27 ENCOUNTER — Ambulatory Visit: Payer: Medicare HMO | Admitting: Adult Health

## 2022-12-01 DIAGNOSIS — H26492 Other secondary cataract, left eye: Secondary | ICD-10-CM | POA: Diagnosis not present

## 2023-01-14 DIAGNOSIS — M25561 Pain in right knee: Secondary | ICD-10-CM | POA: Diagnosis not present

## 2023-01-20 ENCOUNTER — Other Ambulatory Visit: Payer: Self-pay | Admitting: Adult Health

## 2023-01-20 DIAGNOSIS — I480 Paroxysmal atrial fibrillation: Secondary | ICD-10-CM

## 2023-01-20 NOTE — Telephone Encounter (Signed)
Pt last saw Dr Jens Som 10/15/21, pt is overdue for follow-up.  1 year recall in Epic. Msg sent to schedulers to contact pt for follow-up appt.  Last labs 04/13/22 Creat 0.71, age 72, weight 84.8kg, based on specified criteria pt is on appropriate dosage of Eliquis  BID for afib.  Will await appt to refill rx.

## 2023-01-25 NOTE — Telephone Encounter (Signed)
Eliquis 5mg  refill request received. Patient is 72 years old, weight-84.8kg, Crea-0.71 on 04/13/22, Diagnosis-Afib, and last seen by Dr. Jens Som on 10/15/21 and has an appointment pending with Carlos Levering on 03/05/23. Dose is appropriate based on dosing criteria. Will send in refill to requested pharmacy.

## 2023-01-28 ENCOUNTER — Telehealth: Payer: Self-pay

## 2023-01-28 ENCOUNTER — Other Ambulatory Visit: Payer: Self-pay | Admitting: Internal Medicine

## 2023-01-28 DIAGNOSIS — I480 Paroxysmal atrial fibrillation: Secondary | ICD-10-CM

## 2023-01-28 NOTE — Telephone Encounter (Signed)
I called patient to follow-up on Rx and patient is currently taking diltiazem (CARDIZEM CD) 240 MG 24 hr capsule, Rx was discontinued because patient had extra supply.  While talking with her she mentioned she had a fall on or around 4/18 at the park with her grandchild. She said she scrapped her legs and the right knee is swollen and she has a hard time getting in and out of the car.  She has been seen at [facility] and has an up coming appt 5/8,  she also scheduled an appt with PCP on 6/20 to discuss fall and medications.

## 2023-02-04 DIAGNOSIS — M25561 Pain in right knee: Secondary | ICD-10-CM | POA: Diagnosis not present

## 2023-02-05 ENCOUNTER — Other Ambulatory Visit: Payer: Self-pay | Admitting: Physician Assistant

## 2023-02-05 DIAGNOSIS — S83249A Other tear of medial meniscus, current injury, unspecified knee, initial encounter: Secondary | ICD-10-CM

## 2023-02-18 ENCOUNTER — Ambulatory Visit
Admission: RE | Admit: 2023-02-18 | Discharge: 2023-02-18 | Disposition: A | Discharge status: Home / Self Care | Payer: Medicare HMO | Source: Ambulatory Visit | Attending: Physician Assistant | Admitting: Physician Assistant

## 2023-02-18 DIAGNOSIS — M1711 Unilateral primary osteoarthritis, right knee: Secondary | ICD-10-CM | POA: Diagnosis not present

## 2023-02-18 DIAGNOSIS — S83249A Other tear of medial meniscus, current injury, unspecified knee, initial encounter: Secondary | ICD-10-CM

## 2023-02-18 DIAGNOSIS — M25561 Pain in right knee: Secondary | ICD-10-CM | POA: Diagnosis not present

## 2023-03-05 ENCOUNTER — Ambulatory Visit: Payer: Medicare HMO | Admitting: Student

## 2023-03-11 NOTE — Progress Notes (Signed)
Patient Care Team: Margaree Mackintosh, MD as PCP - General (Internal Medicine) Jens Som Madolyn Frieze, MD as PCP - Cardiology (Cardiology) Lewayne Bunting, MD as Consulting Physician (Cardiology)  Visit Date: 03/18/23  Subjective:    Patient ID: Olivia Reynolds , Female   DOB: 1951/08/12, 72 y.o.    MRN: [id number]   72 y.o. Female presents today for a fall on 4/18 she sustained while running. No concussion symptoms at the time. Seen by orthopedist same day and given two steroid injections in right knee. 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 Cruciate and collateral ligaments are intact. Quadriceps tendon and patellar tendon are intact, 4) No evidence of fracture or osteonecrosis. Knee is better but still has some mild pain.  Situational stress discussed relating to daughter and son's health issues. She has not been sleeping well.  Past Medical History:  Diagnosis Date   Chronic headaches    Depression    Diastolic dysfunction    a. 12/2013 Echo: EF 60-65%, Gr1 DD, Ao sclerosis w/o stenosis, triv MR, mod dil LA, mild TR.   Diverticulosis    GERD (gastroesophageal reflux disease)    History of hiatal hernia    with repair   History of stress test    a. 10/2008 MV: Ef 75%, nl perfusion.   HTN (hypertension)    Hyperlipidemia    Migraine    Obesity    Osteopenia    PAF (paroxysmal atrial fibrillation) (HCC)    a. Anticoagulated w/ Eliquis (CHA2DS2VASc = 3).     Family History  Problem Relation Age of Onset   COPD Mother    Asthma Mother    Arthritis Father    Hypertension Father    Stroke Father    Cancer Father    Prostate cancer Father    Hypertension Sister    Diabetes Sister    Hypertension Brother    Cancer Sister         UTERINE   Cancer Son        TESTICULAR   Crohn's disease Daughter    Pancreatic cancer Other    Colon cancer Cousin    Esophageal cancer Neg Hx    Stomach cancer Neg Hx    Liver disease Neg Hx    Rectal cancer Neg Hx     Social History   Social History Narrative   Divorced, 3 children lives alone   She is a retired Librarian, 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Public librarian   Former smoker no alcohol no drug use      Review of Systems  Constitutional:  Negative for fever and malaise/fatigue.  HENT:  Negative for congestion.   Eyes:  Negative for blurred vision.  Respiratory:  Negative for cough and shortness of breath.   Cardiovascular:  Negative for chest pain, palpitations and leg swelling.  Gastrointestinal:  Negative for vomiting.  Musculoskeletal:  Positive for joint pain (Right knee). Negative for back pain.  Skin:  Negative for rash.  Neurological:  Negative for loss of consciousness and headaches.        Objective:   Vitals: BP 138/82   Pulse 83   Temp 98.4 F (36.9 C) (Tympanic)   Ht 5\' 4"  (1.626 m)   Wt 193 lb (87.5 kg)   SpO2 97%  BMI 33.13 kg/m    Physical Exam Vitals and nursing note reviewed.  Constitutional:      General: She is not in acute distress.    Appearance: Normal appearance. She is not toxic-appearing.  HENT:     Head: Normocephalic and atraumatic.  Pulmonary:     Effort: Pulmonary effort is normal.  Skin:    General: Skin is warm and dry.  Neurological:     Mental Status: She is alert and oriented to person, place, and time. Mental status is at baseline.  Psychiatric:        Mood and Affect: Mood normal.        Behavior: Behavior normal.        Thought Content: Thought content normal.        Judgment: Judgment normal.       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 Cruciate and collateral ligaments are intact. Quadriceps tendon and patellar tendon are intact, 4) No evidence of fracture or osteonecrosis.  Labs:       Component Value Date/Time   NA 144 04/13/2022 1142   NA 143 07/25/2021 1510   K 4.2 04/13/2022 1142   CL 109 04/13/2022 1142   CO2 24 04/13/2022 1142   GLUCOSE 98 04/13/2022 1142   BUN 14 04/13/2022 1142   BUN 16 07/25/2021 1510   CREATININE 0.71 04/13/2022 1142   CALCIUM 9.0 04/13/2022 1142   PROT 6.3 04/13/2022 1142   PROT 7.0 11/23/2017 1216   ALBUMIN 3.9 08/14/2018 0026   ALBUMIN 4.6 11/23/2017 1216   AST 24 04/13/2022 1142   ALT 22 04/13/2022 1142   ALKPHOS 44 08/14/2018 0026   BILITOT 0.7 04/13/2022 1142   BILITOT 0.7 11/23/2017 1216   GFRNONAA 86 03/24/2021 1711   GFRAA 100 03/24/2021 1711     Lab Results  Component Value Date   WBC 6.9 07/13/2022   HGB 13.2 07/13/2022   HCT 38.8 07/13/2022   MCV 90 07/13/2022   PLT 317 07/13/2022    Lab Results  Component Value Date   CHOL 147 08/08/2021   HDL 46 (L) 08/08/2021   LDLCALC 78 08/08/2021   TRIG 126 08/08/2021   CHOLHDL 3.2 08/08/2021    Lab Results  Component Value Date   HGBA1C 5.6 04/13/2022     Lab Results  Component Value Date   TSH 1.54 04/13/2022      Assessment & Plan:   Right knee pain: per orthopedic. Will continue to monitor.  Insomnia: prescribed lorazepam 2 mg at bedtime.   Depression: treated with bupropion 300 mg daily. Stress has been increased recently. Prescribed sertraline 50 mg daily.  May benefit from Counseling for situational stress  45 minutes spent with patient  I,Alexander Ruley,acting as a scribe for Margaree Mackintosh, MD.,have documented all relevant documentation on the behalf of Margaree Mackintosh, MD,as directed by  Margaree Mackintosh, MD while in the presence of Margaree Mackintosh,  MD.   I, Margaree Mackintosh, MD, have reviewed all documentation for this visit. The documentation on 03/27/23 for the exam, diagnosis, procedures, and orders are all accurate and complete.

## 2023-03-15 ENCOUNTER — Telehealth: Payer: Self-pay

## 2023-03-15 NOTE — Patient Outreach (Signed)
  Care Coordination   03/15/2023 Name: Olivia Reynolds MRN: [id number] DOB: [birth year]   Care Coordination Outreach Attempts:  An unsuccessful telephone outreach was attempted today to offer the patient information about available care coordination services.  Follow Up Plan:  Additional outreach attempts will be made to offer the patient care coordination information and services.   Encounter Outcome:  No Answer   Care Coordination Interventions:  No, not indicated    Bevelyn Ngo, BSW, CDP Social Worker, Certified Dementia Practitioner [facility] Care Management  Care Coordination [phone]

## 2023-03-18 ENCOUNTER — Encounter: Payer: Self-pay | Admitting: Internal Medicine

## 2023-03-18 ENCOUNTER — Ambulatory Visit (INDEPENDENT_AMBULATORY_CARE_PROVIDER_SITE_OTHER): Payer: Medicare HMO | Admitting: Internal Medicine

## 2023-03-18 VITALS — BP 138/82 | HR 83 | Temp 98.4°F | Ht 64.0 in | Wt 193.0 lb

## 2023-03-18 DIAGNOSIS — R7301 Impaired fasting glucose: Secondary | ICD-10-CM | POA: Diagnosis not present

## 2023-03-18 DIAGNOSIS — I480 Paroxysmal atrial fibrillation: Secondary | ICD-10-CM

## 2023-03-18 DIAGNOSIS — G4700 Insomnia, unspecified: Secondary | ICD-10-CM

## 2023-03-18 DIAGNOSIS — R5383 Other fatigue: Secondary | ICD-10-CM

## 2023-03-18 DIAGNOSIS — I10 Essential (primary) hypertension: Secondary | ICD-10-CM

## 2023-03-18 DIAGNOSIS — Z8669 Personal history of other diseases of the nervous system and sense organs: Secondary | ICD-10-CM

## 2023-03-18 DIAGNOSIS — F5102 Adjustment insomnia: Secondary | ICD-10-CM

## 2023-03-18 DIAGNOSIS — Z7901 Long term (current) use of anticoagulants: Secondary | ICD-10-CM | POA: Diagnosis not present

## 2023-03-18 DIAGNOSIS — F439 Reaction to severe stress, unspecified: Secondary | ICD-10-CM

## 2023-03-18 DIAGNOSIS — W19XXXD Unspecified fall, subsequent encounter: Secondary | ICD-10-CM | POA: Diagnosis not present

## 2023-03-18 DIAGNOSIS — M25561 Pain in right knee: Secondary | ICD-10-CM

## 2023-03-18 DIAGNOSIS — K219 Gastro-esophageal reflux disease without esophagitis: Secondary | ICD-10-CM

## 2023-03-18 MED ORDER — SERTRALINE HCL 50 MG PO TABS: 50.0000 mg | ORAL_TABLET | Freq: Every day | ORAL | 3 refills | Status: DC

## 2023-03-18 MED ORDER — LORAZEPAM 2 MG PO TABS: 2.0000 mg | ORAL_TABLET | Freq: Every evening | ORAL | 2 refills | Status: DC

## 2023-03-19 LAB — HEMOGLOBIN A1C
Hgb A1c MFr Bld: 5.6 % of total Hgb (ref <5.7)
Mean Plasma Glucose: 114 mg/dL
eAG (mmol/L): 6.3 mmol/L

## 2023-03-19 LAB — TSH: TSH: 2.35 mIU/L (ref 0.40–4.50)

## 2023-03-22 ENCOUNTER — Telehealth: Payer: Self-pay

## 2023-03-22 NOTE — Telephone Encounter (Signed)
Patient needs prior authorization for her lorazepam 2 mg.  Key: [id number] Last name: Pintor DOB 01-19-2051

## 2023-03-23 ENCOUNTER — Telehealth: Payer: Self-pay

## 2023-03-23 NOTE — Telephone Encounter (Signed)
PA has been APPROVED from 03/23/2023-09/27/2023

## 2023-03-23 NOTE — Patient Outreach (Signed)
  Care Coordination   03/23/2023 Name: Olivia Reynolds MRN: [id number] DOB: 1951/01/22   Care Coordination Outreach Attempts:  A second unsuccessful outreach was attempted today to offer the patient with information about available care coordination services.  Follow Up Plan:  Additional outreach attempts will be made to offer the patient care coordination information and services.   Encounter Outcome:  No Answer   Care Coordination Interventions:  No, not indicated    Bevelyn Ngo, BSW, CDP Social Worker, Certified Dementia Practitioner [facility] Care Management  Care Coordination [phone]

## 2023-03-23 NOTE — Telephone Encounter (Signed)
PA submitted via CMM. Created new encounter for PA. Will route back to pool once determination has been made. 

## 2023-03-23 NOTE — Telephone Encounter (Signed)
Pharmacy Patient Advocate Encounter   Received notification from [facility] that prior authorization for LORazepam 2MG  tablets is required/requested.   PA submitted to [facility] via CoverMyMeds Key or (Medicaid) confirmation # [id number] Status is pending

## 2023-03-25 ENCOUNTER — Ambulatory Visit: Payer: Self-pay

## 2023-03-25 NOTE — Patient Instructions (Signed)
Visit Information  Thank you for taking time to visit with me today. Please don't hesitate to contact me if I can be of assistance to you.   Following are the goals we discussed today:  - Contact your primary care provider as needed   If you are experiencing a Mental Health or Behavioral Health Crisis or need someone to talk to, please go to [facility] [address] [place] [phone]) call 911  Patient verbalizes understanding of instructions and care plan provided today and agrees to view in MyChart. Active MyChart status and patient understanding of how to access instructions and care plan via MyChart confirmed with patient.     No further follow up required: Please contact me as needed.  Bevelyn Ngo, BSW, CDP Social Worker, Certified Dementia Practitioner [facility] Care Management  Care Coordination [phone]

## 2023-03-25 NOTE — Patient Outreach (Signed)
  Care Coordination   Initial Visit Note   03/25/2023 Name: Olivia Reynolds MRN: [id number] DOB: December 08, 1950  Olivia Reynolds is a 72 y.o. year old female who sees Baxley, Luanna Cole, MD for primary care. I spoke with  Olivia Reynolds by phone today.  What matters to the patients health and wellness today?  No concerns at this time, patient reports she is doing well at home.    Goals Addressed             This Visit's Progress    COMPLETED: Care Coordination Activities       Care Coordination Interventions: SDoH screening reviewed - no acute resource challenges  Discussed the patient feels she has a good support system from her family and church Education provided on the role of the care coordination team - no follow up desired at this time Encouraged the patient to contact her primary care provider as needed         SDOH assessments and interventions completed:  Yes  SDOH Interventions Today    Flowsheet Row Most Recent Value  SDOH Interventions   Food Insecurity Interventions Intervention Not Indicated  Housing Interventions Intervention Not Indicated  Transportation Interventions Intervention Not Indicated  Utilities Interventions Intervention Not Indicated  Financial Strain Interventions Intervention Not Indicated        Care Coordination Interventions:  Yes, provided   Interventions Today    Flowsheet Row Most Recent Value  Chronic Disease   Chronic disease during today's visit Hypertension (HTN)  General Interventions   General Interventions Discussed/Reviewed General Interventions Discussed, Doctor Visits  Doctor Visits Discussed/Reviewed Doctor Visits Reviewed  Education Interventions   Education Provided Provided Education  [role of the care coordination team]        Follow up plan: No further intervention required.   Encounter Outcome:  Pt. Visit Completed   Bevelyn Ngo, BSW, CDP Social Worker, Certified Dementia Practitioner [facility] Care  Management  Care Coordination [phone]

## 2023-03-27 NOTE — Patient Instructions (Addendum)
Patient will continue close follow-up regarding knee pain/injury with orthopedist.  Have prescribed lorazepam 2 mg at bedtime for insomnia.  Situational stress discussed at length.  May benefit from counseling.  Sertraline 50 mg daily added to bupropion 300 mg daily for depression and situational stress.  Hemoglobin A1c is excellent at 5.6%.  TSH is normal.

## 2023-03-30 DIAGNOSIS — M5416 Radiculopathy, lumbar region: Secondary | ICD-10-CM | POA: Diagnosis not present

## 2023-04-28 ENCOUNTER — Other Ambulatory Visit: Payer: Self-pay | Admitting: Internal Medicine

## 2023-04-28 ENCOUNTER — Other Ambulatory Visit: Payer: Self-pay

## 2023-04-28 ENCOUNTER — Other Ambulatory Visit: Payer: Self-pay | Admitting: Cardiology

## 2023-04-28 MED ORDER — METOPROLOL SUCCINATE ER 25 MG PO TB24: 12.5000 mg | ORAL_TABLET | Freq: Every day | ORAL | 0 refills | Status: DC

## 2023-05-07 ENCOUNTER — Telehealth: Payer: Self-pay | Admitting: Cardiology

## 2023-05-07 NOTE — Telephone Encounter (Signed)
*  STAT* If patient is at the pharmacy, call can be transferred to refill team.   1. Which medications need to be refilled? (please list name of each medication and dose if known)   metoprolol succinate (TOPROL-XL) 25 MG 24 hr tablet   2. Would you like to learn more about the convenience, safety, & potential cost savings by using the [facility]?    3. Are you open to using the [facility] (Type [facility]. ).   4. Which pharmacy/location (including street and city if local pharmacy) is medication to be sent to?  [id number] - [place], [place] - [address] AT [facility] [address]    5. Do they need a 30 day or 90 day supply?   30 day  Patient stated she is almost out of this medication.  Patient has appointment scheduled on 8/20.

## 2023-05-10 MED ORDER — METOPROLOL SUCCINATE ER 25 MG PO TB24: 12.5000 mg | ORAL_TABLET | Freq: Every day | ORAL | 0 refills | Status: DC

## 2023-05-10 NOTE — Progress Notes (Signed)
HPI: FU atrial fibrillation. Last echocardiogram April 2018 showed normal LV function, mild left ventricular hypertrophy, grade 1 diastolic dysfunction.  Nuclear study March 2019 showed ejection fraction 63% and probable shifting breast attenuation.  Monitor November 2022 showed sinus rhythm with PACs, brief PAT, rare PVC and couplet.  Patient triggered events associated with sinus rhythm.  Since I last saw her, she notes increased dyspnea on exertion but no orthopnea or PND.  Occasional minimal pedal edema.  No chest pain.  She also feels occasional palpitations.  Current Outpatient Medications  Medication Sig Dispense Refill   acetaminophen (TYLENOL) 500 MG tablet Take 500 mg by mouth every 6 (six) hours as needed.     apixaban (ELIQUIS) 5 MG TABS tablet TAKE 1 TABLET(5 MG) BY MOUTH TWICE DAILY 180 tablet 0   buPROPion (WELLBUTRIN XL) 300 MG 24 hr tablet Take 300 mg by mouth every morning.     diltiazem (CARDIZEM CD) 240 MG 24 hr capsule TAKE 1 CAPSULE(240 MG) BY MOUTH DAILY 90 capsule 1   LORazepam (ATIVAN) 2 MG tablet Take 1 tablet (2 mg total) by mouth Nightly. 30 tablet 2   metoprolol succinate (TOPROL-XL) 25 MG 24 hr tablet Take 0.5 tablets (12.5 mg total) by mouth daily. 45 tablet 0   mirtazapine (REMERON) 15 MG tablet TAKE 1 TABLET(15 MG) BY MOUTH AT BEDTIME 30 tablet 1   pantoprazole (PROTONIX) 40 MG tablet TAKE 1 TABLET(40 MG) BY MOUTH TWICE DAILY 60 tablet 4   rosuvastatin (CRESTOR) 20 MG tablet TAKE 1 TABLET BY MOUTH EVERY DAY 90 tablet 3   sertraline (ZOLOFT) 50 MG tablet Take 1 tablet (50 mg total) by mouth daily. 90 tablet 3   temazepam (RESTORIL) 15 MG capsule Take 30 mg by mouth at bedtime as needed.     No current facility-administered medications for this visit.     Past Medical History:  Diagnosis Date   Chronic headaches    Depression    Diastolic dysfunction    a. 12/2013 Echo: EF 60-65%, Gr1 DD, Ao sclerosis w/o stenosis, triv MR, mod dil LA, mild TR.    Diverticulosis    GERD (gastroesophageal reflux disease)    History of hiatal hernia    with repair   History of stress test    a. 10/2008 MV: Ef 75%, nl perfusion.   HTN (hypertension)    Hyperlipidemia    Migraine    Obesity    Osteopenia    PAF (paroxysmal atrial fibrillation) (HCC)    a. Anticoagulated w/ Eliquis (CHA2DS2VASc = 3).    Past Surgical History:  Procedure Laterality Date   CATARACT EXTRACTION, BILATERAL     COLONOSCOPY     ELBOW SURGERY  left   Tendon Surgery   ESOPHAGEAL MANOMETRY N/A 02/23/2018   Procedure: ESOPHAGEAL MANOMETRY (EM);  Surgeon: Napoleon Form, MD;  Location: [facility];  Service: Endoscopy;  Laterality: N/A;   EYE SURGERY Left    L-yey myopia   HERNIA REPAIR  2008   nissan fundiplication   KNEE ARTHROSCOPY  2005   R-knee   LAPAROSCOPIC NISSEN FUNDOPLICATION     ROTATOR CUFF REPAIR Right    TONSILLECTOMY     TUBAL LIGATION     UPPER GASTROINTESTINAL ENDOSCOPY      Social History   Socioeconomic History   Marital status: Divorced    Spouse name: Not on file   Number of children: 3   Years of education: Not on file   Highest  education level: Not on file  Occupational History   Occupation: [employer]    Comment: retired  Tobacco Use   Smoking status: Former    Current packs/day: 0.00    Types: Cigarettes    Quit date: 09/07/1971    Years since quitting: 51.7   Smokeless tobacco: Never  Vaping Use   Vaping status: Never Used  Substance and Sexual Activity   Alcohol use: Not Currently   Drug use: No   Sexual activity: Not on file  Other Topics Concern   Not on file  Social History Narrative   Divorced, 3 children lives alone   She is a retired Librarian, 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[employer]   Former smoker no alcohol no drug use   Social Determinants of Corporate investment banker Strain: Low Risk  (03/25/2023)   Overall Financial Resource Strain (CARDIA)    Difficulty of Paying Living  Expenses: Not very hard  Food Insecurity: No Food Insecurity (03/25/2023)   Hunger Vital Sign    Worried About Running Out of Food in the Last Year: Never true    Ran Out of Food in the Last Year: Never true  Transportation Needs: No Transportation Needs (03/25/2023)   PRAPARE - Administrator, Civil Service (Medical): No    Lack of Transportation (Non-Medical): No  Physical Activity: Insufficiently Active (03/17/2023)   Exercise Vital Sign    Days of Exercise per Week: 1 day    Minutes of Exercise per Session: 20 min  Stress: Stress Concern Present (03/17/2023)   [employer] of Occupational Health - Occupational Stress Questionnaire    Feeling of Stress : Rather much  Social Connections: Moderately Integrated (03/17/2023)   Social Connection and Isolation Panel [NHANES]    Frequency of Communication with Friends and Family: More than three times a week    Frequency of Social Gatherings with Friends and Family: Three times a week    Attends Religious Services: More than 4 times per year    Active Member of Clubs or Organizations: Yes    Attends Banker Meetings: More than 4 times per year    Marital Status: Divorced  Intimate Partner Violence: Not At Risk (09/09/2022)   Humiliation, Afraid, Rape, and Kick questionnaire    Fear of Current or Ex-Partner: No    Emotionally Abused: No    Physically Abused: No    Sexually Abused: No    Family History  Problem Relation Age of Onset   COPD Mother    Asthma Mother    Arthritis Father    Hypertension Father    Stroke Father    Cancer Father    Prostate cancer Father    Hypertension Sister    Diabetes Sister    Hypertension Brother    Cancer Sister        UTERINE   Cancer Son        TESTICULAR   Crohn's disease Daughter    Pancreatic cancer Other    Colon cancer Cousin    Esophageal cancer Neg Hx    Stomach cancer Neg Hx    Liver disease Neg Hx    Rectal cancer Neg Hx     ROS: no fevers or  chills, productive cough, hemoptysis, dysphasia, odynophagia, melena, hematochezia, dysuria, hematuria, rash, seizure activity, orthopnea, PND, pedal edema, claudication. Remaining systems are negative.  Physical Exam: Well-developed well-nourished in no acute distress.  Skin is warm and dry.  HEENT  is normal.  Neck is supple.  Chest is clear to auscultation with normal expansion.  Cardiovascular exam is regular rate and rhythm.  Abdominal exam nontender or distended. No masses palpated. Extremities show no edema. neuro grossly intact  EKG Interpretation Date/Time:  Tuesday May 18 2023 15:14:56 EDT Ventricular Rate:  79 PR Interval:  120 QRS Duration:  92 QT Interval:  388 QTC Calculation: 444 R Axis:   22  Text Interpretation: Normal sinus rhythm Nonspecific ST abnormality No previous ECGs available Confirmed by Olga Millers ([id number]) on 05/18/2023 3:16:13 PM =  A/P  1 paroxysmal atrial fibrillation-patient remains in sinus rhythm today.  Will continue metoprolol and Cardizem at present dose for rate control if atrial fibrillation recurs.  Continue apixaban.  Note she did feel as though she was in atrial fibrillation over the past several days but her electrocardiogram does not confirm this today.  We discussed purchasing a smart watch to record any strips associated with her symptoms.  2 chronic diastolic congestive heart failure-will continue diuretic as needed.  3 hypertension-blood pressure controlled.  Continue present medications and follow.  4 hyperlipidemia-continue statin.  5 dyspnea on exertion-increased dyspnea.  Will arrange echocardiogram to reassess LV function.  Olga Millers, MD

## 2023-05-10 NOTE — Telephone Encounter (Signed)
Pt's medication was sent to pt's pharmacy as requested. Confirmation received.  °

## 2023-05-18 ENCOUNTER — Encounter: Payer: Self-pay | Admitting: Cardiology

## 2023-05-18 ENCOUNTER — Ambulatory Visit: Payer: Medicare HMO | Attending: Cardiology | Admitting: Cardiology

## 2023-05-18 VITALS — BP 148/80 | HR 79 | Ht 65.0 in | Wt 182.0 lb

## 2023-05-18 DIAGNOSIS — R0602 Shortness of breath: Secondary | ICD-10-CM

## 2023-05-18 DIAGNOSIS — I5032 Chronic diastolic (congestive) heart failure: Secondary | ICD-10-CM | POA: Diagnosis not present

## 2023-05-18 DIAGNOSIS — E7800 Pure hypercholesterolemia, unspecified: Secondary | ICD-10-CM | POA: Diagnosis not present

## 2023-05-18 DIAGNOSIS — I480 Paroxysmal atrial fibrillation: Secondary | ICD-10-CM | POA: Diagnosis not present

## 2023-05-18 DIAGNOSIS — I10 Essential (primary) hypertension: Secondary | ICD-10-CM | POA: Diagnosis not present

## 2023-05-18 NOTE — Patient Instructions (Signed)
  Testing/Procedures:  Your physician has requested that you have an echocardiogram. Echocardiography is a painless test that uses sound waves to create images of your heart. It provides your doctor with information about the size and shape of your heart and how well your heart's chambers and valves are working. This procedure takes approximately one hour. There are no restrictions for this procedure. Please do NOT wear cologne, perfume, aftershave, or lotions (deodorant is allowed). Please arrive 15 minutes prior to your appointment time. [place], you and your health needs are our priority.  As part of our continuing mission to provide you with exceptional heart care, we have created designated Provider Care Teams.  These Care Teams include your primary Cardiologist (physician) and Advanced Practice Providers (APPs -  Physician Assistants and Nurse Practitioners) who all work together to provide you with the care you need, when you need it.  We recommend signing up for the patient portal called "MyChart".  Sign up information is provided on this After Visit Summary.  MyChart is used to connect with patients for Virtual Visits (Telemedicine).  Patients are able to view lab/test results, encounter notes, upcoming appointments, etc.  Non-urgent messages can be sent to your provider as well.   To learn more about what you can do with MyChart, go to [email or web address].    Your next appointment:   6 month(s)  Provider:   Kirk Ruths, MD

## 2023-05-20 ENCOUNTER — Encounter: Payer: Self-pay | Admitting: Cardiology

## 2023-05-20 MED ORDER — METOPROLOL SUCCINATE ER 25 MG PO TB24: 37.5000 mg | ORAL_TABLET | Freq: Every day | ORAL | 3 refills | Status: DC

## 2023-05-20 NOTE — Telephone Encounter (Signed)
Please clarify patient metoprolol dose.  Per the note and med list it is 25 mg take 1/2 tablet daily.  She states taking 1 and 1/2 tablet daily.  Pharmacy holding refill for clarification

## 2023-05-21 ENCOUNTER — Other Ambulatory Visit: Payer: Self-pay

## 2023-05-21 MED ORDER — METOPROLOL SUCCINATE ER 25 MG PO TB24: 37.5000 mg | ORAL_TABLET | Freq: Every day | ORAL | 3 refills | Status: DC

## 2023-05-21 NOTE — Progress Notes (Unsigned)
Resent Metoprolol to the pharmacy.

## 2023-05-26 DIAGNOSIS — F4323 Adjustment disorder with mixed anxiety and depressed mood: Secondary | ICD-10-CM | POA: Diagnosis not present

## 2023-05-26 DIAGNOSIS — F411 Generalized anxiety disorder: Secondary | ICD-10-CM | POA: Diagnosis not present

## 2023-05-28 ENCOUNTER — Encounter: Payer: Self-pay | Admitting: Internal Medicine

## 2023-06-11 ENCOUNTER — Ambulatory Visit (HOSPITAL_COMMUNITY): Payer: Medicare HMO | Attending: Cardiology

## 2023-06-11 DIAGNOSIS — R0602 Shortness of breath: Secondary | ICD-10-CM | POA: Diagnosis not present

## 2023-06-11 LAB — ECHOCARDIOGRAM COMPLETE
Area-P 1/2: 3.12 cm2
S' Lateral: 2.6 cm

## 2023-06-14 ENCOUNTER — Other Ambulatory Visit: Payer: Self-pay | Admitting: Internal Medicine

## 2023-06-14 DIAGNOSIS — I480 Paroxysmal atrial fibrillation: Secondary | ICD-10-CM

## 2023-06-15 ENCOUNTER — Encounter: Payer: Self-pay | Admitting: Internal Medicine

## 2023-06-15 ENCOUNTER — Other Ambulatory Visit: Payer: Self-pay

## 2023-06-15 DIAGNOSIS — I480 Paroxysmal atrial fibrillation: Secondary | ICD-10-CM

## 2023-06-15 MED ORDER — APIXABAN 5 MG PO TABS: ORAL_TABLET | ORAL | 3 refills | Status: DC

## 2023-06-15 MED ORDER — APIXABAN 5 MG PO TABS: ORAL_TABLET | ORAL | 0 refills | Status: DC

## 2023-06-15 NOTE — Telephone Encounter (Signed)
Pt last saw Dr Jens Som 05/18/23, last labs 04/13/22 Creat 0.71, pt is overdue for labwork.  Pt has upcoming appt on 09/24/23 for labwork prior to medicare wellness visit on 09/27/23 with PCP.  Age 72, weight 82.6kg, based on specified criteria pt is on appropriate dosage of Eliquis 5mg  BID for afib. Will refill rx to get pt to upcoming appt with PCP for labwork.

## 2023-06-15 NOTE — Telephone Encounter (Signed)
Prescription refill request for Eliquis received. Indication:afib Last office visit:8/24 NFA:[id number] labs Age: 72 Weight:82.6  kg  Prescription refilled

## 2023-07-01 DIAGNOSIS — Z1231 Encounter for screening mammogram for malignant neoplasm of breast: Secondary | ICD-10-CM | POA: Diagnosis not present

## 2023-07-01 LAB — HM MAMMOGRAPHY

## 2023-07-06 ENCOUNTER — Other Ambulatory Visit: Payer: Self-pay | Admitting: Gastroenterology

## 2023-09-07 ENCOUNTER — Telehealth: Payer: Self-pay | Admitting: Internal Medicine

## 2023-09-07 NOTE — Telephone Encounter (Signed)
Does patient need an appointment to discuss what she is needing genetic testing for, to send in a referral for?

## 2023-09-07 NOTE — Telephone Encounter (Signed)
Copied from CRM [id number]. Topic: Clinical - Lab/Test Results >> Sep 07, 2023 11:21 AM Adelina Mings wrote: Reason for CRM: patient is requesting a genetic testing

## 2023-09-24 ENCOUNTER — Other Ambulatory Visit: Payer: Medicare HMO

## 2023-09-24 NOTE — Telephone Encounter (Signed)
Copied from CRM [id number]. Topic: Clinical - Medical Advice >> Sep 23, 2023 10:59 AM Shelah Lewandowsky wrote: Reason for CRM: Patient may need to reschedule labs due to possible today due to headache and nausea.  Please call patient [phone].  System not allowing to reschedule lab.

## 2023-09-24 NOTE — Telephone Encounter (Signed)
Appointments have been canceled and patient will call back to reschedule

## 2023-09-27 ENCOUNTER — Ambulatory Visit: Payer: Medicare HMO | Admitting: Internal Medicine

## 2023-09-30 ENCOUNTER — Telehealth: Payer: Self-pay | Admitting: Cardiology

## 2023-09-30 NOTE — Telephone Encounter (Signed)
 Called patient with no answer. Left message to return call.

## 2023-09-30 NOTE — Telephone Encounter (Signed)
 Patient is requesting call back to discuss paperwork that she needs to have completed in order for her to be able to have a procedure done to help her ill daughter. Requesting call back to give further information.

## 2023-10-01 NOTE — Telephone Encounter (Signed)
Called patient with no answer. Left VM to return call.

## 2023-10-01 NOTE — Telephone Encounter (Signed)
 Patient was returning phone call

## 2023-10-01 NOTE — Telephone Encounter (Signed)
 Called and spoke to patient. Verified name and DOB. Patient is asking if Dr Pietro can fill our paperwork for her to have genetic testing. 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. They are asking for mom and dad to have genetic testing to see if they are carriers. She reached out to her PCP and she was unavailable due to the holiday. She need testing done by January 11 th so they they can have it done for free. She need paperwork faxed back to the genetic testing place by 1/6. Advised her Dr Pietro is out of the office today but I would send him a message. She stated that most of the paperwork is filled out it just needs his office name, specimen type NPI and signature. Please advise.

## 2023-10-04 NOTE — Telephone Encounter (Signed)
 Patient did return call. She will bring papers by today and drop them off.

## 2023-10-04 NOTE — Telephone Encounter (Signed)
 Called patient with no answer. Left voicemail to call back.

## 2023-10-05 ENCOUNTER — Telehealth: Payer: Self-pay | Admitting: Cardiology

## 2023-10-05 NOTE — Telephone Encounter (Signed)
Paperwork placed at the front desk for patient pick up

## 2023-10-05 NOTE — Telephone Encounter (Signed)
 Patient stated she is returning RN Debra's call regarding picking up her paperwork today.  Patient stated she can pick it up today around 3:00 pm and thanks her very much.

## 2023-10-05 NOTE — Telephone Encounter (Signed)
 Left message for patient that paperwork has been faxed. She is to let me know if she wants this paperwork back.

## 2023-10-06 ENCOUNTER — Telehealth: Payer: Self-pay | Admitting: Cardiology

## 2023-10-06 NOTE — Telephone Encounter (Signed)
 Pt is requesting a callback from nurse Stanton Kidney regarding her not being able to come in to pick up the paperwork left at the front desk for her due to her having headaches. She'd like to know if it can be mailed to her. Please advise

## 2023-10-06 NOTE — Telephone Encounter (Signed)
 Left message for patient will place paperwork in the mail to her tomorrow.

## 2023-11-06 ENCOUNTER — Other Ambulatory Visit: Payer: Self-pay | Admitting: Cardiology

## 2023-11-06 DIAGNOSIS — I480 Paroxysmal atrial fibrillation: Secondary | ICD-10-CM

## 2023-11-08 NOTE — Telephone Encounter (Signed)
Prescription refill request for Eliquis received. Indication:afib Last office visit:8/24 NFA:[id number] labs Age: 73 Weight:82.6  kg  Prescription refilled

## 2023-11-23 ENCOUNTER — Encounter: Payer: Self-pay | Admitting: Internal Medicine

## 2023-11-23 ENCOUNTER — Ambulatory Visit (INDEPENDENT_AMBULATORY_CARE_PROVIDER_SITE_OTHER): Payer: Medicare HMO | Admitting: Internal Medicine

## 2023-11-23 VITALS — BP 110/80 | HR 74 | Temp 99.5°F | Ht 65.0 in | Wt 182.0 lb

## 2023-11-23 DIAGNOSIS — I480 Paroxysmal atrial fibrillation: Secondary | ICD-10-CM

## 2023-11-23 DIAGNOSIS — F5102 Adjustment insomnia: Secondary | ICD-10-CM | POA: Diagnosis not present

## 2023-11-23 DIAGNOSIS — J22 Unspecified acute lower respiratory infection: Secondary | ICD-10-CM | POA: Diagnosis not present

## 2023-11-23 DIAGNOSIS — F439 Reaction to severe stress, unspecified: Secondary | ICD-10-CM

## 2023-11-23 MED ORDER — HYDROCODONE BIT-HOMATROP MBR 5-1.5 MG/5ML PO SOLN: 5.0000 mL | Freq: Three times a day (TID) | ORAL | 0 refills | Status: DC | PRN

## 2023-11-23 MED ORDER — PREDNISONE 10 MG PO TABS: ORAL_TABLET | ORAL | 0 refills | Status: DC

## 2023-11-23 MED ORDER — AZITHROMYCIN 250 MG PO TABS: ORAL_TABLET | ORAL | 0 refills | Status: AC

## 2023-11-23 NOTE — Progress Notes (Signed)
 Patient Care Team: Margaree Mackintosh, MD as PCP - General (Internal Medicine) Jens Som Madolyn Frieze, MD as PCP - Cardiology (Cardiology) Lewayne Bunting, MD as Consulting Physician (Cardiology)  Visit Date: 11/23/23  Subjective:   Chief Complaint  Patient presents with   Cough   Headache  Patient ZO:XWRUE Olivia Reynolds, Olivia Reynolds DOB:Dec 18, 1950,72 y.o. AVW:[id number]  73 y.o. Female presents today for acute sick visit with Cough, Headache, and Fatigue. Reports that 3 weeks ago she was exposed to Influenza A via her grandson and is not UTD on her vaccine. States that shortly afterwards she was ill with respiratory symptoms and did have some diarrhea the first week, but felt that she was improving after that week until she got stuck out in the rain and her respiratory symptoms worsened. Describes phlegm as initially yellow-tinged and last week was bubbly. Notes that she is having difficulty sleeping due to night-time cough and when breathing she sounds "like a whistle". Denies fever/chills.   Past Medical History:  Diagnosis Date   Chronic headaches    Depression    Diastolic dysfunction    a. 12/2013 Echo: EF 60-65%, Gr1 DD, Ao sclerosis w/o stenosis, triv MR, mod dil LA, mild TR.   Diverticulosis    GERD (gastroesophageal reflux disease)    History of hiatal hernia    with repair   History of stress test    a. 10/2008 MV: Ef 75%, nl perfusion.   HTN (hypertension)    Hyperlipidemia    Migraine    Obesity    Osteopenia    PAF (paroxysmal atrial fibrillation) (HCC)    a. Anticoagulated w/ Eliquis (CHA2DS2VASc = 3).    Allergies  Allergen Reactions   Erythromycin Hives   Statins     Lipitor   Codeine Hives   Hctz [Hydrochlorothiazide] Rash   Lipitor [Atorvastatin] Other (See Comments)    Joint pain    Family History  Problem Relation Age of Onset   COPD Mother    Asthma Mother    Arthritis Father    Hypertension Father    Stroke Father    Cancer Father    Prostate cancer Father     Hypertension Sister    Diabetes Sister    Hypertension Brother    Cancer Sister        UTERINE   Cancer Son        TESTICULAR   Crohn's disease Daughter    Pancreatic cancer Other    Colon cancer Cousin    Esophageal cancer Neg Hx    Stomach cancer Neg Hx    Liver disease Neg Hx    Rectal cancer Neg Hx    Social History   Social History Narrative   Divorced, 3 children lives alone   She is a retired Librarian, 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  HENT:  Positive for congestion and ear pain (Right).   Respiratory:  Positive for cough and sputum production (yellow-tinged to bubbly).   Gastrointestinal:  Positive for diarrhea, nausea and vomiting.  Neurological:  Positive for headaches.     Objective:  Vitals: BP 110/80   Pulse 74   Temp 99.5 F (37.5 C)   Ht 5\' 5"  (1.651 m)   Wt 182 lb (82.6 kg)   SpO2 96%   BMI 30.29 kg/m  Physical Exam Vitals and nursing note reviewed.  Constitutional:      General: She is not in acute distress.    Appearance: Normal appearance. She is not toxic-appearing.  HENT:     Head: Normocephalic and atraumatic.     Right Ear: Hearing normal. Tympanic membrane is injected (full, splayed light reflex).     Left Ear: Hearing, tympanic membrane, ear canal and external ear normal.     Mouth/Throat:     Pharynx: Oropharynx is clear. No oropharyngeal exudate or posterior oropharyngeal erythema.  Pulmonary:     Effort: Pulmonary effort is normal.     Comments: Some coarse breath sounds Skin:    General: Skin is warm and dry.  Neurological:     Mental Status: She is alert and oriented to person, place, and time. Mental status is at baseline.  Psychiatric:        Mood and Affect: Mood normal.        Behavior: Behavior normal.        Thought Content: Thought content normal.        Judgment: Judgment normal.     Results:   Studies Obtained And Personally Reviewed By Me: Labs:     Component Value Date/Time   NA 144 04/13/2022 1142   NA 143 07/25/2021 1510   K 4.2 04/13/2022 1142   CL 109 04/13/2022 1142   CO2 24 04/13/2022 1142   GLUCOSE 98 04/13/2022 1142   BUN 14 04/13/2022 1142   BUN 16 07/25/2021 1510   CREATININE 0.71 04/13/2022 1142   CALCIUM 9.0 04/13/2022 1142   PROT 6.3 04/13/2022 1142   PROT 7.0 11/23/2017 1216   ALBUMIN 3.9 08/14/2018 0026   ALBUMIN 4.6 11/23/2017 1216   AST 24 04/13/2022 1142   ALT 22 04/13/2022 1142   ALKPHOS 44 08/14/2018 0026   BILITOT 0.7 04/13/2022 1142   BILITOT 0.7 11/23/2017 1216   GFRNONAA 86 03/24/2021 1711   GFRAA 100 03/24/2021 1711    Lab Results  Component Value Date   WBC 6.9 07/13/2022   HGB 13.2 07/13/2022   HCT 38.8 07/13/2022   MCV 90 07/13/2022   PLT 317 07/13/2022   Lab Results  Component Value Date   CHOL 147 08/08/2021   HDL 46 (L) 08/08/2021   LDLCALC 78 08/08/2021   TRIG 126 08/08/2021   CHOLHDL 3.2 08/08/2021   Lab Results  Component Value Date   HGBA1C 5.6 03/18/2023    Lab Results  Component Value Date   TSH 2.35 03/18/2023    Assessment & Plan:   Acute Lower Respiratory Infection: Sending in 250 mg Azithromycin - take 2 tablets on Day 1 and 1 tablet on Days 2-5, Hycodan syrup for cough - take 1 teaspoon every 8 hours as needed, and 10 mg Prednisone tapering course 6-5-4-3-2-1. Stay well rested, well hydrated, and well nourished. Contact us if symptoms worsen/persist despite treatment.   Situational stress with family member having illness. Patient is on Zoloft and Wellbutrin.  Insomnia treated with Restoril  Hx of PAF seen by Cardiology and stable. On chronic anticoagulation   I,Emily Lagle,acting as a scribe for Margaree Mackintosh, MD.,have documented all relevant documentation on the behalf of Margaree Mackintosh, MD,as directed by  Margaree Mackintosh, MD while in the presence of Margaree Mackintosh, MD.   I, Margaree Mackintosh, MD, have  reviewed all documentation for this visit. The documentation on 11/25/23 for the exam, diagnosis, procedures, and orders are all accurate  and complete.

## 2023-11-25 NOTE — Patient Instructions (Addendum)
 It was a pleasure to see you today.  Sending in Zithromax Z-PAK take as directed.  Hycodan syrup 1 teaspoon every 8 hours as needed for cough and tapering course of prednisone starting with 6x 10 mg tablets daily and decrease by 1 tablet daily.  Rest and stay well-hydrated.  Call if not improving within a few days.

## 2023-12-19 ENCOUNTER — Other Ambulatory Visit: Payer: Self-pay | Admitting: Cardiology

## 2023-12-19 DIAGNOSIS — I480 Paroxysmal atrial fibrillation: Secondary | ICD-10-CM

## 2023-12-20 NOTE — Telephone Encounter (Signed)
 Prescription refill request for Eliquis received. Indication: PAF Last office visit: 05/18/23  Olivia Addison MD Scr: 0.71 on 04/13/22  Epic Age: 73 Weight: 82.6kg  Based on above findings Eliquis 5mg  twice daily is the appropriate dose.  Pt is past due for labs and appt with Dr Jens Som,  Message sent to scheduler to make appt.  Refill approved x 1 only.

## 2024-02-02 ENCOUNTER — Other Ambulatory Visit: Payer: Self-pay | Admitting: Internal Medicine

## 2024-02-02 DIAGNOSIS — I480 Paroxysmal atrial fibrillation: Secondary | ICD-10-CM

## 2024-02-26 ENCOUNTER — Other Ambulatory Visit: Payer: Self-pay | Admitting: Cardiology

## 2024-02-26 DIAGNOSIS — I480 Paroxysmal atrial fibrillation: Secondary | ICD-10-CM

## 2024-02-28 NOTE — Telephone Encounter (Signed)
 Prescription refill request for Eliquis  received. Indication:afib Last office visit:needs appt WUJ:[id number] labs Age: 73 Weight:82.6  kg  Prescription refilled

## 2024-03-13 ENCOUNTER — Emergency Department (HOSPITAL_BASED_OUTPATIENT_CLINIC_OR_DEPARTMENT_OTHER)

## 2024-03-13 ENCOUNTER — Encounter (HOSPITAL_BASED_OUTPATIENT_CLINIC_OR_DEPARTMENT_OTHER): Payer: Self-pay

## 2024-03-13 ENCOUNTER — Other Ambulatory Visit: Payer: Self-pay

## 2024-03-13 ENCOUNTER — Emergency Department (HOSPITAL_BASED_OUTPATIENT_CLINIC_OR_DEPARTMENT_OTHER)
Admission: EM | Admit: 2024-03-13 | Discharge: 2024-03-13 | Disposition: A | Discharge status: Home / Self Care | Attending: Emergency Medicine | Admitting: Emergency Medicine

## 2024-03-13 DIAGNOSIS — R1032 Left lower quadrant pain: Secondary | ICD-10-CM | POA: Diagnosis not present

## 2024-03-13 DIAGNOSIS — Z7901 Long term (current) use of anticoagulants: Secondary | ICD-10-CM | POA: Diagnosis not present

## 2024-03-13 DIAGNOSIS — N950 Postmenopausal bleeding: Secondary | ICD-10-CM | POA: Diagnosis not present

## 2024-03-13 DIAGNOSIS — N939 Abnormal uterine and vaginal bleeding, unspecified: Secondary | ICD-10-CM | POA: Diagnosis not present

## 2024-03-13 DIAGNOSIS — Z79899 Other long term (current) drug therapy: Secondary | ICD-10-CM | POA: Insufficient documentation

## 2024-03-13 DIAGNOSIS — I4891 Unspecified atrial fibrillation: Secondary | ICD-10-CM | POA: Insufficient documentation

## 2024-03-13 DIAGNOSIS — M545 Low back pain, unspecified: Secondary | ICD-10-CM

## 2024-03-13 DIAGNOSIS — R9389 Abnormal findings on diagnostic imaging of other specified body structures: Secondary | ICD-10-CM | POA: Diagnosis not present

## 2024-03-13 LAB — CBC WITH DIFFERENTIAL/PLATELET
Abs Immature Granulocytes: 0.02 10*3/uL (ref 0.00–0.07)
Basophils Absolute: 0.1 10*3/uL (ref 0.0–0.1)
Basophils Relative: 1 %
Eosinophils Absolute: 0.6 10*3/uL — ABNORMAL HIGH (ref 0.0–0.5)
Eosinophils Relative: 6 %
HCT: 38.9 % (ref 36.0–46.0)
Hemoglobin: 13.1 g/dL (ref 12.0–15.0)
Immature Granulocytes: 0 %
Lymphocytes Relative: 21 %
Lymphs Abs: 1.9 10*3/uL (ref 0.7–4.0)
MCH: 30.9 pg (ref 26.0–34.0)
MCHC: 33.7 g/dL (ref 30.0–36.0)
MCV: 91.7 fL (ref 80.0–100.0)
Monocytes Absolute: 0.6 10*3/uL (ref 0.1–1.0)
Monocytes Relative: 6 %
Neutro Abs: 6.1 10*3/uL (ref 1.7–7.7)
Neutrophils Relative %: 66 %
Platelets: 334 10*3/uL (ref 150–400)
RBC: 4.24 MIL/uL (ref 3.87–5.11)
RDW: 12.2 % (ref 11.5–15.5)
WBC: 9.3 10*3/uL (ref 4.0–10.5)
nRBC: 0 % (ref 0.0–0.2)

## 2024-03-13 LAB — COMPREHENSIVE METABOLIC PANEL WITH GFR
ALT: 13 U/L (ref 0–44)
AST: 18 U/L (ref 15–41)
Albumin: 4.3 g/dL (ref 3.5–5.0)
Alkaline Phosphatase: 51 U/L (ref 38–126)
Anion gap: 15 (ref 5–15)
BUN: 17 mg/dL (ref 8–23)
CO2: 22 mmol/L (ref 22–32)
Calcium: 10.2 mg/dL (ref 8.9–10.3)
Chloride: 102 mmol/L (ref 98–111)
Creatinine, Ser: 0.87 mg/dL (ref 0.44–1.00)
GFR, Estimated: >60 mL/min (ref >60)
Glucose, Bld: 106 mg/dL — ABNORMAL HIGH (ref 70–99)
Potassium: 4 mmol/L (ref 3.5–5.1)
Sodium: 140 mmol/L (ref 135–145)
Total Bilirubin: 0.6 mg/dL (ref 0.0–1.2)
Total Protein: 7.2 g/dL (ref 6.5–8.1)

## 2024-03-13 LAB — URINALYSIS, ROUTINE W REFLEX MICROSCOPIC
Bilirubin Urine: NEGATIVE
Glucose, UA: NEGATIVE mg/dL
Ketones, ur: NEGATIVE mg/dL
Nitrite: NEGATIVE
Protein, ur: 30 mg/dL — AB
Specific Gravity, Urine: 1.027 (ref 1.005–1.030)
pH: 5.5 (ref 5.0–8.0)

## 2024-03-13 NOTE — ED Notes (Signed)
 Reviewed AVS/discharge instruction with patient. Time allotted for and all questions answered. Patient is agreeable for d/c and escorted to ed exit by staff.

## 2024-03-13 NOTE — ED Notes (Signed)
 Patient states she is unable to provide Urine sample at this time

## 2024-03-13 NOTE — ED Triage Notes (Signed)
 Pt c/o vaginal bleeding onset last night while standing at the sink. Also c/o L sided back/ flank pain. Endorses dizziness, HA.

## 2024-03-13 NOTE — ED Provider Notes (Signed)
 [place] EMERGENCY DEPARTMENT AT [facility] Provider Note   CSN: [id number] Arrival date & time: 03/13/24  1520     Patient presents with: Vaginal Bleeding   Olivia Reynolds is a 73 y.o. female.    Patient with history of atrial fibrillation on anticoagulation and compliant, metoprolol  and diltiazem  -- presents the emergency department today for evaluation of new onset vaginal bleeding.  Symptoms started last evening.  She reports bleeding, that she is certain was coming from the vagina.  No hematuria noted.  This has slowed today.  Patient has had some left abdominal pain without stool changes, or vomiting.  She has a headache, different than her typical migraines, on the top of my head.  No thunderclap, confusion or vomiting.  Patient has not seen an OB/GYN in several years.  She denies other bleeding such as from the gums, blood in the stool.      Prior to Admission medications   Medication Sig Start Date End Date Taking? Authorizing Provider  acetaminophen  (TYLENOL ) 500 MG tablet Take 500 mg by mouth every 6 (six) hours as needed.    [provider]  apixaban  (ELIQUIS ) 5 MG TABS tablet Take 1 tablet (5 mg total) by mouth 2 (two) times daily. NEEDS CARDIOLOGY APPT FOR ELIQUIS  REFILLS, CALL OFFICE 02/28/24   Lenise Quince, MD  buPROPion  (WELLBUTRIN  XL) 300 MG 24 hr tablet Take 300 mg by mouth every morning. 09/29/21   [provider]  diltiazem  (CARDIZEM  CD) 240 MG 24 hr capsule TAKE 1 CAPSULE(240 MG) BY MOUTH DAILY 02/03/24   Sylvan Evener, MD  HYDROcodone  bit-homatropine (HYCODAN) 5-1.5 MG/5ML syrup Take 5 mLs by mouth every 8 (eight) hours as needed for cough. 11/23/23   Sylvan Evener, MD  metoprolol  succinate (TOPROL -XL) 25 MG 24 hr tablet Take 1.5 tablets (37.5 mg total) by mouth daily. 05/21/23   Lenise Quince, MD  predniSONE  (DELTASONE ) 10 MG tablet Take in tapering course as directed 6-5-4-3-2-1 11/23/23   Sylvan Evener, MD  rosuvastatin   (CRESTOR ) 20 MG tablet TAKE 1 TABLET BY MOUTH EVERY DAY 05/22/22   Lenise Quince, MD  sertraline  (ZOLOFT ) 50 MG tablet Take 1 tablet (50 mg total) by mouth daily. 03/18/23   Sylvan Evener, MD  temazepam (RESTORIL) 15 MG capsule Take 30 mg by mouth at bedtime as needed. 10/08/21   [provider]    Allergies: Erythromycin, Statins, Codeine, Hctz [hydrochlorothiazide], and Lipitor [atorvastatin]    Review of Systems  Updated Vital Signs BP 129/77 (BP Location: Right Arm)   Pulse 73   Temp 98.4 F (36.9 C)   Resp 16   SpO2 99%   Physical Exam Vitals and nursing note reviewed.  Constitutional:      General: She is not in acute distress.    Appearance: She is well-developed.  HENT:     Head: Normocephalic and atraumatic.     Right Ear: External ear normal.     Left Ear: External ear normal.     Nose: Nose normal.   Eyes:     Conjunctiva/sclera: Conjunctivae normal.    Cardiovascular:     Rate and Rhythm: Normal rate and regular rhythm.     Heart sounds: No murmur heard. Pulmonary:     Effort: No respiratory distress.     Breath sounds: No wheezing, rhonchi or rales.  Abdominal:     Palpations: Abdomen is soft.     Tenderness: There is abdominal tenderness. There is  no guarding or rebound.     Comments: Mild left lateral abdominal tenderness, no rebound or guarding   Musculoskeletal:     Cervical back: Normal range of motion and neck supple.     Right lower leg: No edema.     Left lower leg: No edema.   Skin:    General: Skin is warm and dry.     Findings: No rash.   Neurological:     General: No focal deficit present.     Mental Status: She is alert. Mental status is at baseline.     Motor: No weakness.   Psychiatric:        Mood and Affect: Mood normal.     (all labs ordered are listed, but only abnormal results are displayed) Labs Reviewed  COMPREHENSIVE METABOLIC PANEL WITH GFR - Abnormal; Notable for the following components:      Result  Value   Glucose, Bld 106 (*)    All other components within normal limits  CBC WITH DIFFERENTIAL/PLATELET - Abnormal; Notable for the following components:   Eosinophils Absolute 0.6 (*)    All other components within normal limits  URINALYSIS, ROUTINE W REFLEX MICROSCOPIC   Patient seen and examined. History obtained directly from patient. Work-up including labs, imaging, EKG ordered in triage, if performed, were reviewed.    Labs/EKG: Independently reviewed and interpreted.  This included: CBC with normal white blood cells, hemoglobin, platelets; CMP glucose 106 otherwise unremarkable.  Imaging: Transvaginal ultrasound ordered.  Medications/Fluids: None ordered  Most recent vital signs reviewed and are as follows: BP 129/77 (BP Location: Right Arm)   Pulse 73   Temp 98.4 F (36.9 C)   Resp 16   SpO2 99%   Initial impression: Vaginal bleeding in patient, postmenopausal, anticoagulation status.  6:59 PM Reassessment performed. Patient appears stable and comfortable.  She confirms that her bleeding has slowed.  She is not in any discomfort currently.  Imaging personally visualized and interpreted including: Transvaginal ultrasound does not show significant thickening of the endometrial lining.  No fibroids or other structural abnormalities.  Ovaries not visualized.  Reviewed pertinent lab work and imaging with patient and daughter at bedside. Questions answered.  I did offer to perform pelvic exam.  We discussed that another possible cause of bleeding could be a break in the uterine lining from something such as atrophic vaginitis.  Discussed that a pelvic exam would allow us  to potentially identify a source of bleeding.  After discussion with patient, she states that she would prefer to follow-up with her OB/GYN.  We discussed that it is very important for her to follow-up with OB/GYN regardless of whether her symptoms resolve or persist.  She verbalizes understanding.  We did discuss  signs and symptoms which should cause return including worsening abdominal or pelvic pain, worsening or heavier bleeding.  She verbalizes understanding.  Currently awaiting UA and plan for discharge.  Most current vital signs reviewed and are as follows: BP 115/71   Pulse 69   Temp 98.4 F (36.9 C)   Resp 16   SpO2 97%   Plan: Discharge to home.   Prescriptions written for: None  Other home care instructions discussed: Very close monitoring of symptoms  ED return instructions discussed: Return with worsening pain, heavier bleeding, new or worsening symptoms.  Follow-up instructions discussed: Patient encouraged to follow-up with their OB/GYN, ideally in the next 1 week for reevaluation.  EKG: None  Radiology: No results found.   Procedures  Medications Ordered in the ED - No data to display                                  Medical Decision Making Amount and/or Complexity of Data Reviewed Labs: ordered. Radiology: ordered.   Patient with postmenopausal vaginal bleeding.  Ultrasound reassuring.  Patient is on anticoagulation and her hemoglobin is stable.  Vital signs are normal.  Offered pelvic, patient declines.  She seems willing to follow-up with OB/GYN.  No focal abdominal tenderness on exam.  No rebound or guarding.  Do not feel that CT imaging would likely be very helpful at this point.  She seems reliable to return if worsening.     Final diagnoses:  Postmenopausal bleeding    ED Discharge Orders     None          Lyna Sandhoff, PA-C 03/13/24 1904    Sallyanne Creamer, DO 03/14/24 0032

## 2024-03-13 NOTE — Discharge Instructions (Signed)
 Your blood cell counts tonight look normal including your hemoglobin.  The ultrasound shows a normal-appearing uterus with a small amount of fluid in the endometrial canal.  Your ovaries were not visible.  As we discussed, you should follow-up with OB/GYN regardless of whether your symptoms improve or not.  You need to return to the emergency department with worsening pain, or if the bleeding becomes heavier again.   Please follow-up with the OB/GYN of your choice.  I have provided the information for the [facility] clinic if needed.

## 2024-03-13 NOTE — ED Provider Notes (Signed)
  Physical Exam  BP 115/71   Pulse 69   Temp 98.4 F (36.9 C)   Resp 16   SpO2 97%   Physical Exam Constitutional:      Appearance: Normal appearance.   Cardiovascular:     Rate and Rhythm: Normal rate and regular rhythm.  Pulmonary:     Effort: Pulmonary effort is normal.     Breath sounds: Normal breath sounds.  Abdominal:     General: Abdomen is flat.     Palpations: Abdomen is soft.   Neurological:     Mental Status: She is alert.     Procedures  Procedures  ED Course / MDM    Medical Decision Making Amount and/or Complexity of Data Reviewed Labs: ordered. Radiology: ordered.   Patient was received in signout from prior ED provider Josh PA-C.  She is hemodynamically stable and well-appearing.  Dispo is pending UA.  In short she came in for vaginal bleeding.  Prior to me seeing patient family member and patient requesting that they be discharged.  Did they do not want to wait for UA to come back.  I discussed with them that this could change treatment plan however they still would like to be discharged.  Overall reassuring labs vitals and imaging.  I discussed the need to follow-up with OB/GYN for endometrial biopsy.  Stable and well-appearing.  Discharged in good condition.         Eudora Heron, PA-C 03/13/24 2047    Sallyanne Creamer, DO 03/14/24 [phone]

## 2024-03-15 ENCOUNTER — Ambulatory Visit: Payer: Self-pay

## 2024-03-15 ENCOUNTER — Telehealth: Admitting: Internal Medicine

## 2024-03-15 ENCOUNTER — Encounter: Payer: Self-pay | Admitting: Internal Medicine

## 2024-03-15 VITALS — Ht 65.0 in | Wt 182.0 lb

## 2024-03-15 DIAGNOSIS — Z7901 Long term (current) use of anticoagulants: Secondary | ICD-10-CM | POA: Diagnosis not present

## 2024-03-15 DIAGNOSIS — M549 Dorsalgia, unspecified: Secondary | ICD-10-CM | POA: Diagnosis not present

## 2024-03-15 DIAGNOSIS — F419 Anxiety disorder, unspecified: Secondary | ICD-10-CM

## 2024-03-15 DIAGNOSIS — N950 Postmenopausal bleeding: Secondary | ICD-10-CM | POA: Diagnosis not present

## 2024-03-15 DIAGNOSIS — M545 Low back pain, unspecified: Secondary | ICD-10-CM

## 2024-03-15 DIAGNOSIS — I480 Paroxysmal atrial fibrillation: Secondary | ICD-10-CM

## 2024-03-15 MED ORDER — CIPROFLOXACIN HCL 250 MG PO TABS: 250.0000 mg | ORAL_TABLET | Freq: Two times a day (BID) | ORAL | 0 refills | Status: AC

## 2024-03-15 NOTE — Progress Notes (Addendum)
 Subjective:    Patient ID: Olivia Reynolds, female    DOB: Feb 16, 1951, 73 y.o.   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  She presented to Emergency department at [facility] on June 16 with vaginal bleeding that was heavy.  Report indicates her abdominal exam was normal.  She has a history of paroxysmal atrial fibrillation and is on chronic anticoagulation.  Onset of vaginal bleeding was June 15.  Denied hematuria.  Complained of some back pain and left abdominal pain.  Was found to have some mild left lateral abdominal tenderness without rebound or guarding on exam and emergency department.  No vaginal exam was performed.   She is at her home and I am at my office.  She is agreeable to visit in this format today.  She is identified using 2 identifiers as Payson C. Perrell, a patient in this practice.  Patient says she is continuing to have some right-sided back pain.  She is concerned she might have a urinary tract infection but has no fever, chills, nausea, vomiting, or significant dysuria.  Says vaginal bleeding has decreased.  She request referral to Dr. Ronal Pinal for GYN care.  Has not seen a GYN physician in a number of years.  She was formerly was affiliated with Dr. Genetta practice and many of those providers that she knew have retired.  A urine specimen was obtained in the Emergency Department at [facility].  She had large hemoglobin and large leukocytes on urine specimen.   No microscopic evaluation was done  and no urinary culture was obtained.  She did not receive treatment for urinary tract infection.  BUN and creatinine were normal.  Electrolytes were normal.  White blood cell count was normal and hemoglobin was 13.1 g on June 16 when she was seen.  Past medical history: Has longstanding history of GE reflux  and underwent fundoplication in the remote past.  History of hyperplastic colon polyps.  Colonoscopy was done in 2013 with repeat study recommended in 5 to 10 years.  Last upper endoscopy was 2018.  Is followed by cardiologist for paroxysmal atrial fibrillation and is on chronic anticoagulation.  History of obesity and hyperlipidemia.  History of knee surgery in 2004.  Surgery for epicondylitis of elbow in 2002.  Bilateral tubal ligation 1986.  3 pregnancies and no miscarriages.  History of severe back pain in the spring 2021 and saw Dr. Malcolm.  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  She had epidural steroid injections after trial of oral steroids.  She had prolonged recovery but finally improved.  Social history: Does not smoke.  Social alcohol consumption consisting of wine but not very often.  3 adult children, 2 sons and a daughter.  She is divorced and resides alone.  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  Family history: Daughter with history of Crohn's disease.  Mother died with complications of COPD.  Father deceased with history of dementia, hypertension and stroke.  Sister with history of diabetes hypertension and had bariatric surgery.  Sister with history of uterine cancer.  1 brother with history of kidney stones, hypertension, gout and allergies.  Had bone density study in 2018 with T-score of -2.0.  History of anxiety and depression related to divorce and situational stress and has  been seen at [facility] by Olam Blackwater.  Review of Systems no fever, chills, nausea, vomiting, diarrhea     Objective:   Physical Exam She is seen virtually via video and is able to give a clear concise history of right sided back pain.  No frank dysuria but she is concerned about possible urinary tract infection.       Assessment & Plan:   Sudden onset of vaginal bleeding requiring Emergency department visit.  Has not seen GYN in a  number of years and will make referral to Dr. Cleotilde at patient request.  Is on chronic anticoagulation consisting of Eliquis  per Dr. Pietro, cardiologist.  History of paroxysmal atrial fibrillation.  Right sided back pain-etiology unclear if this is musculoskeletal or other etiology.  Continuing to have vaginal bleeding.  Denies dysuria, fever, chills, nausea or vomiting.  Patient reports she would like to be covered for possible UTI and will send in 5 days of Cipro  250 mg twice daily.  Says she cannot come in and give specimen today and tells me it took 3 hours to get specimen in the emergency department.  Is on chronic anticoagulation for history of paroxysmal atrial fibrillation which could be exacerbating the vaginal bleeding.  Her hemoglobin was stable in the emergency department on June 16 at 13.1 g.  Her white blood cell count was normal as well as her platelet count.  Her CMET was essentially normal with the exception of a glucose of 106 nonfasting.  Liver functions were normal.  BUN and creatinine were normal.  Coags were not done.  She is on Eliquis  per Dr. Pietro, her cardiologist.  She is also on diltiazem  ,metoprolol , rosuvastatin , Zoloft , and Wellbutrin .  I, Ronal JINNY Hailstone, MD, have reviewed all documentation for this visit. The documentation on 03/15/24 for the exam, diagnosis, procedures, and orders are all accurate and complete.

## 2024-03-15 NOTE — Telephone Encounter (Signed)
 FYI Only or Action Required?: Action required by provider  Patient was last seen in primary care on 11/23/2023 by Sylvan Evener, MD. Called Nurse Triage reporting Back Pain. Symptoms began several days ago. Interventions attempted: OTC medications: tylenol . Symptoms are: unchanged.  Triage Disposition: See PCP When Office is Open (Within 3 Days)  Patient/caregiver understands and will follow disposition?: No  Copied from CRM [id number]. Topic: Clinical - Red Word Triage >> Mar 15, 2024 10:29 AM Emylou G wrote: [facility] that prompted transfer to Nurse Triage: Was at the [facility] ER Monday..  Severe back pain - left flank.. Possible UTI? Reason for Disposition  [1] MODERATE back pain (e.g., interferes with normal activities) AND [2] present > 3 days  Answer Assessment - Initial Assessment Questions 1. ONSET: When did the pain begin?      Started about 4 days agot 2. LOCATION: Where does it hurt? (upper, mid or lower back)     L lower back 3. SEVERITY: How bad is the pain?  (e.g., Scale 1-10; mild, moderate, or severe)   - MILD (1-3): Doesn't interfere with normal activities.    - MODERATE (4-7): Interferes with normal activities or awakens from sleep.    - SEVERE (8-10): Excruciating pain, unable to do any normal activities.      7, hurts to roll over 4. PATTERN: Is the pain constant? (e.g., yes, no; constant, intermittent)      constant 5. RADIATION: Does the pain shoot into your legs or somewhere else?     To pelvis 6. CAUSE:  What do you think is causing the back pain?      Unsure, this back pain with UTIs in past 7. BACK OVERUSE:  Any recent lifting of heavy objects, strenuous work or exercise?     denies 8. MEDICINES: What have you taken so far for the pain? (e.g., nothing, acetaminophen , NSAIDS)     Tylenol  does help 9. NEUROLOGIC SYMPTOMS: Do you have any weakness, numbness, or problems with bowel/bladder control?     denies 10. OTHER SYMPTOMS: Do you have  any other symptoms? (e.g., fever, abdomen pain, burning with urination, blood in urine)       Vaginal bleed since beginning, has now resolved  Refusing appt at this time, states she would just like a provider to review the UA from the most recent ED visit.  Protocols used: Back Pain-A-AH

## 2024-03-15 NOTE — Patient Instructions (Signed)
 Patient is concerned she may have a urinary infection due to persistent back pain. Says it took 3 hours for her to give urine specimen at [facility]. Still having issues with postmenopausal bleeding and has not seen GYN in a number of years. Used to go to Dr. Jinger Mount. Says she cannot come in and give urine specimen today. I am sending in Cipro  250 mg twice daily for 5 days. Making referral to Dr. Lillian Rein whom patient has asked to see for GYN care.

## 2024-03-16 ENCOUNTER — Other Ambulatory Visit (HOSPITAL_COMMUNITY)
Admission: RE | Admit: 2024-03-16 | Discharge: 2024-03-16 | Disposition: A | Discharge status: Home / Self Care | Source: Ambulatory Visit | Attending: Obstetrics & Gynecology | Admitting: Obstetrics & Gynecology

## 2024-03-16 ENCOUNTER — Other Ambulatory Visit: Payer: Self-pay | Admitting: Cardiology

## 2024-03-16 ENCOUNTER — Encounter (HOSPITAL_BASED_OUTPATIENT_CLINIC_OR_DEPARTMENT_OTHER): Payer: Self-pay | Admitting: Obstetrics & Gynecology

## 2024-03-16 ENCOUNTER — Ambulatory Visit (HOSPITAL_BASED_OUTPATIENT_CLINIC_OR_DEPARTMENT_OTHER): Admitting: Obstetrics & Gynecology

## 2024-03-16 VITALS — BP 127/66 | HR 70 | Ht 63.5 in | Wt 174.2 lb

## 2024-03-16 DIAGNOSIS — I480 Paroxysmal atrial fibrillation: Secondary | ICD-10-CM | POA: Diagnosis not present

## 2024-03-16 DIAGNOSIS — N858 Other specified noninflammatory disorders of uterus: Secondary | ICD-10-CM | POA: Diagnosis not present

## 2024-03-16 DIAGNOSIS — Z124 Encounter for screening for malignant neoplasm of cervix: Secondary | ICD-10-CM | POA: Insufficient documentation

## 2024-03-16 DIAGNOSIS — N950 Postmenopausal bleeding: Secondary | ICD-10-CM

## 2024-03-16 NOTE — Progress Notes (Signed)
 New patient Problem Appt Patient name: Olivia Reynolds MRN [id number]  Date of birth: April 26, 1951 Chief Complaint:   New Patient (Initial Visit) (Establish Care)  History of Present Illness:   Olivia Reynolds is a 73 y.o. G3P3 Caucasian female being seen today for new patient appointment due to episode of PMP bleeding that occurred on 03/13/2024.  It was bright red and enough bleeding that she went to the emergency room.  In the ER, urinalysis was obtained.  There was blood in the urine.  She is on antibiotics.  WBC ct normal.  Hb 13.1. CMP nl except glucose 106.  Ultrasound was performed.  Have reviewed images personally.  Fluid wtihin endometrial cavity noted but this makes endometrium very easily visualized and measured 5.70mm.  Endometrium appears symmetric.  Ovaries not well visualized.  Uterus ~6 x 3 x4cm.    Discussed findings with pt.  Recommend updating pap smear and doing endometrial biopsy today.  She does have family hx of uterine cancer in a sister who was diagnosed in her .ater 30's or early 50's.  She is still alive.  Pt reports having some genetic testing (not in EPIC).  She will get this to me.    She is on eliquis  which she takes for afib.  Colonoscopy:  04/2021 BMD:  04/2022 MMG:  06/2023     03/16/2024   11:09 AM 11/23/2023    2:09 PM 09/09/2022   12:58 PM 09/07/2022    4:02 PM 07/28/2022   11:54 AM  Depression screen PHQ 2/9  Decreased Interest 0 0 0 0 0  Down, Depressed, Hopeless 0 0 0 0 0  PHQ - 2 Score 0 0 0 0 0     Review of Systems:   Pertinent items are noted in HPI Denies any pelvic pain Pertinent History Reviewed:  Reviewed past medical,surgical, social and family history.  Reviewed problem list, medications and allergies. Physical Assessment:   Vitals:   03/16/24 1102  BP: 127/66  Pulse: 70  Weight: 174 lb 3.2 oz (79 kg)  Height: 5' 3.5 (1.613 m)  Body mass index is 30.37 kg/m.        Physical Examination:   General appearance - well  appearing, and in no distress  Mental status - alert, oriented to person, place, and time  Psych:  She has a normal mood and affect  Skin - warm and dry, normal color, no suspicious lesions noted  Breasts - breasts appear normal, no suspicious masses, no skin or nipple changes or  axillary nodes  Abdomen - soft, nontender, nondistended, no masses or organomegaly  Pelvic - VULVA: normal appearing vulva with no masses, tenderness or lesions   VAGINA: vaginal atrophy noted CERVIX: normal appearing cervix without discharge or lesions, no CMT  Thin prep pap is done without HR HPV cotesting  UTERUS: uterus is felt to be normal size, shape, consistency and nontender   ADNEXA: No adnexal masses or tenderness noted.  Rectal - normal rectal, good sphincter tone, no masses felt, small hemorrhoid noted  Extremities:  No swelling or varicosities noted  Endometrial biopsy recommended.  Discussed with patient.  Verbal and written consent obtained.   Procedure:  Speculum placed.  Cervix visualized and cleansed with betadine prep.  A single toothed tenaculum was applied to the anterior lip of the cervix.  Endometrial pipelle was advanced through the cervix into the endometrial cavity without difficulty.  Pipelle passed to 6cm.  Suction applied and pipelle removed with only  clear fluid noted to start.  Two more passes performed with scan tissue noted.  Tenculum removed.  Speculum removed.  Patient tolerated procedure well.   Chaperone present for exam  Assessment & Plan:  1. Postmenopausal bleeding (Primary) - pap smear and endometrial biopsy pending.  Findings reassuring today.  If negative (as bleeding has stopped), will just monitor conservatively. - Cytology - PAP( [place]) - Surgical pathology( [facility]/ [facility])  2. Paroxysmal atrial fibrillation (HCC) - on eliquis     Lillian Rein, MD 03/16/2024 12:20 PM

## 2024-03-17 ENCOUNTER — Ambulatory Visit (HOSPITAL_BASED_OUTPATIENT_CLINIC_OR_DEPARTMENT_OTHER): Payer: Self-pay | Admitting: Obstetrics & Gynecology

## 2024-03-17 LAB — SURGICAL PATHOLOGY

## 2024-03-20 LAB — CYTOLOGY - PAP: Diagnosis: NEGATIVE

## 2024-03-20 NOTE — Telephone Encounter (Signed)
 LMOM for patient to call office to receive lab results and recommendations. tbw

## 2024-03-20 NOTE — Telephone Encounter (Signed)
-----   Message from Ronal GORMAN Pinal sent at 03/17/2024  2:01 PM EDT ----- Regarding: pathology I sent pt her results in my chart but got a message back that her email didn't go through so she might not get a notification to look at the results.  Please let her know her endometrial biopsy was negative.  She needs to call if has any new bleeding.  Thank you.  MSM

## 2024-04-02 ENCOUNTER — Other Ambulatory Visit: Payer: Self-pay | Admitting: Internal Medicine

## 2024-04-16 ENCOUNTER — Other Ambulatory Visit: Payer: Self-pay | Admitting: Cardiology

## 2024-04-16 DIAGNOSIS — I480 Paroxysmal atrial fibrillation: Secondary | ICD-10-CM

## 2024-04-17 NOTE — Telephone Encounter (Signed)
 Prescription refill request for Eliquis  received. Indication: PAF Last office visit: 05/18/23  KATHEE Shallow MD Scr: 0.87 on 03/13/24  Epic Age: 73 Weight: 82.6kg  Based on above findings Eliquis  5mg  twice daily is the appropriate dose.  Refill approved.

## 2024-05-10 ENCOUNTER — Other Ambulatory Visit: Payer: Self-pay | Admitting: Internal Medicine

## 2024-05-10 DIAGNOSIS — I480 Paroxysmal atrial fibrillation: Secondary | ICD-10-CM

## 2024-06-05 ENCOUNTER — Telehealth: Payer: Self-pay | Admitting: Internal Medicine

## 2024-06-06 ENCOUNTER — Other Ambulatory Visit: Payer: Self-pay | Admitting: Cardiology

## 2024-06-06 DIAGNOSIS — H5010 Unspecified exotropia: Secondary | ICD-10-CM | POA: Diagnosis not present

## 2024-06-06 DIAGNOSIS — H52203 Unspecified astigmatism, bilateral: Secondary | ICD-10-CM | POA: Diagnosis not present

## 2024-06-06 DIAGNOSIS — H02403 Unspecified ptosis of bilateral eyelids: Secondary | ICD-10-CM | POA: Diagnosis not present

## 2024-06-06 DIAGNOSIS — H43813 Vitreous degeneration, bilateral: Secondary | ICD-10-CM | POA: Diagnosis not present

## 2024-06-06 DIAGNOSIS — H04123 Dry eye syndrome of bilateral lacrimal glands: Secondary | ICD-10-CM | POA: Diagnosis not present

## 2024-06-06 DIAGNOSIS — H524 Presbyopia: Secondary | ICD-10-CM | POA: Diagnosis not present

## 2024-06-07 ENCOUNTER — Telehealth: Payer: Self-pay | Admitting: Internal Medicine

## 2024-06-08 NOTE — Telephone Encounter (Signed)
 Copied from CRM [id number]. Topic: General - Other >> Jun 07, 2024  1:17 PM Olivia Reynolds wrote: Reason for CRM: Pt was told to come in tomorrow for labs for her 9/12 appt. She said she is not available tomorrow but she was also told that 9/12 was a video appt. It is an AWV appt and her actual physical is 10/27. Does pt need to come in tomorrow for labs and should 9/12 be a telephone appt? Tried contacting [facility] but office is at lunch.

## 2024-06-09 ENCOUNTER — Ambulatory Visit: Admitting: Internal Medicine

## 2024-07-05 ENCOUNTER — Ambulatory Visit: Admitting: Internal Medicine

## 2024-07-05 ENCOUNTER — Telehealth: Payer: Self-pay

## 2024-07-05 ENCOUNTER — Encounter: Payer: Self-pay | Admitting: Internal Medicine

## 2024-07-05 VITALS — BP 122/70 | HR 65 | Ht 64.0 in | Wt 171.2 lb

## 2024-07-05 DIAGNOSIS — I480 Paroxysmal atrial fibrillation: Secondary | ICD-10-CM | POA: Diagnosis not present

## 2024-07-05 DIAGNOSIS — M48061 Spinal stenosis, lumbar region without neurogenic claudication: Secondary | ICD-10-CM | POA: Insufficient documentation

## 2024-07-05 DIAGNOSIS — K582 Mixed irritable bowel syndrome: Secondary | ICD-10-CM | POA: Diagnosis not present

## 2024-07-05 DIAGNOSIS — E739 Lactose intolerance, unspecified: Secondary | ICD-10-CM

## 2024-07-05 DIAGNOSIS — Z860100 Personal history of colon polyps, unspecified: Secondary | ICD-10-CM | POA: Diagnosis not present

## 2024-07-05 DIAGNOSIS — Z7901 Long term (current) use of anticoagulants: Secondary | ICD-10-CM | POA: Diagnosis not present

## 2024-07-05 NOTE — Telephone Encounter (Signed)
 Patient with diagnosis of PAF on Eliquis  for anticoagulation.    Procedure: colonoscopy Date of procedure: 07/26/24   CHA2DS2-VASc Score = 4  This indicates a 4.8% annual risk of stroke. The patient's score is based upon: CHF History: 0 HTN History: 1 Diabetes History: 0 Stroke History: 0 Vascular Disease History: 1 Age Score: 1 Gender Score: 1     CrCl 71 ml/min Platelet count 334K  Patient has not had an Afib/aflutter ablation in the last 3 months, DCCV within the last 4 weeks or a watchman implanted in the last 45 days    Per office protocol, patient can hold Eliquis  for 2 days prior to procedure.    **This guidance is not considered finalized until pre-operative APP has relayed final recommendations.**

## 2024-07-05 NOTE — Telephone Encounter (Signed)
 [facility] Pre-operative Risk Assessment     Request for surgical clearance:     Endoscopy Procedure  What type of surgery is being performed?     colonoscopy  When is this surgery scheduled?     07/26/2024  What type of clearance is required ?   Pharmacy  Are there any medications that need to be held prior to surgery and how long? Eliquis , 2 days  Practice name and name of physician performing surgery?       Gastroenterology  What is your office phone and fax number?      Phone- [phone]  Fax- [phone]  Anesthesia type (None, local, MAC, general) ?       MAC   Please route your response to Amylia Collazos [place], CMA

## 2024-07-05 NOTE — Patient Instructions (Signed)
 You have been scheduled for a colonoscopy. Please follow written instructions given to you at your visit today.   If you use inhalers (even only as needed), please bring them with you on the day of your procedure.  DO NOT TAKE 7 DAYS PRIOR TO TEST- Trulicity (dulaglutide) Ozempic, Wegovy (semaglutide) Mounjaro (tirzepatide) Bydureon Bcise (exanatide extended release)  DO NOT TAKE 1 DAY PRIOR TO YOUR TEST Rybelsus (semaglutide) Adlyxin (lixisenatide) Victoza (liraglutide) Byetta (exanatide) ___________________________________________________________________________  Rosine will be contaced by our office prior to your procedure for directions on holding your Eliquis .  If you do not hear from our office 1 week prior to your scheduled procedure, please call [phone] to discuss.  Ask for PJ, CMA  I appreciate the opportunity to care for you. Lupita Commander, MD, [facility]

## 2024-07-05 NOTE — Progress Notes (Signed)
 Olivia Reynolds 73 y.o. 12/11/1950 [id number]  Assessment & Plan:   Encounter Diagnoses  Name Primary?   History of colonic polyps Yes   Irritable bowel syndrome with both constipation and diarrhea    Chronic anticoagulation    PAF (paroxysmal atrial fibrillation) (HCC)    Lactose intolerance     Schedule surveillance colonoscopy due to history of polyps  The risks and benefits as well as alternatives of endoscopic procedure(s) have been discussed and reviewed. All questions answered. The patient agrees to proceed.  Will hold Eliquis  2 days prior,  check with cardiology to ensure that is acceptable that is acceptable.  Have explained rare but real increased stroke risk of procedure with holding Eliquis    Subjective:   Chief Complaint: History of colon polyps, time for colonoscopy  HPI 73 year old woman with a history of dysphagia after Nissen fundoplication status post EGD with esophageal dilation (last March 2023), bloating gas problems better with dairy elimination, here to discuss repeat colonoscopy due to personal history of colon polyps.  She also reports recent diarrhea and abdominal pain.  Overall she has done well avoiding dairy really helps with her symptoms as above.  Last week she had multiple episodes of diarrhea and cramps and this happens every several months or so.  She is not having significant dysphagia.  Does not think she needs esophageal dilation.  She reports that her sons have problems with genetic abnormalities and associated diseases and 1 son has collagenous gastritis.  She has been busy helping caring for her grandchildren and her retirement.  She misses work.  EGD 12/15/2021 - Tortuous esophagus. - Benign-appearing esophageal stenosis. Dilated. - 5 cm hiatal hernia, containing two tablets - A Nissen fundoplication was found, characterized by moderate stenosis. - Gastroesophageal flap valve classified as Hill Grade I (prominent fold, tight to  endoscope). - The examination was otherwise normal. - No specimens collected. Colonoscopy 05/17/2019 - Three diminutive polyps in the proximal transverse colon and in the cecum, removed with a cold snare. Resected and retrieved. - Diverticulosis in the sigmoid colon. - The examination was otherwise normal on direct and retroflexion views. - Personal history of colonic polyps.   Pathology 3 diminutive adenomas   Overall polyp history: 2003 - polyp no pathology specimen 2008 - 5 mm rightt hyperplastic polyp 08/2012 - diminutive hyperplastic polyps removed ascending and sigmoid  05/17/2019 3 diminutive right colon polyps - adenomas recall 2025  Allergies  Allergen Reactions   Erythromycin Hives   Statins     Lipitor   Codeine Hives   Hctz [Hydrochlorothiazide] Rash   Lipitor [Atorvastatin] Other (See Comments)    Joint pain   Current Meds  Medication Sig   acetaminophen  (TYLENOL ) 500 MG tablet Take 500 mg by mouth every 6 (six) hours as needed.   apixaban  (ELIQUIS ) 5 MG TABS tablet TAKE 1 TABLET BY MOUTH TWICE DAILY   diltiazem  (CARDIZEM  CD) 240 MG 24 hr capsule TAKE 1 CAPSULE(240 MG) BY MOUTH DAILY   metoprolol  succinate (TOPROL -XL) 25 MG 24 hr tablet Take 1 tablet (25 mg total) by mouth daily. PT. MUST MAKE AN APPOINTMENT IN ORDER TO RECEIVE ADDITIONAL REFILLS, FIRST ATTEMPT.   sertraline  (ZOLOFT ) 50 MG tablet TAKE 1 TABLET(50 MG) BY MOUTH DAILY   Past Medical History:  Diagnosis Date   Chronic headaches    Depression    Diastolic dysfunction    a. 12/2013 Echo: EF 60-65%, Gr1 DD, Ao sclerosis w/o stenosis, triv MR, mod dil LA, mild TR.  Diverticulosis    GERD (gastroesophageal reflux disease)    History of hiatal hernia    with repair   History of stress test    a. 10/2008 MV: Ef 75%, nl perfusion.   HTN (hypertension)    Hyperlipidemia    Migraine    Obesity    Osteopenia    PAF (paroxysmal atrial fibrillation) (HCC)    a. Anticoagulated w/ Eliquis  (CHA2DS2VASc = 3).    Past Surgical History:  Procedure Laterality Date   CATARACT EXTRACTION, BILATERAL     COLONOSCOPY     ELBOW SURGERY  left   Tendon Surgery   ESOPHAGEAL MANOMETRY N/A 02/23/2018   Procedure: ESOPHAGEAL MANOMETRY (EM);  Surgeon: Shila Gustav GAILS, MD;  Location: [facility];  Service: Endoscopy;  Laterality: N/A;   EYE SURGERY Left    L-yey myopia   HERNIA REPAIR  2008   nissan fundiplication   KNEE ARTHROSCOPY  2005   R-knee   LAPAROSCOPIC NISSEN FUNDOPLICATION     ROTATOR CUFF REPAIR Right    TONSILLECTOMY     TUBAL LIGATION     UPPER GASTROINTESTINAL ENDOSCOPY     Social History   Social History Narrative   Divorced, 3 children lives alone   She is a retired Librarian, 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[employer]   Former smoker no alcohol no drug use   family history includes Arthritis in her father; Asthma in her mother; COPD in her mother; Cancer in her father, sister, and son; Colon cancer in her cousin; Crohn's disease in her daughter; Diabetes in her sister; Hypertension in her brother, father, and sister; Pancreatic cancer in an other family member; Prostate cancer in her father; Stroke in her father.   Review of Systems As per HPI  Objective:   Physical Exam @BP  122/70   Pulse 65   Ht 5' 4 (1.626 m)   Wt 171 lb 4 oz (77.7 kg)   BMI 29.39 kg/m @  General:  NAD Eyes:   anicteric Lungs:  clear Heart::  S1S2 no rubs, murmurs or gallops Abdomen:  soft and nontender, BS+     Data Reviewed:  See HPI

## 2024-07-05 NOTE — Telephone Encounter (Signed)
 Pharmacy please advise on holding Eliquis  for scheduled for 07/26/2024. Last labs 03/13/2024. Thank you.

## 2024-07-05 NOTE — Telephone Encounter (Signed)
   Patient Name: Olivia Reynolds  DOB: April 26, 1951 MRN: [id number]  Primary Cardiologist: Redell Shallow, MD  Clinical pharmacists have reviewed the patient's past medical history, labs, and current medications as part of preoperative protocol coverage. The following recommendations have been made:  Patient with diagnosis of PAF on Eliquis  for anticoagulation.     Procedure: colonoscopy Date of procedure: 07/26/24     CHA2DS2-VASc Score = 4  This indicates a 4.8% annual risk of stroke. The patient's score is based upon: CHF History: 0 HTN History: 1 Diabetes History: 0 Stroke History: 0 Vascular Disease History: 1 Age Score: 1 Gender Score: 1   CrCl 71 ml/min Platelet count 334K   Patient has not had an Afib/aflutter ablation in the last 3 months, DCCV within the last 4 weeks or a watchman implanted in the last 45 days      Per office protocol, patient can hold Eliquis  for 2 days prior to procedure.    I will route this recommendation to the requesting party via Epic fax function and remove from pre-op pool.  Please call with questions.  Lamarr Satterfield, NP 07/05/2024, 4:45 PM

## 2024-07-06 NOTE — Telephone Encounter (Signed)
 I have left Olivia Reynolds a detailed message to hold the Olivia Reynolds  2 days prior to her procedure. I told her to please call me back to let me know she got my message.

## 2024-07-11 NOTE — Telephone Encounter (Signed)
 Olivia Reynolds called back and said she got my message and she has wrote it on her calendar.

## 2024-07-11 NOTE — Telephone Encounter (Signed)
 I have left her another detailed message to make sure she got my message about holding the Eliquis .

## 2024-07-19 ENCOUNTER — Encounter: Payer: Self-pay | Admitting: Internal Medicine

## 2024-07-20 ENCOUNTER — Other Ambulatory Visit

## 2024-07-20 DIAGNOSIS — I10 Essential (primary) hypertension: Secondary | ICD-10-CM

## 2024-07-20 DIAGNOSIS — Z1329 Encounter for screening for other suspected endocrine disorder: Secondary | ICD-10-CM

## 2024-07-20 DIAGNOSIS — R7302 Impaired glucose tolerance (oral): Secondary | ICD-10-CM

## 2024-07-20 DIAGNOSIS — F419 Anxiety disorder, unspecified: Secondary | ICD-10-CM | POA: Diagnosis not present

## 2024-07-20 DIAGNOSIS — Z7901 Long term (current) use of anticoagulants: Secondary | ICD-10-CM

## 2024-07-20 DIAGNOSIS — Z0000 Encounter for general adult medical examination without abnormal findings: Secondary | ICD-10-CM

## 2024-07-20 NOTE — Progress Notes (Signed)
 Annual Wellness Visit   Patient Care Team: Rebbie Lauricella, Ronal PARAS, MD as PCP - General (Internal Medicine) Pietro Redell RAMAN, MD as PCP - Cardiology (Cardiology) Pietro Redell RAMAN, MD as Consulting Physician (Cardiology)  Visit Date: 07/24/24   Chief Complaint  Patient presents with   Annual Exam   Subjective:  Patient: Olivia Reynolds, Female DOB: 1951-06-07, 73 y.o. MRN: [id number] Vitals:   07/24/24 1455  BP: 136/82   Olivia Reynolds is a 73 y.o. Female who presents today for her Annual Wellness Visit. 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  For the past two weeks her back has been hurting. She has been having stomach issues and she hasn't been sleeping well and she has been very fatigued.   This year, she was taking her daughter's dog out and the dog took off running and pulled her with it and she hit her head in the drive way.     History of Hyperlipidemia treated with rosuvastatin  20 mg daily. 07/20/2024 Lipid Panel CHOL 328, HDL 46, Triglycerides 240, LDL 238. She Was walking up the stairs and her leg gave out which happened when she was on a previously on a different statin so she stopped taking it. She said that she has changed her diet and is trying to lose weight.   History of Hypertension treated with Metoprolol  Succinate 25 mg daily.  Blood pressure today is normal at 136/82.   History of Anxiety and depression treated with Zoloft  50 mg daily.  She is experiencing some  situational stress due to her children's health issues and taking care of her grandchildren.   History of Paroxysmal Atrial Fibrillation treated with Cardizem  240 mg daily and Eliquis  5 mg daily.   History of severe back pain in the Spring 2021. She saw Dr. Malcolm. An MRI of the LS spine showed right L4-L5 lateral recess stenosis. She had epidural steroid injections after a trial of oral steroids. Had prolonged recovery but finally improved.  History of knee surgery in 2004. Surgery for epicondylitis of elbow in 2002. Bilateral tubal ligation 1986. 3 pregnancies and no miscarriages.  Had epidural steroid injection L5-S1 in May. Has seen Dr. Letha in July of this year. The pain she was having at that time was over SI joint and had onset after attending a graduation. Had onset of acute pain when standing up during the graduation.   Labs 07/20/2024 absolute Eosinophils 631, Blood glucose 117, CHOL 328, HDL 46, Triglycerides 240, LDL 238.   07/02/2023 No mammographic evidence of malignancy. Repeat in one year.    05/01/2022 Bone density Left Femur neck BMD 0.583 T-score -2.40.    05/17/2019 Colonoscopy Three diminutive polyps in the proximal transverse colon and in the cecum. Resected and retrieved. Pathology found to be adenomas.  Diverticulosis in the sigmoid colon. The examination was otherwise normal on direct and retroflexion views. Personal history of colonic polyps. Repeat due in 5 years.   Vaccine counseling: Tetanus, Influenza, and Pneumonia vaccine due.  Health Maintenance  Topic Date Due   DTaP/Tdap/Td (3 - Td or Tdap) 07/18/2022   Medicare Annual Wellness (AWV)  09/10/2023   Influenza Vaccine  04/28/2024   Colonoscopy  05/16/2024   Mammogram  06/30/2025   Pneumococcal Vaccine: 50+ Years  Completed   DEXA SCAN  Completed   Meningococcal B Vaccine  Aged Out   COVID-19 Vaccine  Discontinued   Hepatitis C Screening  Discontinued   Zoster Vaccines- Shingrix  Discontinued     Review of Systems  Constitutional:  Negative for fever and malaise/fatigue.  HENT:  Negative for congestion.   Eyes:  Negative for blurred vision.  Respiratory:  Negative for cough and shortness of breath.   Cardiovascular:  Negative for chest pain, palpitations and leg swelling.  Gastrointestinal:  Negative for vomiting.  Musculoskeletal:  Negative for back pain.  Skin:  Negative for rash.  Neurological:  Negative for loss of consciousness and headaches.   Objective:  Vitals: body mass index is 29.07 kg/m. Today's Vitals   07/24/24 1455  BP: 136/82  Pulse: 76  Temp: 98 F (36.7 C)  TempSrc: Tympanic  SpO2: 97%  Weight: 172 lb (78 kg)  Height: 5' 4.5 (1.638 m)   Physical Exam Vitals and nursing note reviewed.  Constitutional:      General: She is not in acute distress.    Appearance: Normal appearance. She is not ill-appearing or toxic-appearing.  HENT:     Head: Normocephalic and atraumatic.     Right Ear: Hearing, tympanic membrane, ear canal and external ear normal.     Left Ear: Hearing, tympanic membrane, ear canal and external ear normal.     Mouth/Throat:     Pharynx: Oropharynx is clear.  Eyes:     Extraocular Movements: Extraocular movements intact.     Pupils: Pupils are equal, round, and reactive to light.  Neck:     Thyroid : No thyroid  mass, thyromegaly or thyroid  tenderness.     Vascular: No carotid bruit.  Cardiovascular:     Rate and Rhythm: Normal rate and regular rhythm. No extrasystoles are present.    Pulses:          Dorsalis pedis pulses are 2+ on the right side and 2+ on the left side.     Heart sounds: Normal heart sounds. No murmur heard.    No friction rub. No gallop.  Pulmonary:     Effort: Pulmonary effort is normal.     Breath sounds: Normal breath sounds. No decreased breath sounds, wheezing, rhonchi or rales.  Chest:     Chest wall: No mass.  Abdominal:     Palpations: Abdomen is soft. There is no hepatomegaly, splenomegaly or  mass.     Tenderness: There is no abdominal tenderness.     Hernia: No hernia is present.  Musculoskeletal:     Cervical back: Normal range of motion.     Right lower leg: No edema.     Left lower leg: No edema.  Lymphadenopathy:     Cervical: No cervical adenopathy.     Upper Body:     Right upper body: No supraclavicular adenopathy.     Left upper body: No supraclavicular adenopathy.  Skin:    General: Skin is warm and dry.  Neurological:     General: No focal deficit present.     Mental Status: She is alert and oriented to person, place, and time. Mental status is at baseline.     Sensory: Sensation is intact.     Motor: Motor function is intact. No weakness.     Deep Tendon Reflexes: Reflexes are normal and symmetric.  Psychiatric:  Attention and Perception: Attention normal.        Mood and Affect: Mood normal.        Speech: Speech normal.        Behavior: Behavior normal.        Thought Content: Thought content normal.        Cognition and Memory: Cognition normal.        Judgment: Judgment normal.     Current Outpatient Medications  Medication Instructions   acetaminophen  (TYLENOL ) 500 mg, Every 6 hours PRN   ALPRAZolam (XANAX) 0.5 mg, Oral, 2 times daily PRN   diltiazem  (CARDIZEM  CD) 240 MG 24 hr capsule TAKE 1 CAPSULE(240 MG) BY MOUTH DAILY   Eliquis  5 mg, Oral, 2 times daily   metoprolol  succinate (TOPROL -XL) 25 mg, Oral, Daily, PT. MUST MAKE AN APPOINTMENT IN ORDER TO RECEIVE ADDITIONAL REFILLS, FIRST ATTEMPT.   rosuvastatin  (CRESTOR ) 20 MG tablet TAKE 1 TABLET BY MOUTH EVERY DAY   sertraline  (ZOLOFT ) 50 MG tablet TAKE 1 TABLET(50 MG) BY MOUTH DAILY   Past Medical History:  Diagnosis Date   Chronic headaches    Depression    Diastolic dysfunction    a. 12/2013 Echo: EF 60-65%, Gr1 DD, Ao sclerosis w/o stenosis, triv MR, mod dil LA, mild TR.   Diverticulosis    GERD (gastroesophageal reflux disease)    History of hiatal hernia    with repair    History of stress test    a. 10/2008 MV: Ef 75%, nl perfusion.   HTN (hypertension)    Hyperlipidemia    Migraine    Obesity    Osteopenia    PAF (paroxysmal atrial fibrillation) (HCC)    a. Anticoagulated w/ Eliquis  (CHA2DS2VASc = 3).   Medical/Surgical History Narrative:  Allergic/Intolerant to:  Allergies  Allergen Reactions   Erythromycin Hives   Statins     Lipitor   Codeine Hives   Hctz [Hydrochlorothiazide] Rash   Lipitor [Atorvastatin] Other (See Comments)    Joint pain    Past Surgical History:  Procedure Laterality Date   CATARACT EXTRACTION, BILATERAL     COLONOSCOPY     ELBOW SURGERY  left   Tendon Surgery   ESOPHAGEAL MANOMETRY N/A 02/23/2018   Procedure: ESOPHAGEAL MANOMETRY (EM);  Surgeon: Shila Gustav GAILS, MD;  Location: [facility];  Service: Endoscopy;  Laterality: N/A;   EYE SURGERY Left    L-yey myopia   HERNIA REPAIR  2008   nissan fundiplication   KNEE ARTHROSCOPY  2005   R-knee   LAPAROSCOPIC NISSEN FUNDOPLICATION     ROTATOR CUFF REPAIR Right    TONSILLECTOMY     TUBAL LIGATION     UPPER GASTROINTESTINAL ENDOSCOPY     Family History  Problem Relation Age of Onset   COPD Mother    Asthma Mother    Arthritis Father    Hypertension Father    Stroke Father    Cancer Father    Prostate cancer Father    Hypertension Sister    Diabetes Sister    Hypertension Brother    Cancer Sister        UTERINE   Cancer Son        TESTICULAR   Crohn's disease Daughter    Pancreatic cancer Other    Colon cancer Cousin    Esophageal cancer Neg Hx    Stomach cancer Neg Hx    Liver disease Neg Hx    Rectal cancer Neg Hx    Family History Narrative:  Daughter with history of Crohn's disease. Mother died of complications of COPD. Father deceased with history of dementia, hypertension and stroke. Sister with history of diabetes, hypertension and has had bariatric surgery. Sister with history of uterine cancer. 1 brother with history of kidney stones,  hypertension, gout and allergies.  Social History   Social History Narrative   Divorced, 3 children lives alone   She is a retired librarian, 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[employer]   Former smoker no alcohol no drug use   Most Recent Health Risks Assessment:   Medicare Risk at Home - 07/24/24 1453     Any stairs in or around the home? No    If so, are there any without handrails? No    Home free of loose throw rugs in walkways, pet beds, electrical cords, etc? Yes    Adequate lighting in your home to reduce risk of falls? Yes    Life alert? No    Use of a cane, walker or w/c? No    Grab bars in the bathroom? Yes    Shower chair or bench in shower? Yes    Elevated toilet seat or a handicapped toilet? Yes         Most Recent Social Determinants of Health (Including Hx of Tobacco, Alcohol, and Drug Use) SDOH Screenings   Food Insecurity: No Food Insecurity (07/20/2024)  Housing: Low Risk  (07/20/2024)  Transportation Needs: No Transportation Needs (07/20/2024)  Utilities: Not At Risk (03/25/2023)  Alcohol Screen: Low Risk  (09/09/2022)  Depression (PHQ2-9): Low Risk  (07/24/2024)  Financial Resource Strain: Low Risk  (07/20/2024)  Physical Activity: Unknown (07/20/2024)  Recent Concern: Physical Activity - Insufficiently Active (06/15/2024)  Social Connections: Moderately Integrated (07/20/2024)  Stress: Stress Concern Present (07/20/2024)  Tobacco Use: Medium Risk (03/16/2024)   Social History   Tobacco Use   Smoking status: Former    Current packs/day: 0.00    Types: Cigarettes    Quit date: 09/07/1971    Years since quitting: 52.9   Smokeless tobacco: Never  Vaping Use   Vaping status: Never Used  Substance Use Topics   Alcohol use: Not Currently   Drug use: No   Most Recent Functional Status Assessment:    07/24/2024    2:51 PM  In your present state of health, do you have any difficulty performing the following activities:  Hearing? 0  Vision? 0  Difficulty  concentrating or making decisions? 0  Walking or climbing stairs? 1  Dressing or bathing? 0  Doing errands, shopping? 0  Preparing Food and eating ? N  Using the Toilet? N  In the past six months, have you accidently leaked urine? N  Do you have problems with loss of bowel control? N  Managing your Medications? N  Managing your Finances? N  Housekeeping or managing your Housekeeping? N   Most Recent Fall Risk Assessment:    07/24/2024    2:53 PM  Fall Risk   Falls in the past year? 1  Number falls in past yr: 0  Injury with Fall? 1  Risk for fall due to : History of fall(s)  Follow up Falls evaluation completed;Falls prevention discussed   Most Recent Anxiety/Depression Screenings:    07/24/2024    2:54 PM 03/16/2024   11:09 AM  PHQ 2/9 Scores  PHQ - 2 Score 1 0    Most Recent Cognitive Screening:    07/24/2024    2:54 PM  6CIT Screen  What Year? 0  points  What month? 0 points  What time? 0 points  Count back from 20 0 points  Months in reverse 0 points  Repeat phrase 0 points  Total Score 0 points    Results:  Studies Obtained And Personally Reviewed By Me:  07/02/2023 No mammographic evidence of malignancy. Repeat in one year.    05/01/2022 Bone density L Femur neck BMD 0.583 T-score -2.40.    05/17/2019 Colonoscopy Three diminutive polyps in the proximal transverse colon and in the cecum. Resected and retrieved. Pathology found to be adenomas.  Diverticulosis in the sigmoid colon. The examination was otherwise normal on direct and retroflexion views. Personal history of colonic polyps. Repeat due in 5 years.   Labs:  CBC w/ Differential Lab Results  Component Value Date   WBC 8.3 07/20/2024   RBC 4.33 07/20/2024   HGB 13.2 07/20/2024   HCT 39.7 07/20/2024   PLT 349 07/20/2024   MCV 91.7 07/20/2024   MCH 30.5 07/20/2024   MCHC 33.2 07/20/2024   RDW 11.8 07/20/2024   MPV 9.8 07/20/2024   LYMPHSABS 1.9 03/13/2024   MONOABS 0.6 03/13/2024    BASOSABS 108 07/20/2024    Comprehensive Metabolic Panel Lab Results  Component Value Date   NA 141 07/20/2024   K 4.1 07/20/2024   CL 105 07/20/2024   CO2 27 07/20/2024   GLUCOSE 117 (H) 07/20/2024   BUN 14 07/20/2024   CREATININE 0.84 07/20/2024   CALCIUM  9.4 07/20/2024   PROT 6.8 07/20/2024   ALBUMIN 4.3 03/13/2024   AST 15 07/20/2024   ALT 12 07/20/2024   ALKPHOS 51 03/13/2024   BILITOT 0.6 07/20/2024   GFR 63.20 04/17/2021   EGFR 73 07/20/2024   GFRNONAA >60 03/13/2024   Lipid Panel  Lab Results  Component Value Date   CHOL 328 (H) 07/20/2024   HDL 46 (L) 07/20/2024   LDLCALC 238 (H) 07/20/2024   TRIG 240 (H) 07/20/2024   A1c Lab Results  Component Value Date   HGBA1C 5.5 07/20/2024    TSH Lab Results  Component Value Date   TSH 2.65 07/20/2024    Assessment & Plan:   Hyperlipidemia: treated with rosuvastatin  20 mg daily. 07/20/2024 Lipid Panel CHOL 328, HDL 46, Triglycerides 240, LDL 238. She Was walking up the stairs and her leg gave out which happened when she was on a previously on a different statin so she stopped taking it. She said that she has changed her diet and is trying to lose weight.   Hypertension: treated with Metoprolol  Succinate 25 mg daily.  Blood pressure today is normal at 136/82.   Anxiety and depression: treated with Zoloft  50 mg daily.  She is experiencing some situational stress due to her children's health issues and taking care of her grandchildren.    Xanax 0.5 mg twice daily prescribed.  Paroxysmal Atrial Fibrillation: treated with Cardizem  240 mg daily and Eliquis  5 mg daily.   07/02/2023 No mammographic evidence of malignancy. Repeat in one year.    05/01/2022 Bone density Left Femur neck BMD 0.583 T-score -2.40.    05/17/2019 Colonoscopy Three diminutive polyps in the proximal transverse colon and in the cecum. Resected and retrieved. Pathology found to be adenomas.  Diverticulosis in the sigmoid colon. The examination was  otherwise normal on direct and retroflexion views. Personal history of colonic polyps. Repeat due in 5 years.   Vaccine counseling: Tetanus, Influenza, and Pneumonia vaccine due.   Lumbar spinal stenosis  Lumbar back pain and has  had recent fall. Can refer to PT if pt desires.     Annual Wellness Visit done today including the all of the following: Reviewed patient's Family Medical History Reviewed patient's SDOH and reviewed tobacco, alcohol, and drug use.  Reviewed and updated list of patient's medical providers Assessment of cognitive impairment was done Assessed patient's functional ability Established a written schedule for health screening services Health Risk Assessent Completed and Reviewed  Discussed health benefits of physical activity, and encouraged her to engage in regular exercise appropriate for her age and condition.    I,Makayla C Reid,acting as a scribe for Ronal JINNY Hailstone, MD.,have documented all relevant documentation on the behalf of Ronal JINNY Hailstone, MD,as directed by  Ronal JINNY Hailstone, MD while in the presence of Ronal JINNY Hailstone, MD.  I, Ronal JINNY Hailstone, MD, have reviewed all documentation for and agree with the above Annual Wellness Visit documentation.  Ronal JINNY Hailstone, MD Internal Medicine 07/24/2024

## 2024-07-21 LAB — HEMOGLOBIN A1C
Hgb A1c MFr Bld: 5.5 % (ref <5.7)
Mean Plasma Glucose: 111 mg/dL
eAG (mmol/L): 6.2 mmol/L

## 2024-07-21 LAB — CBC WITH DIFFERENTIAL/PLATELET
Absolute Lymphocytes: 2324 {cells}/uL (ref 850–3900)
Absolute Monocytes: 515 {cells}/uL (ref 200–950)
Basophils Absolute: 108 {cells}/uL (ref 0–200)
Basophils Relative: 1.3 %
Eosinophils Absolute: 631 {cells}/uL — ABNORMAL HIGH (ref 15–500)
Eosinophils Relative: 7.6 %
HCT: 39.7 % (ref 35.0–45.0)
Hemoglobin: 13.2 g/dL (ref 11.7–15.5)
MCH: 30.5 pg (ref 27.0–33.0)
MCHC: 33.2 g/dL (ref 32.0–36.0)
MCV: 91.7 fL (ref 80.0–100.0)
MPV: 9.8 fL (ref 7.5–12.5)
Monocytes Relative: 6.2 %
Neutro Abs: 4723 {cells}/uL (ref 1500–7800)
Neutrophils Relative %: 56.9 %
Platelets: 349 Thousand/uL (ref 140–400)
RBC: 4.33 Million/uL (ref 3.80–5.10)
RDW: 11.8 % (ref 11.0–15.0)
Total Lymphocyte: 28 %
WBC: 8.3 Thousand/uL (ref 3.8–10.8)

## 2024-07-21 LAB — LIPID PANEL
Cholesterol: 328 mg/dL — ABNORMAL HIGH (ref <200)
HDL: 46 mg/dL — ABNORMAL LOW (ref >=50)
LDL Cholesterol (Calc): 238 mg/dL — ABNORMAL HIGH
Non-HDL Cholesterol (Calc): 282 mg/dL — ABNORMAL HIGH (ref <130)
Total CHOL/HDL Ratio: 7.1 (calc) — ABNORMAL HIGH (ref <5.0)
Triglycerides: 240 mg/dL — ABNORMAL HIGH (ref <150)

## 2024-07-21 LAB — COMPREHENSIVE METABOLIC PANEL WITH GFR
AG Ratio: 1.8 (calc) (ref 1.0–2.5)
ALT: 12 U/L (ref 6–29)
AST: 15 U/L (ref 10–35)
Albumin: 4.4 g/dL (ref 3.6–5.1)
Alkaline phosphatase (APISO): 49 U/L (ref 37–153)
BUN: 14 mg/dL (ref 7–25)
CO2: 27 mmol/L (ref 20–32)
Calcium: 9.4 mg/dL (ref 8.6–10.4)
Chloride: 105 mmol/L (ref 98–110)
Creat: 0.84 mg/dL (ref 0.60–1.00)
Globulin: 2.4 g/dL (ref 1.9–3.7)
Glucose, Bld: 117 mg/dL — ABNORMAL HIGH (ref 65–99)
Potassium: 4.1 mmol/L (ref 3.5–5.3)
Sodium: 141 mmol/L (ref 135–146)
Total Bilirubin: 0.6 mg/dL (ref 0.2–1.2)
Total Protein: 6.8 g/dL (ref 6.1–8.1)
eGFR: 73 mL/min/1.73m2 (ref >=60)

## 2024-07-21 LAB — TSH: TSH: 2.65 m[IU]/L (ref 0.40–4.50)

## 2024-07-24 ENCOUNTER — Ambulatory Visit (INDEPENDENT_AMBULATORY_CARE_PROVIDER_SITE_OTHER): Admitting: Internal Medicine

## 2024-07-24 VITALS — BP 136/82 | HR 76 | Temp 98.0°F | Ht 64.5 in | Wt 172.0 lb

## 2024-07-24 DIAGNOSIS — F439 Reaction to severe stress, unspecified: Secondary | ICD-10-CM

## 2024-07-24 DIAGNOSIS — I480 Paroxysmal atrial fibrillation: Secondary | ICD-10-CM | POA: Diagnosis not present

## 2024-07-24 DIAGNOSIS — F5102 Adjustment insomnia: Secondary | ICD-10-CM

## 2024-07-24 DIAGNOSIS — E785 Hyperlipidemia, unspecified: Secondary | ICD-10-CM | POA: Diagnosis not present

## 2024-07-24 DIAGNOSIS — M545 Low back pain, unspecified: Secondary | ICD-10-CM

## 2024-07-24 DIAGNOSIS — Z8669 Personal history of other diseases of the nervous system and sense organs: Secondary | ICD-10-CM

## 2024-07-24 DIAGNOSIS — Z0000 Encounter for general adult medical examination without abnormal findings: Secondary | ICD-10-CM | POA: Diagnosis not present

## 2024-07-24 DIAGNOSIS — E782 Mixed hyperlipidemia: Secondary | ICD-10-CM

## 2024-07-24 DIAGNOSIS — Z7901 Long term (current) use of anticoagulants: Secondary | ICD-10-CM | POA: Diagnosis not present

## 2024-07-24 DIAGNOSIS — M48062 Spinal stenosis, lumbar region with neurogenic claudication: Secondary | ICD-10-CM

## 2024-07-24 DIAGNOSIS — F418 Other specified anxiety disorders: Secondary | ICD-10-CM | POA: Diagnosis not present

## 2024-07-24 DIAGNOSIS — W19XXXA Unspecified fall, initial encounter: Secondary | ICD-10-CM

## 2024-07-24 DIAGNOSIS — I10 Essential (primary) hypertension: Secondary | ICD-10-CM | POA: Diagnosis not present

## 2024-07-24 DIAGNOSIS — F32A Depression, unspecified: Secondary | ICD-10-CM

## 2024-07-24 DIAGNOSIS — K219 Gastro-esophageal reflux disease without esophagitis: Secondary | ICD-10-CM

## 2024-07-24 DIAGNOSIS — G4709 Other insomnia: Secondary | ICD-10-CM

## 2024-07-24 DIAGNOSIS — M8580 Other specified disorders of bone density and structure, unspecified site: Secondary | ICD-10-CM

## 2024-07-24 LAB — POC URINALSYSI DIPSTICK (AUTOMATED)
Bilirubin, UA: NEGATIVE
Blood, UA: NEGATIVE
Glucose, UA: NEGATIVE
Ketones, UA: NEGATIVE
Nitrite, UA: NEGATIVE
Protein, UA: POSITIVE — AB
Spec Grav, UA: 1.025 (ref 1.010–1.025)
Urobilinogen, UA: 0.2 U/dL
pH, UA: 6 (ref 5.0–8.0)

## 2024-07-24 MED ORDER — ALPRAZOLAM 0.5 MG PO TABS: 0.5000 mg | ORAL_TABLET | Freq: Two times a day (BID) | ORAL | 0 refills | Status: DC | PRN

## 2024-07-25 ENCOUNTER — Telehealth: Payer: Self-pay | Admitting: Internal Medicine

## 2024-07-25 NOTE — Telephone Encounter (Signed)
 Inbound call from patient requesting to have us  start her prior Auth for her colonoscopy scheduled for tomorrow. Patient is requesting to speak to someone before her procedure tomorrow. Please advise.

## 2024-07-26 ENCOUNTER — Ambulatory Visit (AMBULATORY_SURGERY_CENTER): Admitting: Internal Medicine

## 2024-07-26 ENCOUNTER — Telehealth: Payer: Self-pay | Admitting: Internal Medicine

## 2024-07-26 ENCOUNTER — Encounter: Payer: Self-pay | Admitting: Internal Medicine

## 2024-07-26 VITALS — BP 147/77 | HR 76 | Temp 97.9°F | Resp 16 | Ht 64.0 in | Wt 171.0 lb

## 2024-07-26 DIAGNOSIS — Z860100 Personal history of colon polyps, unspecified: Secondary | ICD-10-CM

## 2024-07-26 DIAGNOSIS — Z1211 Encounter for screening for malignant neoplasm of colon: Secondary | ICD-10-CM

## 2024-07-26 DIAGNOSIS — D120 Benign neoplasm of cecum: Secondary | ICD-10-CM

## 2024-07-26 DIAGNOSIS — D123 Benign neoplasm of transverse colon: Secondary | ICD-10-CM

## 2024-07-26 DIAGNOSIS — K5730 Diverticulosis of large intestine without perforation or abscess without bleeding: Secondary | ICD-10-CM | POA: Diagnosis not present

## 2024-07-26 DIAGNOSIS — Z860101 Personal history of adenomatous and serrated colon polyps: Secondary | ICD-10-CM

## 2024-07-26 MED ORDER — SODIUM CHLORIDE 0.9 % IV SOLN: 500.0000 mL | Freq: Once | INTRAVENOUS | Status: DC

## 2024-07-26 NOTE — Progress Notes (Signed)
 History and Physical Interval Note:  07/26/2024 2:34 PM  Olivia Reynolds  has presented today for endoscopic procedure(s), with the diagnosis of  Encounter Diagnosis  Name Primary?   History of colonic polyps Yes  .  The various methods of evaluation and treatment have been discussed with the patient and/or family. After consideration of risks, benefits and other options for treatment, the patient has consented to  the endoscopic procedure(s).   The patient's history has been reviewed, patient examined, no change in status, stable for endoscopic procedure(s).  I have reviewed the patient's chart and labs.  Questions were answered to the patient's satisfaction.     Lupita CHARLENA Commander, MD, NOLIA

## 2024-07-26 NOTE — Progress Notes (Signed)
 Sedate, gd SR, tolerated procedure well, VSS, report to RN

## 2024-07-26 NOTE — Progress Notes (Signed)
 Called to room to assist during endoscopic procedure.  Patient ID and intended procedure confirmed with present staff. Received instructions for my participation in the procedure from the performing physician.

## 2024-07-26 NOTE — Progress Notes (Signed)
 Pt's states no medical or surgical changes since previsit or office visit.

## 2024-07-26 NOTE — Patient Instructions (Addendum)
 I found and removed 4 polyps today.  They all look benign as before.  I will have them analyzed and consider whether or not it makes sense to repeat a routine colonoscopy depending upon what the results are.   I appreciate the opportunity to care for you. Lupita CHARLENA Commander, MD, FACG  YOU HAD AN ENDOSCOPIC PROCEDURE TODAY AT THE [facility]:   Refer to the procedure report that was given to you for any specific questions about what was found during the examination.  If the procedure report does not answer your questions, please call your gastroenterologist to clarify.  If you requested that your care partner not be given the details of your procedure findings, then the procedure report has been included in a sealed envelope for you to review at your convenience later.  YOU SHOULD EXPECT: Some feelings of bloating in the abdomen. Passage of more gas than usual.  Walking can help get rid of the air that was put into your GI tract during the procedure and reduce the bloating. If you had a lower endoscopy (such as a colonoscopy or flexible sigmoidoscopy) you may notice spotting of blood in your stool or on the toilet paper. If you underwent a bowel prep for your procedure, you may not have a normal bowel movement for a few days.  Please Note:  You might notice some irritation and congestion in your nose or some drainage.  This is from the oxygen used during your procedure.  There is no need for concern and it should clear up in a day or so.  SYMPTOMS TO REPORT IMMEDIATELY:  Following lower endoscopy (colonoscopy or flexible sigmoidoscopy):  Excessive amounts of blood in the stool  Significant tenderness or worsening of abdominal pains  Swelling of the abdomen that is new, acute  Fever of 100F or higher  Resume previous diet Continue present medications Resume Eliquis  at prior dose tomorrow Await pathology results Handouts on polyps and diverticulosis given   For urgent or emergent  issues, a gastroenterologist can be reached at any hour by calling [phone]. Do not use MyChart messaging for urgent concerns.    DIET:  We do recommend a small meal at first, but then you may proceed to your regular diet.  Drink plenty of fluids but you should avoid alcoholic beverages for 24 hours.  ACTIVITY:  You should plan to take it easy for the rest of today and you should NOT DRIVE or use heavy machinery until tomorrow (because of the sedation medicines used during the test).    FOLLOW UP: Our staff will call the number listed on your records the next business day following your procedure.  We will call around 7:15- 8:00 am to check on you and address any questions or concerns that you may have regarding the information given to you following your procedure. If we do not reach you, we will leave a message.     If any biopsies were taken you will be contacted by phone or by letter within the next 1-3 weeks.  Please call us  at [phone] if you have not heard about the biopsies in 3 weeks.    SIGNATURES/CONFIDENTIALITY: You and/or your care partner have signed paperwork which will be entered into your electronic medical record.  These signatures attest to the fact that that the information above on your After Visit Summary has been reviewed and is understood.  Full responsibility of the confidentiality of this discharge information lies with you  and/or your care-partner.

## 2024-07-26 NOTE — Op Note (Signed)
 [facility] Patient Name: Olivia Reynolds Procedure Date: 07/26/2024 2:27 PM MRN: [id number] Endoscopist: Lupita FORBES Commander , MD, [id number] Age: 73 Referring MD:  Date of Birth: [birth year] Gender: Female Account #: [id number] Procedure:                Colonoscopy Indications:              Surveillance: Personal history of adenomatous                            polyps on last colonoscopy 5 years ago, Last                            colonoscopy: August 2020 Medicines:                Monitored Anesthesia Care Procedure:                Pre-Anesthesia Assessment:                           - Prior to the procedure, a History and Physical                            was performed, and patient medications and                            allergies were reviewed. The patient's tolerance of                            previous anesthesia was also reviewed. The risks                            and benefits of the procedure and the sedation                            options and risks were discussed with the patient.                            All questions were answered, and informed consent                            was obtained. Prior Anticoagulants: The patient                            last took Eliquis  (apixaban ) 2 days prior to the                            procedure. ASA Grade Assessment: II - A patient                            with mild systemic disease. After reviewing the                            risks and benefits, the patient was deemed in  satisfactory condition to undergo the procedure.                           After obtaining informed consent, the colonoscope                            was passed under direct vision. Throughout the                            procedure, the patient's blood pressure, pulse, and                            oxygen saturations were monitored continuously. The                            CF [id number] #[id number] was  introduced through the anus                            and advanced to the the cecum, identified by                            appendiceal orifice and ileocecal valve. The                            colonoscopy was performed without difficulty. The                            patient tolerated the procedure well. The quality                            of the bowel preparation was good. The ileocecal                            valve, appendiceal orifice, and rectum were                            photographed. The bowel preparation used was                            Miralax via split dose instruction. Scope In: 2:39:03 PM Scope Out: 2:57:54 PM Scope Withdrawal Time: 0 hours 14 minutes 5 seconds  Total Procedure Duration: 0 hours 18 minutes 51 seconds  Findings:                 The perianal and digital rectal examinations were                            normal.                           Four sessile polyps were found in the transverse                            colon and cecum. The polyps were 3 to 12 mm in  size. These polyps were removed with a cold snare.                            Resection and retrieval were complete. Verification                            of patient identification for the specimen was                            done. Estimated blood loss was minimal.                           Multiple diverticula were found in the sigmoid                            colon.                           The exam was otherwise without abnormality on                            direct and retroflexion views. Complications:            No immediate complications. Estimated Blood Loss:     Estimated blood loss was minimal. Impression:               - Four 3 to 12 mm polyps in the transverse colon                            and in the cecum, removed with a cold snare.                            Resected and retrieved.                           - Diverticulosis in the  sigmoid colon.                           - The examination was otherwise normal on direct                            and retroflexion views.                           - Personal history of colonic polyps.                           2003 - polyp no pathology specimen                           2008 - 5 mm rightt hyperplastic polyp                           08/2012 - diminutive hyperplastic polyps removed  ascending and sigmoid                           05/17/2019 3 diminutive right colon polyps -                            adenomas recall 2025 Recommendation:           - Patient has a contact number available for                            emergencies. The signs and symptoms of potential                            delayed complications were discussed with the                            patient. Return to normal activities tomorrow.                            Written discharge instructions were provided to the                            patient.                           - Resume previous diet.                           - Continue present medications.                           - Resume Eliquis  (apixaban ) at prior dose tomorrow.                           - Await pathology results. Lupita FORBES Commander, MD 07/26/2024 3:10:03 PM This report has been signed electronically.

## 2024-07-27 ENCOUNTER — Telehealth: Payer: Self-pay

## 2024-07-27 NOTE — Telephone Encounter (Signed)
 Left message on follow up call.

## 2024-07-27 NOTE — Progress Notes (Incomplete)
 Annual Wellness Visit   Patient Care Team: Kenniya Westrich, Ronal PARAS, MD as PCP - General (Internal Medicine) Pietro Redell RAMAN, MD as PCP - Cardiology (Cardiology) Pietro Redell RAMAN, MD as Consulting Physician (Cardiology)  Visit Date: 07/24/24   Chief Complaint  Patient presents with  . Annual Exam   Subjective:  Patient: Olivia Reynolds, Female DOB: [birth year], 74 y.o. MRN: [id number] Vitals:   07/24/24 1455  BP: 136/82   Olivia Reynolds is a 73 y.o. Female who presents today for her Annual Wellness Visit. 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  For the past two weeks her back has been hurting. She has been having stomach issues and she hasn't been sleeping well and she has been very fatigued.   This year, she was taking her daughter's dog out and the dog took off running and pulled her with it and she hit her head in the drive way.     History of Hyperlipidemia treated with rosuvastatin  20 mg daily. 07/20/2024 Lipid Panel CHOL 328, HDL 46, Triglycerides 240, LDL 238. She Was walking up the stairs and her leg gave out which happened when she was on a previously on a different statin so she stopped taking it. She said that she has changed her diet and is trying to lose weight.   History of Hypertension treated with Metoprolol  Succinate 25 mg daily.  Blood pressure today is normal at 136/82.   History of Anxiety and depression treated with Zoloft  50 mg daily.  She is experiencing some  situational stress due to her children's health issues and taking care of her grandchildren.   History of Paroxysmal Atrial Fibrillation treated with Cardizem  240 mg daily and Eliquis  5 mg daily.   History of severe back pain in the Spring 2021. She saw Dr. Malcolm. An MRI of the LS spine showed right L4-L5 lateral recess stenosis. She had epidural steroid injections after a trial of oral steroids. Had prolonged recovery but finally improved.  History of knee surgery in 2004. Surgery for epicondylitis of elbow in 2002. Bilateral tubal ligation 1986. 3 pregnancies and no miscarriages.  Had epidural steroid injection L5-S1 in May. Has seen Dr. Letha in July of this year. The pain she was having at that time was over SI joint and had onset after attending a graduation. Had onset of acute pain when standing up during the graduation.   Labs 07/20/2024 absolute Eosinophils 631, Blood glucose 117, CHOL 328, HDL 46, Triglycerides 240, LDL 238.   07/02/2023 No mammographic evidence of malignancy. Repeat in one year.    05/01/2022 Bone density Left Femur neck BMD 0.583 T-score -2.40.    05/17/2019 Colonoscopy Three diminutive polyps in the proximal transverse colon and in the cecum. Resected and retrieved. Pathology found to be adenomas.  Diverticulosis in the sigmoid colon. The examination was otherwise normal on direct and retroflexion views. Personal history of colonic polyps. Repeat due in 5 years.   Vaccine counseling: Tetanus, Influenza, and Pneumonia vaccine due.  Health Maintenance  Topic Date Due  . DTaP/Tdap/Td (3 - Td or Tdap) 07/18/2022  . Medicare Annual Wellness (AWV)  09/10/2023  . Influenza Vaccine  04/28/2024  . Colonoscopy  05/16/2024  . Mammogram  06/30/2025  . Pneumococcal Vaccine: 50+ Years  Completed  . DEXA SCAN  Completed  . Meningococcal B Vaccine  Aged Out  . COVID-19 Vaccine  Discontinued  . Hepatitis C Screening  Discontinued  . Zoster Vaccines- Shingrix  Discontinued     Review of Systems  Constitutional:  Negative for fever and malaise/fatigue.  HENT:  Negative for congestion.   Eyes:  Negative for blurred vision.  Respiratory:  Negative for cough and shortness of breath.   Cardiovascular:  Negative for chest pain, palpitations and leg swelling.  Gastrointestinal:  Negative for vomiting.  Musculoskeletal:  Negative for back pain.  Skin:  Negative for rash.  Neurological:  Negative for loss of consciousness and headaches.   Objective:  Vitals: body mass index is 29.07 kg/m. Today's Vitals   07/24/24 1455  BP: 136/82  Pulse: 76  Temp: 98 F (36.7 C)  TempSrc: Tympanic  SpO2: 97%  Weight: 172 lb (78 kg)  Height: 5' 4.5 (1.638 m)   Physical Exam Vitals and nursing note reviewed.  Constitutional:      General: She is not in acute distress.    Appearance: Normal appearance. She is not ill-appearing or toxic-appearing.  HENT:     Head: Normocephalic and atraumatic.     Right Ear: Hearing, tympanic membrane, ear canal and external ear normal.     Left Ear: Hearing, tympanic membrane, ear canal and external ear normal.     Mouth/Throat:     Pharynx: Oropharynx is clear.  Eyes:     Extraocular Movements: Extraocular movements intact.     Pupils: Pupils are equal, round, and reactive to light.  Neck:     Thyroid : No thyroid  mass, thyromegaly or thyroid  tenderness.     Vascular: No carotid bruit.  Cardiovascular:     Rate and Rhythm: Normal rate and regular rhythm. No extrasystoles are present.    Pulses:          Dorsalis pedis pulses are 2+ on the right side and 2+ on the left side.     Heart sounds: Normal heart sounds. No murmur heard.    No friction rub. No gallop.  Pulmonary:     Effort: Pulmonary effort is normal.     Breath sounds: Normal breath sounds. No decreased breath sounds, wheezing, rhonchi or rales.  Chest:     Chest wall: No mass.  Abdominal:     Palpations: Abdomen is soft. There is no hepatomegaly, splenomegaly or  mass.     Tenderness: There is no abdominal tenderness.     Hernia: No hernia is present.  Musculoskeletal:     Cervical back: Normal range of motion.     Right lower leg: No edema.     Left lower leg: No edema.  Lymphadenopathy:     Cervical: No cervical adenopathy.     Upper Body:     Right upper body: No supraclavicular adenopathy.     Left upper body: No supraclavicular adenopathy.  Skin:    General: Skin is warm and dry.  Neurological:     General: No focal deficit present.     Mental Status: She is alert and oriented to person, place, and time. Mental status is at baseline.     Sensory: Sensation is intact.     Motor: Motor function is intact. No weakness.     Deep Tendon Reflexes: Reflexes are normal and symmetric.  Psychiatric:  Attention and Perception: Attention normal.        Mood and Affect: Mood normal.        Speech: Speech normal.        Behavior: Behavior normal.        Thought Content: Thought content normal.        Cognition and Memory: Cognition normal.        Judgment: Judgment normal.     Current Outpatient Medications  Medication Instructions  . acetaminophen  (TYLENOL ) 500 mg, Every 6 hours PRN  . ALPRAZolam (XANAX) 0.5 mg, Oral, 2 times daily PRN  . diltiazem  (CARDIZEM  CD) 240 MG 24 hr capsule TAKE 1 CAPSULE(240 MG) BY MOUTH DAILY  . Eliquis  5 mg, Oral, 2 times daily  . metoprolol  succinate (TOPROL -XL) 25 mg, Oral, Daily, PT. MUST MAKE AN APPOINTMENT IN ORDER TO RECEIVE ADDITIONAL REFILLS, FIRST ATTEMPT.  . rosuvastatin  (CRESTOR ) 20 MG tablet TAKE 1 TABLET BY MOUTH EVERY DAY  . sertraline  (ZOLOFT ) 50 MG tablet TAKE 1 TABLET(50 MG) BY MOUTH DAILY   Past Medical History:  Diagnosis Date  . Chronic headaches   . Depression   . Diastolic dysfunction    a. 12/2013 Echo: EF 60-65%, Gr1 DD, Ao sclerosis w/o stenosis, triv MR, mod dil LA, mild TR.  . Diverticulosis   . GERD (gastroesophageal reflux disease)   . History of hiatal hernia    with  repair  . History of stress test    a. 10/2008 MV: Ef 75%, nl perfusion.  SABRA HTN (hypertension)   . Hyperlipidemia   . Migraine   . Obesity   . Osteopenia   . PAF (paroxysmal atrial fibrillation) (HCC)    a. Anticoagulated w/ Eliquis  (CHA2DS2VASc = 3).   Medical/Surgical History Narrative:  Allergic/Intolerant to:  Allergies  Allergen Reactions  . Erythromycin Hives  . Statins     Lipitor  . Codeine Hives  . Hctz [Hydrochlorothiazide] Rash  . Lipitor [Atorvastatin] Other (See Comments)    Joint pain    Past Surgical History:  Procedure Laterality Date  . CATARACT EXTRACTION, BILATERAL    . COLONOSCOPY    . ELBOW SURGERY  left   Tendon Surgery  . ESOPHAGEAL MANOMETRY N/A 02/23/2018   Procedure: ESOPHAGEAL MANOMETRY (EM);  Surgeon: Shila Gustav GAILS, MD;  Location: [facility];  Service: Endoscopy;  Laterality: N/A;  . EYE SURGERY Left    L-yey myopia  . HERNIA REPAIR  2008   nissan fundiplication  . KNEE ARTHROSCOPY  2005   R-knee  . LAPAROSCOPIC NISSEN FUNDOPLICATION    . ROTATOR CUFF REPAIR Right   . TONSILLECTOMY    . TUBAL LIGATION    . UPPER GASTROINTESTINAL ENDOSCOPY     Family History  Problem Relation Age of Onset  . COPD Mother   . Asthma Mother   . Arthritis Father   . Hypertension Father   . Stroke Father   . Cancer Father   . Prostate cancer Father   . Hypertension Sister   . Diabetes Sister   . Hypertension Brother   . Cancer Sister        UTERINE  . Cancer Son        TESTICULAR  . Crohn's disease Daughter   . Pancreatic cancer Other   . Colon cancer Cousin   . Esophageal cancer Neg Hx   . Stomach cancer Neg Hx   . Liver disease Neg Hx   . Rectal cancer Neg Hx    Family History Narrative:  Daughter with history of Crohn's disease. Mother died of complications of COPD. Father deceased with history of dementia, hypertension and stroke. Sister with history of diabetes, hypertension and has had bariatric surgery. Sister with history of uterine  cancer. 1 brother with history of kidney stones, hypertension, gout and allergies.  Social History   Social History Narrative   Divorced, 3 children lives alone   She is a retired librarian, 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[employer]   Former smoker no alcohol no drug use   Most Recent Health Risks Assessment:   Medicare Risk at Home - 07/24/24 1453     Any stairs in or around the home? No    If so, are there any without handrails? No    Home free of loose throw rugs in walkways, pet beds, electrical cords, etc? Yes    Adequate lighting in your home to reduce risk of falls? Yes    Life alert? No    Use of a cane, walker or w/c? No    Grab bars in the bathroom? Yes    Shower chair or bench in shower? Yes    Elevated toilet seat or a handicapped toilet? Yes         Most Recent Social Determinants of Health (Including Hx of Tobacco, Alcohol, and Drug Use) SDOH Screenings   Food Insecurity: No Food Insecurity (07/20/2024)  Housing: Low Risk  (07/20/2024)  Transportation Needs: No Transportation Needs (07/20/2024)  Utilities: Not At Risk (03/25/2023)  Alcohol Screen: Low Risk  (09/09/2022)  Depression (PHQ2-9): Low Risk  (07/24/2024)  Financial Resource Strain: Low Risk  (07/20/2024)  Physical Activity: Unknown (07/20/2024)  Recent Concern: Physical Activity - Insufficiently Active (06/15/2024)  Social Connections: Moderately Integrated (07/20/2024)  Stress: Stress Concern Present (07/20/2024)  Tobacco Use: Medium Risk (03/16/2024)   Social History   Tobacco Use  . Smoking status: Former    Current packs/day: 0.00    Types: Cigarettes    Quit date: 09/07/1971    Years since quitting: 52.9  . Smokeless tobacco: Never  Vaping Use  . Vaping status: Never Used  Substance Use Topics  . Alcohol use: Not Currently  . Drug use: No   Most Recent Functional Status Assessment:    07/24/2024    2:51 PM  In your present state of health, do you have any difficulty performing the  following activities:  Hearing? 0  Vision? 0  Difficulty concentrating or making decisions? 0  Walking or climbing stairs? 1  Dressing or bathing? 0  Doing errands, shopping? 0  Preparing Food and eating ? N  Using the Toilet? N  In the past six months, have you accidently leaked urine? N  Do you have problems with loss of bowel control? N  Managing your Medications? N  Managing your Finances? N  Housekeeping or managing your Housekeeping? N   Most Recent Fall Risk Assessment:    07/24/2024    2:53 PM  Fall Risk   Falls in the past year? 1  Number falls in past yr: 0  Injury with Fall? 1  Risk for fall due to : History of fall(s)  Follow up Falls evaluation completed;Falls prevention discussed   Most Recent Anxiety/Depression Screenings:    07/24/2024    2:54 PM 03/16/2024   11:09 AM  PHQ 2/9 Scores  PHQ - 2 Score 1 0    Most Recent Cognitive Screening:    07/24/2024    2:54 PM  6CIT Screen  What Year? 0  points  What month? 0 points  What time? 0 points  Count back from 20 0 points  Months in reverse 0 points  Repeat phrase 0 points  Total Score 0 points    Results:  Studies Obtained And Personally Reviewed By Me:  07/02/2023 No mammographic evidence of malignancy. Repeat in one year.    05/01/2022 Bone density L Femur neck BMD 0.583 T-score -2.40.    05/17/2019 Colonoscopy Three diminutive polyps in the proximal transverse colon and in the cecum. Resected and retrieved. Pathology found to be adenomas.  Diverticulosis in the sigmoid colon. The examination was otherwise normal on direct and retroflexion views. Personal history of colonic polyps. Repeat due in 5 years.   Labs:  CBC w/ Differential Lab Results  Component Value Date   WBC 8.3 07/20/2024   RBC 4.33 07/20/2024   HGB 13.2 07/20/2024   HCT 39.7 07/20/2024   PLT 349 07/20/2024   MCV 91.7 07/20/2024   MCH 30.5 07/20/2024   MCHC 33.2 07/20/2024   RDW 11.8 07/20/2024   MPV 9.8 07/20/2024    LYMPHSABS 1.9 03/13/2024   MONOABS 0.6 03/13/2024   BASOSABS 108 07/20/2024    Comprehensive Metabolic Panel Lab Results  Component Value Date   NA 141 07/20/2024   K 4.1 07/20/2024   CL 105 07/20/2024   CO2 27 07/20/2024   GLUCOSE 117 (H) 07/20/2024   BUN 14 07/20/2024   CREATININE 0.84 07/20/2024   CALCIUM  9.4 07/20/2024   PROT 6.8 07/20/2024   ALBUMIN 4.3 03/13/2024   AST 15 07/20/2024   ALT 12 07/20/2024   ALKPHOS 51 03/13/2024   BILITOT 0.6 07/20/2024   GFR 63.20 04/17/2021   EGFR 73 07/20/2024   GFRNONAA >60 03/13/2024   Lipid Panel  Lab Results  Component Value Date   CHOL 328 (H) 07/20/2024   HDL 46 (L) 07/20/2024   LDLCALC 238 (H) 07/20/2024   TRIG 240 (H) 07/20/2024   A1c Lab Results  Component Value Date   HGBA1C 5.5 07/20/2024    TSH Lab Results  Component Value Date   TSH 2.65 07/20/2024    Assessment & Plan:   Hyperlipidemia: treated with rosuvastatin  20 mg daily. 07/20/2024 Lipid Panel CHOL 328, HDL 46, Triglycerides 240, LDL 238. She Was walking up the stairs and her leg gave out which happened when she was on a previously on a different statin so she stopped taking it. She said that she has changed her diet and is trying to lose weight.   Hypertension: treated with Metoprolol  Succinate 25 mg daily.  Blood pressure today is normal at 136/82.   Anxiety and depression: treated with Zoloft  50 mg daily.  She is experiencing some situational stress due to her children's health issues and taking care of her grandchildren.    Xanax 0.5 mg twice daily prescribed.  Paroxysmal Atrial Fibrillation: treated with Cardizem  240 mg daily and Eliquis  5 mg daily.   07/02/2023 No mammographic evidence of malignancy. Repeat in one year.    05/01/2022 Bone density Left Femur neck BMD 0.583 T-score -2.40.    05/17/2019 Colonoscopy Three diminutive polyps in the proximal transverse colon and in the cecum. Resected and retrieved. Pathology found to be adenomas.   Diverticulosis in the sigmoid colon. The examination was otherwise normal on direct and retroflexion views. Personal history of colonic polyps. Repeat due in 5 years.   Vaccine counseling: Tetanus, Influenza, and Pneumonia vaccine due.   Lumbar spinal stenosis  Lumbar back pain and has  had recent fall. Can refer to PT if pt desires.     Annual Wellness Visit done today including the all of the following: Reviewed patient's Family Medical History Reviewed patient's SDOH and reviewed tobacco, alcohol, and drug use.  Reviewed and updated list of patient's medical providers Assessment of cognitive impairment was done Assessed patient's functional ability Established a written schedule for health screening services Health Risk Assessent Completed and Reviewed  Discussed health benefits of physical activity, and encouraged her to engage in regular exercise appropriate for her age and condition.    I,Makayla C Reid,acting as a scribe for Ronal JINNY Hailstone, MD.,have documented all relevant documentation on the behalf of Ronal JINNY Hailstone, MD,as directed by  Ronal JINNY Hailstone, MD while in the presence of Ronal JINNY Hailstone, MD.  I, Ronal JINNY Hailstone, MD, have reviewed all documentation for and agree with the above Annual Wellness Visit documentation.  Ronal JINNY Hailstone, MD Internal Medicine 07/24/2024

## 2024-07-28 ENCOUNTER — Encounter: Payer: Self-pay | Admitting: Internal Medicine

## 2024-07-28 NOTE — Patient Instructions (Addendum)
 Can refer to PT for low back pain secondary to recent fall. Vaccines discussed. Colonoscopy to be done this week.Continue cardizem  and eliquis  for PAF hx. Continue cholesterol lowering medication. Xanax prescribed for anxiety.

## 2024-07-31 LAB — SURGICAL PATHOLOGY

## 2024-08-01 ENCOUNTER — Ambulatory Visit (HOSPITAL_BASED_OUTPATIENT_CLINIC_OR_DEPARTMENT_OTHER)

## 2024-08-02 ENCOUNTER — Encounter (HOSPITAL_BASED_OUTPATIENT_CLINIC_OR_DEPARTMENT_OTHER): Payer: Self-pay

## 2024-08-02 ENCOUNTER — Ambulatory Visit (HOSPITAL_BASED_OUTPATIENT_CLINIC_OR_DEPARTMENT_OTHER)

## 2024-08-03 ENCOUNTER — Ambulatory Visit: Payer: Self-pay | Admitting: Internal Medicine

## 2024-08-03 DIAGNOSIS — Z860100 Personal history of colon polyps, unspecified: Secondary | ICD-10-CM

## 2024-09-06 ENCOUNTER — Ambulatory Visit (HOSPITAL_BASED_OUTPATIENT_CLINIC_OR_DEPARTMENT_OTHER): Admitting: Obstetrics & Gynecology

## 2024-09-06 ENCOUNTER — Encounter (HOSPITAL_BASED_OUTPATIENT_CLINIC_OR_DEPARTMENT_OTHER): Payer: Self-pay | Admitting: Obstetrics & Gynecology

## 2024-09-06 VITALS — BP 122/81 | HR 72 | Ht 64.5 in | Wt 171.8 lb

## 2024-09-06 DIAGNOSIS — N950 Postmenopausal bleeding: Secondary | ICD-10-CM

## 2024-09-06 DIAGNOSIS — Z8049 Family history of malignant neoplasm of other genital organs: Secondary | ICD-10-CM

## 2024-09-06 DIAGNOSIS — Z719 Counseling, unspecified: Secondary | ICD-10-CM

## 2024-09-06 NOTE — Progress Notes (Signed)
 GYNECOLOGY  VISIT  CC:   discuss possible surgery   HPI: 73 y.o. G3P3 Divorced White or Caucasian female here to discuss hysterectomy.  Reports she is very anxious about developing ovarian cancer.  She has hx of PMP bleeding that was heavy with bleeding/passing clots.  She went to the ER and had ultrasound with endometrium of about 5mm.  There was fluid in the endometrial cavity as well.  Endometrial biopsy was performed in the office on 03/16/2024.  This showed benign atrophic and inactive endometrial epithelial.  She also had a pap smear that was negative showing atrophy.      She is having genetic testing related to her children having osteopetrosis.  She's had a lot of guild with this.  Kids are 34, 13 and age 28.    H/o endometrial cancer in her sister.  She has two other sisters who had hysterectomies in their 53's.  She really wants a hysterectomy but we discussed that I just do not have a good reason to do this at this time.    Had pap smear 03/16/2024.    No LMP recorded. Patient is postmenopausal.  Past Medical History:  Diagnosis Date   Chronic headaches    Depression    Diastolic dysfunction    a. 12/2013 Echo: EF 60-65%, Gr1 DD, Ao sclerosis w/o stenosis, triv MR, mod dil LA, mild TR.   Diverticulosis    GERD (gastroesophageal reflux disease)    History of hiatal hernia    with repair   History of stress test    a. 10/2008 MV: Ef 75%, nl perfusion.   HTN (hypertension)    Hyperlipidemia    Migraine    Obesity    Osteopenia    PAF (paroxysmal atrial fibrillation) (HCC)    a. Anticoagulated w/ Eliquis  (CHA2DS2VASc = 3).    MEDS:  Reviewed in EPIC  ALLERGIES: Statins, Codeine, Erythromycin, Hctz [hydrochlorothiazide], and Lipitor [atorvastatin]  SH:  divorced,   Review of Systems  Constitutional: Negative.     PHYSICAL EXAMINATION:    BP 122/81 (BP Location: Left Arm, Patient Position: Sitting, Cuff Size: Normal)   Pulse 72   Ht 5' 4.5 (1.638 m)   Wt 171  lb 12.8 oz (77.9 kg)   SpO2 98%   BMI 29.03 kg/m     General appearance: alert, cooperative and appears stated age Lymph:  no inguinal LAD noted Pelvic: External genitalia:  no lesions              Urethra:  normal appearing urethra with no masses, tenderness or lesions              Bartholins and Skenes: normal                 Vagina: normal mucosa without prolapse or lesions, small cystocele              Cervix: no lesions              Bimanual Exam:  Uterus:  normal size, contour, position, consistency, mobility, non-tender              Adnexa: no mass, fullness, tenderness              Chaperone was present for exam.  Assessment/Plan: 1. Encounter for consultation (Primary) - discussed with pt I just do not have a reason to do a hysterectomy at this time.  She has undergone a tubal ligation and we discussed the reduction  in ovarian cancer risk.  Discussed considering repeat ultrasound at this time.  Offered consult with another surgeon for consult but she declines today.    2. Postmenopausal bleeding - she will return for this - US  PELVIS TRANSVAGINAL NON-OB (TV ONLY); Future  Time with pt:  33 minutes.

## 2024-09-07 ENCOUNTER — Other Ambulatory Visit: Payer: Self-pay | Admitting: Internal Medicine

## 2024-09-07 NOTE — Telephone Encounter (Signed)
 done

## 2024-09-07 NOTE — Telephone Encounter (Signed)
 Done

## 2024-10-12 ENCOUNTER — Other Ambulatory Visit: Payer: Self-pay | Admitting: Cardiology

## 2024-10-13 MED ORDER — METOPROLOL SUCCINATE ER 25 MG PO TB24: 25.0000 mg | ORAL_TABLET | Freq: Every day | ORAL | 0 refills | Status: DC

## 2024-10-17 NOTE — Progress Notes (Unsigned)
" ° °  GYNECOLOGY  VISIT  CC:   No chief complaint on file.   HPI: 74 y.o. G3P3 Divorced White or Caucasian female here for ultrasound follow up.  No LMP recorded. Patient is postmenopausal.  Past Medical History:  Diagnosis Date   Chronic headaches    Depression    Diastolic dysfunction    a. 12/2013 Echo: EF 60-65%, Gr1 DD, Ao sclerosis w/o stenosis, triv MR, mod dil LA, mild TR.   Diverticulosis    GERD (gastroesophageal reflux disease)    History of hiatal hernia    with repair   History of stress test    a. 10/2008 MV: Ef 75%, nl perfusion.   HTN (hypertension)    Hyperlipidemia    Migraine    Obesity    Osteopenia    PAF (paroxysmal atrial fibrillation) (HCC)    a. Anticoagulated w/ Eliquis  (CHA2DS2VASc = 3).    MEDS:  Reviewed in EPIC  ALLERGIES: Statins, Codeine, Erythromycin, Hctz [hydrochlorothiazide], and Lipitor [atorvastatin]  SH:  ***  ROS  PHYSICAL EXAMINATION:    There were no vitals taken for this visit.    General appearance: alert, cooperative and appears stated age Neck: no adenopathy, supple, symmetrical, trachea midline and thyroid  {CHL AMB PHY EX THYROID  NORM DEFAULT:[phone]::normal to inspection and palpation} CV:  {Exam; heart brief:31539} Lungs:  {pe lungs ob:314451} Breasts: {Exam; breast:13139::normal appearance, no masses or tenderness} Abdomen: soft, non-tender; bowel sounds normal; no masses,  no organomegaly Lymph:  no inguinal LAD noted  Pelvic: External genitalia:  no lesions              Urethra:  normal appearing urethra with no masses, tenderness or lesions              Bartholins and Skenes: normal                 Vagina: {exam; pelvic vaginal:30846}              Cervix: {CHL AMB PHY EX CERVIX NORM DEFAULT:[phone]::no lesions}              Bimanual Exam:  Uterus:  {CHL AMB PHY EX UTERUS NORM DEFAULT:[phone]::normal size, contour, position, consistency, mobility, non-tender}              Adnexa: {CHL AMB PHY EX  ADNEXA NO MASS [phone]::no mass, fullness, tenderness}              Rectovaginal: {yes no:[id number]}.  Confirms.              Anus:  normal sphincter tone, no lesions  Chaperone was present for exam.  Assessment/Plan: There are no diagnoses linked to this encounter.  "

## 2024-10-18 ENCOUNTER — Encounter (HOSPITAL_BASED_OUTPATIENT_CLINIC_OR_DEPARTMENT_OTHER): Payer: Self-pay

## 2024-10-18 ENCOUNTER — Other Ambulatory Visit (HOSPITAL_BASED_OUTPATIENT_CLINIC_OR_DEPARTMENT_OTHER)

## 2024-10-18 ENCOUNTER — Ambulatory Visit (HOSPITAL_BASED_OUTPATIENT_CLINIC_OR_DEPARTMENT_OTHER): Payer: Self-pay | Admitting: Obstetrics & Gynecology

## 2024-10-24 NOTE — Progress Notes (Unsigned)
 "    HPI: FU atrial fibrillation. Last echocardiogram April 2018 showed normal LV function, mild left ventricular hypertrophy, grade 1 diastolic dysfunction.  Nuclear study March 2019 showed ejection fraction 63% and probable shifting breast attenuation.  Monitor November 2022 showed sinus rhythm with PACs, brief PAT, rare PVC and couplet.  Patient triggered events associated with sinus rhythm.  Echocardiogram September 2024 showed normal LV function, basal septal hypertrophy, grade 1 diastolic dysfunction.  Since I last saw her,   Current Outpatient Medications  Medication Sig Dispense Refill   acetaminophen  (TYLENOL ) 500 MG tablet Take 500 mg by mouth every 6 (six) hours as needed.     ALPRAZolam  (XANAX ) 0.5 MG tablet TAKE 1 TABLET(0.5 MG) BY MOUTH TWICE DAILY AS NEEDED FOR ANXIETY 60 tablet 1   apixaban  (ELIQUIS ) 5 MG TABS tablet TAKE 1 TABLET BY MOUTH TWICE DAILY 60 tablet 5   diltiazem  (CARDIZEM  CD) 240 MG 24 hr capsule TAKE 1 CAPSULE(240 MG) BY MOUTH DAILY 90 capsule 1   metoprolol  succinate (TOPROL -XL) 25 MG 24 hr tablet Take 1 tablet (25 mg total) by mouth daily. 15 tablet 0   sertraline  (ZOLOFT ) 50 MG tablet TAKE 1 TABLET(50 MG) BY MOUTH DAILY 90 tablet 3   No current facility-administered medications for this visit.     Past Medical History:  Diagnosis Date   Chronic headaches    Depression    Diastolic dysfunction    a. 12/2013 Echo: EF 60-65%, Gr1 DD, Ao sclerosis w/o stenosis, triv MR, mod dil LA, mild TR.   Diverticulosis    GERD (gastroesophageal reflux disease)    History of hiatal hernia    with repair   History of stress test    a. 10/2008 MV: Ef 75%, nl perfusion.   HTN (hypertension)    Hyperlipidemia    Migraine    Obesity    Osteopenia    PAF (paroxysmal atrial fibrillation) (HCC)    a. Anticoagulated w/ Eliquis  (CHA2DS2VASc = 3).    Past Surgical History:  Procedure Laterality Date   CATARACT EXTRACTION, BILATERAL     COLONOSCOPY     ELBOW SURGERY  left    Tendon Surgery   ESOPHAGEAL MANOMETRY N/A 02/23/2018   Procedure: ESOPHAGEAL MANOMETRY (EM);  Surgeon: Shila Gustav GAILS, MD;  Location: [facility];  Service: Endoscopy;  Laterality: N/A;   EYE SURGERY Left    L-yey myopia   HERNIA REPAIR  2008   nissan fundiplication   KNEE ARTHROSCOPY  2005   R-knee   LAPAROSCOPIC NISSEN FUNDOPLICATION     ROTATOR CUFF REPAIR Right    TONSILLECTOMY     TUBAL LIGATION     UPPER GASTROINTESTINAL ENDOSCOPY      Social History   Socioeconomic History   Marital status: Divorced    Spouse name: Not on file   Number of children: 3   Years of education: Not on file   Highest education level: Some college, no degree  Occupational History   Occupation: Counsellor of Medicine    Comment: retired  Tobacco Use   Smoking status: Former    Current packs/day: 0.00    Types: Cigarettes    Quit date: 09/07/1971    Years since quitting: 53.1   Smokeless tobacco: Never  Vaping Use   Vaping status: Never Used  Substance and Sexual Activity   Alcohol use: Not Currently   Drug use: No   Sexual activity: Not Currently    Birth control/protection: Post-menopausal  Other Topics  Concern   Not on file  Social History Narrative   Divorced, 3 children lives alone   She is a retired librarian, 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[employer]   Former smoker no alcohol no drug use   Social Drivers of Health   Tobacco Use: Medium Risk (09/06/2024)   Patient History    Smoking Tobacco Use: Former    Smokeless Tobacco Use: Never    Passive Exposure: Not on Actuary Strain: Low Risk (07/20/2024)   Overall Financial Resource Strain (CARDIA)    Difficulty of Paying Living Expenses: Not hard at all  Food Insecurity: No Food Insecurity (07/20/2024)   Epic    Worried About Programme Researcher, Broadcasting/film/video in the Last Year: Never true    Ran Out of Food in the Last Year: Never true  Transportation Needs: No Transportation Needs (07/20/2024)   Epic     Lack of Transportation (Medical): No    Lack of Transportation (Non-Medical): No  Physical Activity: Unknown (07/20/2024)   Exercise Vital Sign    Days of Exercise per Week: Patient declined    Minutes of Exercise per Session: Not on file  Recent Concern: Physical Activity - Insufficiently Active (06/15/2024)   Exercise Vital Sign    Days of Exercise per Week: 1 day    Minutes of Exercise per Session: 20 min  Stress: Stress Concern Present (07/20/2024)   [employer] of Occupational Health - Occupational Stress Questionnaire    Feeling of Stress: Rather much  Social Connections: Moderately Integrated (07/20/2024)   Social Connection and Isolation Panel    Frequency of Communication with Friends and Family: More than three times a week    Frequency of Social Gatherings with Friends and Family: Three times a week    Attends Religious Services: More than 4 times per year    Active Member of Clubs or Organizations: Yes    Attends Banker Meetings: More than 4 times per year    Marital Status: Divorced  Intimate Partner Violence: Not At Risk (09/09/2022)   Humiliation, Afraid, Rape, and Kick questionnaire    Fear of Current or Ex-Partner: No    Emotionally Abused: No    Physically Abused: No    Sexually Abused: No  Depression (PHQ2-9): Low Risk (07/24/2024)   Depression (PHQ2-9)    PHQ-2 Score: 1  Alcohol Screen: Low Risk (09/09/2022)   Alcohol Screen    Last Alcohol Screening Score (AUDIT): 0  Housing: Low Risk (07/20/2024)   Epic    Unable to Pay for Housing in the Last Year: No    Number of Times Moved in the Last Year: 0    Homeless in the Last Year: No  Utilities: Not At Risk (03/25/2023)   AHC Utilities    Threatened with loss of utilities: No  Health Literacy: Not on file    Family History  Problem Relation Age of Onset   COPD Mother    Asthma Mother    Arthritis Father    Hypertension Father    Stroke Father    Cancer Father    Prostate  cancer Father    Hypertension Sister    Diabetes Sister    Hypertension Brother    Cancer Sister        UTERINE   Cancer Son        TESTICULAR   Crohn's disease Daughter    Pancreatic cancer Other    Colon cancer Cousin    Esophageal cancer Neg  Hx    Stomach cancer Neg Hx    Liver disease Neg Hx    Rectal cancer Neg Hx     ROS: no fevers or chills, productive cough, hemoptysis, dysphasia, odynophagia, melena, hematochezia, dysuria, hematuria, rash, seizure activity, orthopnea, PND, pedal edema, claudication. Remaining systems are negative.  Physical Exam: Well-developed well-nourished in no acute distress.  Skin is warm and dry.  HEENT is normal.  Neck is supple.  Chest is clear to auscultation with normal expansion.  Cardiovascular exam is regular rate and rhythm.  Abdominal exam nontender or distended. No masses palpated. Extremities show no edema. neuro grossly intact  ECG- personally reviewed  A/P  1 paroxysmal atrial fibrillation-patient remains in sinus rhythm.  Continue Cardizem , metoprolol  and apixaban  at present dose.  2 hypertension-patient's blood pressure is controlled.  Continue present medical regimen.  3 hyperlipidemia-continue statin.  4 chronic diastolic congestive heart failure-she is euvolemic.  Continue diuretic at present dose.  Redell Shallow, MD    "

## 2024-10-25 ENCOUNTER — Ambulatory Visit: Admitting: Cardiology

## 2024-10-25 ENCOUNTER — Telehealth: Payer: Self-pay | Admitting: Cardiology

## 2024-10-25 DIAGNOSIS — I480 Paroxysmal atrial fibrillation: Secondary | ICD-10-CM

## 2024-10-25 MED ORDER — APIXABAN 5 MG PO TABS: 5.0000 mg | ORAL_TABLET | Freq: Two times a day (BID) | ORAL | 0 refills | Status: AC

## 2024-10-25 MED ORDER — DILTIAZEM HCL ER COATED BEADS 240 MG PO CP24: 240.0000 mg | ORAL_CAPSULE | Freq: Every day | ORAL | 0 refills | Status: AC

## 2024-10-25 NOTE — Telephone Encounter (Signed)
 Prescription refill request for Eliquis  received. Indication: AFIB Last office visit: 04/2023 Scr: 0.84 (06/2024) Age: 74 Weight: 77.9 KG  Sent in enough refills until appointment 12/12/24

## 2024-10-25 NOTE — Telephone Encounter (Signed)
 Pt is concerned about her cholesterol levels, they are high since she discontinued rosuvastatin . Please advise.

## 2024-10-25 NOTE — Telephone Encounter (Signed)
 Left message for patient that dr pietro agrees with the appointment with the NP to discuss lipids. Also aware, can see pharmacist if she would like to do that. She is to call back with questions.

## 2024-10-25 NOTE — Telephone Encounter (Signed)
" °*  STAT* If patient is at the pharmacy, call can be transferred to refill team.   1. Which medications need to be refilled? (please list name of each medication and dose if known) metoprolol  succinate (TOPROL -XL) 25 MG 24 hr tablet   diltiazem  (CARDIZEM  CD) 240 MG 24 hr capsule  apixaban  (ELIQUIS ) 5 MG TABS tablet   2. Would you like to learn more about the convenience, safety, & potential cost savings by using the [facility]? No     3. Are you open to using the [facility] (Type [facility]. No    4. Which pharmacy/location (including street and city if local pharmacy) is medication to be sent to? [id number] - [place], [place] - [address] AT [facility] [address]     5. Do they need a 30 day or 90 day supply? 90 day   Pt has appt scheduled 3/17 "

## 2024-10-27 ENCOUNTER — Other Ambulatory Visit: Payer: Self-pay | Admitting: Cardiology

## 2024-10-27 NOTE — Telephone Encounter (Signed)
" °*  STAT* If patient is at the pharmacy, call can be transferred to refill team.   1. Which medications need to be refilled? (please list name of each medication and dose if known)   metoprolol  succinate (TOPROL -XL) 25 MG 24 hr tablet   2. Would you like to learn more about the convenience, safety, & potential cost savings by using the [facility]?   3. Are you open to using the [facility] (Type [facility]. ).  4. Which pharmacy/location (including street and city if local pharmacy) is medication to be sent to?  [id number] - [place], [place] - [address] AT [facility] [address]   5. Do they need a 30 day or 90 day supply?   90 day  Patient stated she only has 4 tablets left.   Patient has appointment scheduled with K. Devora, NP on 3/17.  Patient wants a call back to confirm the dosage. "

## 2024-10-30 MED ORDER — METOPROLOL SUCCINATE ER 25 MG PO TB24: 25.0000 mg | ORAL_TABLET | Freq: Every day | ORAL | 0 refills | Status: AC

## 2024-10-30 NOTE — Telephone Encounter (Signed)
Refilled until appointment in March.

## 2024-11-08 ENCOUNTER — Other Ambulatory Visit (HOSPITAL_BASED_OUTPATIENT_CLINIC_OR_DEPARTMENT_OTHER)

## 2024-11-08 ENCOUNTER — Other Ambulatory Visit (HOSPITAL_BASED_OUTPATIENT_CLINIC_OR_DEPARTMENT_OTHER): Payer: Self-pay | Admitting: Obstetrics & Gynecology

## 2024-12-12 ENCOUNTER — Ambulatory Visit: Admitting: Cardiology
